# Patient Record
Sex: Male | Born: 1940
Health system: Southern US, Community
[De-identification: ages and names within clinical notes are randomized; demographics above are authoritative.]

## PROBLEM LIST (undated history)

## (undated) DIAGNOSIS — G629 Polyneuropathy, unspecified: Secondary | ICD-10-CM

## (undated) DIAGNOSIS — R011 Cardiac murmur, unspecified: Secondary | ICD-10-CM

## (undated) DIAGNOSIS — D638 Anemia in other chronic diseases classified elsewhere: Secondary | ICD-10-CM

## (undated) DIAGNOSIS — J329 Chronic sinusitis, unspecified: Secondary | ICD-10-CM

## (undated) DIAGNOSIS — E1165 Type 2 diabetes mellitus with hyperglycemia: Secondary | ICD-10-CM

## (undated) DIAGNOSIS — E669 Obesity, unspecified: Secondary | ICD-10-CM

## (undated) DIAGNOSIS — E213 Hyperparathyroidism, unspecified: Secondary | ICD-10-CM

## (undated) DIAGNOSIS — I5030 Unspecified diastolic (congestive) heart failure: Secondary | ICD-10-CM

## (undated) DIAGNOSIS — N189 Chronic kidney disease, unspecified: Secondary | ICD-10-CM

## (undated) DIAGNOSIS — IMO0002 Reserved for concepts with insufficient information to code with codable children: Secondary | ICD-10-CM

## (undated) DIAGNOSIS — I10 Essential (primary) hypertension: Secondary | ICD-10-CM

## (undated) HISTORY — DX: Chronic sinusitis, unspecified: J32.9

## (undated) HISTORY — DX: Polyneuropathy, unspecified: G62.9

## (undated) HISTORY — DX: Anemia in other chronic diseases classified elsewhere: D63.8

## (undated) HISTORY — DX: Obesity, unspecified: E66.9

## (undated) HISTORY — PX: ANKLE SURGERY: SHX546

## (undated) HISTORY — DX: Unspecified diastolic (congestive) heart failure: I50.30

## (undated) HISTORY — DX: Essential (primary) hypertension: I10

## (undated) HISTORY — DX: Hyperparathyroidism, unspecified: E21.3

## (undated) HISTORY — PX: NASAL SINUS SURGERY: SHX719

## (undated) HISTORY — DX: Type 2 diabetes mellitus with hyperglycemia: E11.65

## (undated) HISTORY — PX: VASECTOMY: SHX75

## (undated) HISTORY — DX: Chronic kidney disease, unspecified: N18.9

## (undated) HISTORY — DX: Reserved for concepts with insufficient information to code with codable children: IMO0002

---

## 2011-09-23 DIAGNOSIS — H903 Sensorineural hearing loss, bilateral: Secondary | ICD-10-CM | POA: Diagnosis not present

## 2011-09-23 DIAGNOSIS — H9209 Otalgia, unspecified ear: Secondary | ICD-10-CM | POA: Diagnosis not present

## 2011-09-26 ENCOUNTER — Other Ambulatory Visit: Payer: Self-pay | Admitting: Otolaryngology

## 2011-10-01 ENCOUNTER — Ambulatory Visit
Admission: RE | Admit: 2011-10-01 | Discharge: 2011-10-01 | Disposition: A | Discharge status: Home / Self Care | Payer: Medicare Other | Source: Ambulatory Visit | Attending: Otolaryngology | Admitting: Otolaryngology

## 2011-10-01 DIAGNOSIS — H9190 Unspecified hearing loss, unspecified ear: Secondary | ICD-10-CM | POA: Diagnosis not present

## 2011-10-01 DIAGNOSIS — H9209 Otalgia, unspecified ear: Secondary | ICD-10-CM | POA: Diagnosis not present

## 2011-10-01 MED ORDER — IOHEXOL 300 MG/ML  SOLN
50.0000 mL | Freq: Once | INTRAMUSCULAR | Status: AC | PRN
  Administered 2011-10-01: 50 mL via INTRAVENOUS

## 2011-10-14 DIAGNOSIS — J309 Allergic rhinitis, unspecified: Secondary | ICD-10-CM | POA: Diagnosis not present

## 2011-10-14 DIAGNOSIS — H9209 Otalgia, unspecified ear: Secondary | ICD-10-CM | POA: Diagnosis not present

## 2011-10-14 DIAGNOSIS — H905 Unspecified sensorineural hearing loss: Secondary | ICD-10-CM | POA: Diagnosis not present

## 2011-10-14 DIAGNOSIS — K111 Hypertrophy of salivary gland: Secondary | ICD-10-CM | POA: Diagnosis not present

## 2011-10-21 DIAGNOSIS — H903 Sensorineural hearing loss, bilateral: Secondary | ICD-10-CM | POA: Diagnosis not present

## 2011-10-21 DIAGNOSIS — H9209 Otalgia, unspecified ear: Secondary | ICD-10-CM | POA: Diagnosis not present

## 2011-10-21 DIAGNOSIS — H8109 Meniere's disease, unspecified ear: Secondary | ICD-10-CM | POA: Diagnosis not present

## 2011-10-21 DIAGNOSIS — K119 Disease of salivary gland, unspecified: Secondary | ICD-10-CM | POA: Diagnosis not present

## 2011-11-25 DIAGNOSIS — K119 Disease of salivary gland, unspecified: Secondary | ICD-10-CM | POA: Diagnosis not present

## 2011-11-25 DIAGNOSIS — H8109 Meniere's disease, unspecified ear: Secondary | ICD-10-CM | POA: Diagnosis not present

## 2011-11-25 DIAGNOSIS — H903 Sensorineural hearing loss, bilateral: Secondary | ICD-10-CM | POA: Diagnosis not present

## 2011-11-25 DIAGNOSIS — H9209 Otalgia, unspecified ear: Secondary | ICD-10-CM | POA: Diagnosis not present

## 2012-01-05 DIAGNOSIS — K111 Hypertrophy of salivary gland: Secondary | ICD-10-CM | POA: Diagnosis not present

## 2012-01-05 DIAGNOSIS — J309 Allergic rhinitis, unspecified: Secondary | ICD-10-CM | POA: Diagnosis not present

## 2012-01-19 DIAGNOSIS — S8990XA Unspecified injury of unspecified lower leg, initial encounter: Secondary | ICD-10-CM | POA: Diagnosis not present

## 2012-01-19 DIAGNOSIS — I5030 Unspecified diastolic (congestive) heart failure: Secondary | ICD-10-CM | POA: Diagnosis not present

## 2012-01-19 DIAGNOSIS — K560 Paralytic ileus: Secondary | ICD-10-CM | POA: Diagnosis not present

## 2012-01-19 DIAGNOSIS — N179 Acute kidney failure, unspecified: Secondary | ICD-10-CM | POA: Diagnosis not present

## 2012-01-19 DIAGNOSIS — W010XXA Fall on same level from slipping, tripping and stumbling without subsequent striking against object, initial encounter: Secondary | ICD-10-CM | POA: Diagnosis not present

## 2012-01-19 DIAGNOSIS — J189 Pneumonia, unspecified organism: Secondary | ICD-10-CM | POA: Diagnosis not present

## 2012-01-19 DIAGNOSIS — I509 Heart failure, unspecified: Secondary | ICD-10-CM | POA: Diagnosis not present

## 2012-01-19 DIAGNOSIS — S82853A Displaced trimalleolar fracture of unspecified lower leg, initial encounter for closed fracture: Secondary | ICD-10-CM | POA: Diagnosis not present

## 2012-01-19 DIAGNOSIS — J988 Other specified respiratory disorders: Secondary | ICD-10-CM | POA: Diagnosis not present

## 2012-01-19 DIAGNOSIS — I130 Hypertensive heart and chronic kidney disease with heart failure and stage 1 through stage 4 chronic kidney disease, or unspecified chronic kidney disease: Secondary | ICD-10-CM | POA: Diagnosis not present

## 2012-01-19 DIAGNOSIS — T148XXA Other injury of unspecified body region, initial encounter: Secondary | ICD-10-CM | POA: Diagnosis not present

## 2012-01-20 DIAGNOSIS — N179 Acute kidney failure, unspecified: Secondary | ICD-10-CM | POA: Diagnosis present

## 2012-01-20 DIAGNOSIS — I130 Hypertensive heart and chronic kidney disease with heart failure and stage 1 through stage 4 chronic kidney disease, or unspecified chronic kidney disease: Secondary | ICD-10-CM | POA: Diagnosis present

## 2012-01-20 DIAGNOSIS — R748 Abnormal levels of other serum enzymes: Secondary | ICD-10-CM | POA: Diagnosis present

## 2012-01-20 DIAGNOSIS — I359 Nonrheumatic aortic valve disorder, unspecified: Secondary | ICD-10-CM | POA: Diagnosis not present

## 2012-01-20 DIAGNOSIS — G500 Trigeminal neuralgia: Secondary | ICD-10-CM | POA: Diagnosis present

## 2012-01-20 DIAGNOSIS — E876 Hypokalemia: Secondary | ICD-10-CM | POA: Diagnosis not present

## 2012-01-20 DIAGNOSIS — A419 Sepsis, unspecified organism: Secondary | ICD-10-CM | POA: Diagnosis not present

## 2012-01-20 DIAGNOSIS — E785 Hyperlipidemia, unspecified: Secondary | ICD-10-CM | POA: Diagnosis not present

## 2012-01-20 DIAGNOSIS — R5383 Other fatigue: Secondary | ICD-10-CM | POA: Diagnosis not present

## 2012-01-20 DIAGNOSIS — R197 Diarrhea, unspecified: Secondary | ICD-10-CM | POA: Diagnosis not present

## 2012-01-20 DIAGNOSIS — IMO0001 Reserved for inherently not codable concepts without codable children: Secondary | ICD-10-CM | POA: Diagnosis not present

## 2012-01-20 DIAGNOSIS — I5030 Unspecified diastolic (congestive) heart failure: Secondary | ICD-10-CM | POA: Diagnosis present

## 2012-01-20 DIAGNOSIS — K929 Disease of digestive system, unspecified: Secondary | ICD-10-CM | POA: Diagnosis not present

## 2012-01-20 DIAGNOSIS — S9306XA Dislocation of unspecified ankle joint, initial encounter: Secondary | ICD-10-CM | POA: Diagnosis not present

## 2012-01-20 DIAGNOSIS — I5031 Acute diastolic (congestive) heart failure: Secondary | ICD-10-CM | POA: Diagnosis not present

## 2012-01-20 DIAGNOSIS — I509 Heart failure, unspecified: Secondary | ICD-10-CM | POA: Diagnosis not present

## 2012-01-20 DIAGNOSIS — W010XXA Fall on same level from slipping, tripping and stumbling without subsequent striking against object, initial encounter: Secondary | ICD-10-CM | POA: Diagnosis not present

## 2012-01-20 DIAGNOSIS — N189 Chronic kidney disease, unspecified: Secondary | ICD-10-CM | POA: Diagnosis not present

## 2012-01-20 DIAGNOSIS — Z0389 Encounter for observation for other suspected diseases and conditions ruled out: Secondary | ICD-10-CM | POA: Diagnosis not present

## 2012-01-20 DIAGNOSIS — I501 Left ventricular failure: Secondary | ICD-10-CM | POA: Diagnosis not present

## 2012-01-20 DIAGNOSIS — J189 Pneumonia, unspecified organism: Secondary | ICD-10-CM | POA: Diagnosis not present

## 2012-01-20 DIAGNOSIS — S82853A Displaced trimalleolar fracture of unspecified lower leg, initial encounter for closed fracture: Secondary | ICD-10-CM | POA: Diagnosis not present

## 2012-01-20 DIAGNOSIS — S82899A Other fracture of unspecified lower leg, initial encounter for closed fracture: Secondary | ICD-10-CM | POA: Diagnosis not present

## 2012-01-20 DIAGNOSIS — K5909 Other constipation: Secondary | ICD-10-CM | POA: Diagnosis present

## 2012-01-20 DIAGNOSIS — I119 Hypertensive heart disease without heart failure: Secondary | ICD-10-CM | POA: Diagnosis not present

## 2012-01-20 DIAGNOSIS — I10 Essential (primary) hypertension: Secondary | ICD-10-CM | POA: Diagnosis not present

## 2012-01-20 DIAGNOSIS — R509 Fever, unspecified: Secondary | ICD-10-CM | POA: Diagnosis not present

## 2012-01-20 DIAGNOSIS — R269 Unspecified abnormalities of gait and mobility: Secondary | ICD-10-CM | POA: Diagnosis not present

## 2012-01-20 DIAGNOSIS — J988 Other specified respiratory disorders: Secondary | ICD-10-CM | POA: Diagnosis not present

## 2012-01-20 DIAGNOSIS — M25579 Pain in unspecified ankle and joints of unspecified foot: Secondary | ICD-10-CM | POA: Diagnosis not present

## 2012-01-20 DIAGNOSIS — I369 Nonrheumatic tricuspid valve disorder, unspecified: Secondary | ICD-10-CM | POA: Diagnosis not present

## 2012-01-20 DIAGNOSIS — S1190XA Unspecified open wound of unspecified part of neck, initial encounter: Secondary | ICD-10-CM | POA: Diagnosis not present

## 2012-01-20 DIAGNOSIS — J158 Pneumonia due to other specified bacteria: Secondary | ICD-10-CM | POA: Diagnosis not present

## 2012-01-20 DIAGNOSIS — N183 Chronic kidney disease, stage 3 unspecified: Secondary | ICD-10-CM | POA: Diagnosis not present

## 2012-01-20 DIAGNOSIS — Z8249 Family history of ischemic heart disease and other diseases of the circulatory system: Secondary | ICD-10-CM | POA: Diagnosis not present

## 2012-01-20 DIAGNOSIS — K560 Paralytic ileus: Secondary | ICD-10-CM | POA: Diagnosis not present

## 2012-01-20 HISTORY — PX: ANKLE SURGERY: SHX546

## 2012-01-25 DIAGNOSIS — I10 Essential (primary) hypertension: Secondary | ICD-10-CM | POA: Diagnosis not present

## 2012-01-25 DIAGNOSIS — M25579 Pain in unspecified ankle and joints of unspecified foot: Secondary | ICD-10-CM | POA: Diagnosis not present

## 2012-01-25 DIAGNOSIS — J158 Pneumonia due to other specified bacteria: Secondary | ICD-10-CM | POA: Diagnosis not present

## 2012-01-25 DIAGNOSIS — I119 Hypertensive heart disease without heart failure: Secondary | ICD-10-CM | POA: Diagnosis not present

## 2012-01-25 DIAGNOSIS — E7889 Other lipoprotein metabolism disorders: Secondary | ICD-10-CM | POA: Diagnosis not present

## 2012-01-25 DIAGNOSIS — G500 Trigeminal neuralgia: Secondary | ICD-10-CM | POA: Diagnosis not present

## 2012-01-25 DIAGNOSIS — I5031 Acute diastolic (congestive) heart failure: Secondary | ICD-10-CM | POA: Diagnosis not present

## 2012-01-25 DIAGNOSIS — N19 Unspecified kidney failure: Secondary | ICD-10-CM | POA: Diagnosis not present

## 2012-01-25 DIAGNOSIS — E7800 Pure hypercholesterolemia, unspecified: Secondary | ICD-10-CM | POA: Diagnosis not present

## 2012-01-25 DIAGNOSIS — E785 Hyperlipidemia, unspecified: Secondary | ICD-10-CM | POA: Diagnosis not present

## 2012-01-25 DIAGNOSIS — S82853A Displaced trimalleolar fracture of unspecified lower leg, initial encounter for closed fracture: Secondary | ICD-10-CM | POA: Diagnosis not present

## 2012-01-25 DIAGNOSIS — I5030 Unspecified diastolic (congestive) heart failure: Secondary | ICD-10-CM | POA: Diagnosis not present

## 2012-01-25 DIAGNOSIS — J189 Pneumonia, unspecified organism: Secondary | ICD-10-CM | POA: Diagnosis not present

## 2012-01-25 DIAGNOSIS — E876 Hypokalemia: Secondary | ICD-10-CM | POA: Diagnosis not present

## 2012-01-25 DIAGNOSIS — R5383 Other fatigue: Secondary | ICD-10-CM | POA: Diagnosis not present

## 2012-01-25 DIAGNOSIS — IMO0001 Reserved for inherently not codable concepts without codable children: Secondary | ICD-10-CM | POA: Diagnosis not present

## 2012-01-25 DIAGNOSIS — R269 Unspecified abnormalities of gait and mobility: Secondary | ICD-10-CM | POA: Diagnosis not present

## 2012-01-26 DIAGNOSIS — I10 Essential (primary) hypertension: Secondary | ICD-10-CM | POA: Diagnosis not present

## 2012-01-26 DIAGNOSIS — J189 Pneumonia, unspecified organism: Secondary | ICD-10-CM | POA: Diagnosis not present

## 2012-01-26 DIAGNOSIS — E7800 Pure hypercholesterolemia, unspecified: Secondary | ICD-10-CM | POA: Diagnosis not present

## 2012-01-26 DIAGNOSIS — N19 Unspecified kidney failure: Secondary | ICD-10-CM | POA: Diagnosis not present

## 2012-02-11 DIAGNOSIS — S82853A Displaced trimalleolar fracture of unspecified lower leg, initial encounter for closed fracture: Secondary | ICD-10-CM | POA: Diagnosis not present

## 2012-02-26 DIAGNOSIS — IMO0001 Reserved for inherently not codable concepts without codable children: Secondary | ICD-10-CM | POA: Diagnosis not present

## 2012-02-26 DIAGNOSIS — I509 Heart failure, unspecified: Secondary | ICD-10-CM | POA: Diagnosis not present

## 2012-02-26 DIAGNOSIS — E785 Hyperlipidemia, unspecified: Secondary | ICD-10-CM | POA: Diagnosis not present

## 2012-02-26 DIAGNOSIS — I129 Hypertensive chronic kidney disease with stage 1 through stage 4 chronic kidney disease, or unspecified chronic kidney disease: Secondary | ICD-10-CM | POA: Diagnosis not present

## 2012-03-01 DIAGNOSIS — I129 Hypertensive chronic kidney disease with stage 1 through stage 4 chronic kidney disease, or unspecified chronic kidney disease: Secondary | ICD-10-CM | POA: Diagnosis not present

## 2012-03-01 DIAGNOSIS — E785 Hyperlipidemia, unspecified: Secondary | ICD-10-CM | POA: Diagnosis not present

## 2012-03-01 DIAGNOSIS — I509 Heart failure, unspecified: Secondary | ICD-10-CM | POA: Diagnosis not present

## 2012-03-01 DIAGNOSIS — IMO0001 Reserved for inherently not codable concepts without codable children: Secondary | ICD-10-CM | POA: Diagnosis not present

## 2012-03-03 DIAGNOSIS — I5030 Unspecified diastolic (congestive) heart failure: Secondary | ICD-10-CM | POA: Diagnosis not present

## 2012-03-03 DIAGNOSIS — N289 Disorder of kidney and ureter, unspecified: Secondary | ICD-10-CM | POA: Diagnosis not present

## 2012-03-03 DIAGNOSIS — E7800 Pure hypercholesterolemia, unspecified: Secondary | ICD-10-CM | POA: Diagnosis not present

## 2012-03-03 DIAGNOSIS — S82853A Displaced trimalleolar fracture of unspecified lower leg, initial encounter for closed fracture: Secondary | ICD-10-CM | POA: Diagnosis not present

## 2012-03-03 DIAGNOSIS — I10 Essential (primary) hypertension: Secondary | ICD-10-CM | POA: Diagnosis not present

## 2012-03-04 DIAGNOSIS — E785 Hyperlipidemia, unspecified: Secondary | ICD-10-CM | POA: Diagnosis not present

## 2012-03-04 DIAGNOSIS — IMO0001 Reserved for inherently not codable concepts without codable children: Secondary | ICD-10-CM | POA: Diagnosis not present

## 2012-03-04 DIAGNOSIS — I509 Heart failure, unspecified: Secondary | ICD-10-CM | POA: Diagnosis not present

## 2012-03-04 DIAGNOSIS — I129 Hypertensive chronic kidney disease with stage 1 through stage 4 chronic kidney disease, or unspecified chronic kidney disease: Secondary | ICD-10-CM | POA: Diagnosis not present

## 2012-03-08 DIAGNOSIS — I509 Heart failure, unspecified: Secondary | ICD-10-CM | POA: Diagnosis not present

## 2012-03-08 DIAGNOSIS — I129 Hypertensive chronic kidney disease with stage 1 through stage 4 chronic kidney disease, or unspecified chronic kidney disease: Secondary | ICD-10-CM | POA: Diagnosis not present

## 2012-03-09 DIAGNOSIS — M25579 Pain in unspecified ankle and joints of unspecified foot: Secondary | ICD-10-CM | POA: Diagnosis not present

## 2012-03-10 DIAGNOSIS — Z79899 Other long term (current) drug therapy: Secondary | ICD-10-CM | POA: Diagnosis not present

## 2012-03-12 DIAGNOSIS — M25579 Pain in unspecified ankle and joints of unspecified foot: Secondary | ICD-10-CM | POA: Diagnosis not present

## 2012-03-16 DIAGNOSIS — M25579 Pain in unspecified ankle and joints of unspecified foot: Secondary | ICD-10-CM | POA: Diagnosis not present

## 2012-03-19 DIAGNOSIS — M25579 Pain in unspecified ankle and joints of unspecified foot: Secondary | ICD-10-CM | POA: Diagnosis not present

## 2012-03-23 DIAGNOSIS — D649 Anemia, unspecified: Secondary | ICD-10-CM | POA: Diagnosis not present

## 2012-03-23 DIAGNOSIS — M25579 Pain in unspecified ankle and joints of unspecified foot: Secondary | ICD-10-CM | POA: Diagnosis not present

## 2012-03-23 DIAGNOSIS — N2581 Secondary hyperparathyroidism of renal origin: Secondary | ICD-10-CM | POA: Diagnosis not present

## 2012-03-26 DIAGNOSIS — M25579 Pain in unspecified ankle and joints of unspecified foot: Secondary | ICD-10-CM | POA: Diagnosis not present

## 2012-03-30 DIAGNOSIS — M25579 Pain in unspecified ankle and joints of unspecified foot: Secondary | ICD-10-CM | POA: Diagnosis not present

## 2012-03-31 DIAGNOSIS — S82853A Displaced trimalleolar fracture of unspecified lower leg, initial encounter for closed fracture: Secondary | ICD-10-CM | POA: Diagnosis not present

## 2012-04-01 DIAGNOSIS — M25579 Pain in unspecified ankle and joints of unspecified foot: Secondary | ICD-10-CM | POA: Diagnosis not present

## 2012-04-05 DIAGNOSIS — H903 Sensorineural hearing loss, bilateral: Secondary | ICD-10-CM | POA: Diagnosis not present

## 2012-04-05 DIAGNOSIS — H9209 Otalgia, unspecified ear: Secondary | ICD-10-CM | POA: Diagnosis not present

## 2012-04-05 DIAGNOSIS — H8109 Meniere's disease, unspecified ear: Secondary | ICD-10-CM | POA: Diagnosis not present

## 2012-04-06 DIAGNOSIS — M25579 Pain in unspecified ankle and joints of unspecified foot: Secondary | ICD-10-CM | POA: Diagnosis not present

## 2012-04-09 DIAGNOSIS — M25579 Pain in unspecified ankle and joints of unspecified foot: Secondary | ICD-10-CM | POA: Diagnosis not present

## 2012-04-13 DIAGNOSIS — M25579 Pain in unspecified ankle and joints of unspecified foot: Secondary | ICD-10-CM | POA: Diagnosis not present

## 2012-04-16 DIAGNOSIS — M25579 Pain in unspecified ankle and joints of unspecified foot: Secondary | ICD-10-CM | POA: Diagnosis not present

## 2012-04-26 DIAGNOSIS — M25579 Pain in unspecified ankle and joints of unspecified foot: Secondary | ICD-10-CM | POA: Diagnosis not present

## 2012-04-27 DIAGNOSIS — Z79899 Other long term (current) drug therapy: Secondary | ICD-10-CM | POA: Diagnosis not present

## 2012-04-29 DIAGNOSIS — M25579 Pain in unspecified ankle and joints of unspecified foot: Secondary | ICD-10-CM | POA: Diagnosis not present

## 2012-05-04 DIAGNOSIS — M25579 Pain in unspecified ankle and joints of unspecified foot: Secondary | ICD-10-CM | POA: Diagnosis not present

## 2012-05-07 DIAGNOSIS — M25579 Pain in unspecified ankle and joints of unspecified foot: Secondary | ICD-10-CM | POA: Diagnosis not present

## 2012-05-10 DIAGNOSIS — R609 Edema, unspecified: Secondary | ICD-10-CM | POA: Diagnosis not present

## 2012-05-10 DIAGNOSIS — E7800 Pure hypercholesterolemia, unspecified: Secondary | ICD-10-CM | POA: Diagnosis not present

## 2012-05-10 DIAGNOSIS — E559 Vitamin D deficiency, unspecified: Secondary | ICD-10-CM | POA: Diagnosis not present

## 2012-05-10 DIAGNOSIS — M949 Disorder of cartilage, unspecified: Secondary | ICD-10-CM | POA: Diagnosis not present

## 2012-05-10 DIAGNOSIS — I10 Essential (primary) hypertension: Secondary | ICD-10-CM | POA: Diagnosis not present

## 2012-05-10 DIAGNOSIS — S82899A Other fracture of unspecified lower leg, initial encounter for closed fracture: Secondary | ICD-10-CM | POA: Diagnosis not present

## 2012-05-10 DIAGNOSIS — Z780 Asymptomatic menopausal state: Secondary | ICD-10-CM | POA: Diagnosis not present

## 2012-05-13 DIAGNOSIS — M25579 Pain in unspecified ankle and joints of unspecified foot: Secondary | ICD-10-CM | POA: Diagnosis not present

## 2012-05-20 DIAGNOSIS — M25579 Pain in unspecified ankle and joints of unspecified foot: Secondary | ICD-10-CM | POA: Diagnosis not present

## 2012-05-24 DIAGNOSIS — R609 Edema, unspecified: Secondary | ICD-10-CM | POA: Diagnosis not present

## 2012-05-24 DIAGNOSIS — N189 Chronic kidney disease, unspecified: Secondary | ICD-10-CM | POA: Diagnosis not present

## 2012-05-24 DIAGNOSIS — I519 Heart disease, unspecified: Secondary | ICD-10-CM | POA: Diagnosis not present

## 2012-05-24 DIAGNOSIS — E785 Hyperlipidemia, unspecified: Secondary | ICD-10-CM | POA: Diagnosis not present

## 2012-05-27 DIAGNOSIS — M25579 Pain in unspecified ankle and joints of unspecified foot: Secondary | ICD-10-CM | POA: Diagnosis not present

## 2012-05-31 DIAGNOSIS — I129 Hypertensive chronic kidney disease with stage 1 through stage 4 chronic kidney disease, or unspecified chronic kidney disease: Secondary | ICD-10-CM | POA: Diagnosis not present

## 2012-05-31 DIAGNOSIS — D631 Anemia in chronic kidney disease: Secondary | ICD-10-CM | POA: Diagnosis not present

## 2012-06-03 DIAGNOSIS — M25579 Pain in unspecified ankle and joints of unspecified foot: Secondary | ICD-10-CM | POA: Diagnosis not present

## 2012-06-07 DIAGNOSIS — H903 Sensorineural hearing loss, bilateral: Secondary | ICD-10-CM | POA: Diagnosis not present

## 2012-06-07 DIAGNOSIS — H8109 Meniere's disease, unspecified ear: Secondary | ICD-10-CM | POA: Diagnosis not present

## 2012-06-07 DIAGNOSIS — H9209 Otalgia, unspecified ear: Secondary | ICD-10-CM | POA: Diagnosis not present

## 2012-06-07 DIAGNOSIS — Z23 Encounter for immunization: Secondary | ICD-10-CM | POA: Diagnosis not present

## 2012-06-08 DIAGNOSIS — M25579 Pain in unspecified ankle and joints of unspecified foot: Secondary | ICD-10-CM | POA: Diagnosis not present

## 2012-06-28 DIAGNOSIS — S82853A Displaced trimalleolar fracture of unspecified lower leg, initial encounter for closed fracture: Secondary | ICD-10-CM | POA: Diagnosis not present

## 2012-07-13 DIAGNOSIS — N189 Chronic kidney disease, unspecified: Secondary | ICD-10-CM | POA: Diagnosis not present

## 2012-07-13 DIAGNOSIS — I10 Essential (primary) hypertension: Secondary | ICD-10-CM | POA: Diagnosis not present

## 2012-07-15 DIAGNOSIS — N189 Chronic kidney disease, unspecified: Secondary | ICD-10-CM | POA: Diagnosis not present

## 2012-07-27 DIAGNOSIS — E785 Hyperlipidemia, unspecified: Secondary | ICD-10-CM | POA: Diagnosis not present

## 2012-07-27 DIAGNOSIS — I10 Essential (primary) hypertension: Secondary | ICD-10-CM | POA: Diagnosis not present

## 2012-08-02 DIAGNOSIS — M79609 Pain in unspecified limb: Secondary | ICD-10-CM | POA: Diagnosis not present

## 2012-08-02 DIAGNOSIS — H9209 Otalgia, unspecified ear: Secondary | ICD-10-CM | POA: Diagnosis not present

## 2012-09-06 DIAGNOSIS — H2510 Age-related nuclear cataract, unspecified eye: Secondary | ICD-10-CM | POA: Diagnosis not present

## 2012-09-13 DIAGNOSIS — I10 Essential (primary) hypertension: Secondary | ICD-10-CM | POA: Diagnosis not present

## 2012-09-20 DIAGNOSIS — I10 Essential (primary) hypertension: Secondary | ICD-10-CM | POA: Diagnosis not present

## 2012-11-08 DIAGNOSIS — R609 Edema, unspecified: Secondary | ICD-10-CM | POA: Diagnosis not present

## 2012-11-08 DIAGNOSIS — I10 Essential (primary) hypertension: Secondary | ICD-10-CM | POA: Diagnosis not present

## 2012-11-08 DIAGNOSIS — N189 Chronic kidney disease, unspecified: Secondary | ICD-10-CM | POA: Diagnosis not present

## 2012-11-08 DIAGNOSIS — E785 Hyperlipidemia, unspecified: Secondary | ICD-10-CM | POA: Diagnosis not present

## 2012-11-15 DIAGNOSIS — L570 Actinic keratosis: Secondary | ICD-10-CM | POA: Diagnosis not present

## 2012-11-15 DIAGNOSIS — L821 Other seborrheic keratosis: Secondary | ICD-10-CM | POA: Diagnosis not present

## 2012-12-03 DIAGNOSIS — J0100 Acute maxillary sinusitis, unspecified: Secondary | ICD-10-CM | POA: Diagnosis not present

## 2013-01-10 DIAGNOSIS — L570 Actinic keratosis: Secondary | ICD-10-CM | POA: Diagnosis not present

## 2013-01-10 DIAGNOSIS — L821 Other seborrheic keratosis: Secondary | ICD-10-CM | POA: Diagnosis not present

## 2013-01-10 DIAGNOSIS — L578 Other skin changes due to chronic exposure to nonionizing radiation: Secondary | ICD-10-CM | POA: Diagnosis not present

## 2013-01-31 DIAGNOSIS — M25579 Pain in unspecified ankle and joints of unspecified foot: Secondary | ICD-10-CM | POA: Diagnosis not present

## 2013-01-31 DIAGNOSIS — E7800 Pure hypercholesterolemia, unspecified: Secondary | ICD-10-CM | POA: Diagnosis not present

## 2013-01-31 DIAGNOSIS — I10 Essential (primary) hypertension: Secondary | ICD-10-CM | POA: Diagnosis not present

## 2013-01-31 DIAGNOSIS — Z0000 Encounter for general adult medical examination without abnormal findings: Secondary | ICD-10-CM | POA: Diagnosis not present

## 2013-01-31 DIAGNOSIS — D649 Anemia, unspecified: Secondary | ICD-10-CM | POA: Diagnosis not present

## 2013-04-25 DIAGNOSIS — T8489XA Other specified complication of internal orthopedic prosthetic devices, implants and grafts, initial encounter: Secondary | ICD-10-CM | POA: Diagnosis not present

## 2013-05-10 DIAGNOSIS — Z8349 Family history of other endocrine, nutritional and metabolic diseases: Secondary | ICD-10-CM | POA: Diagnosis not present

## 2013-05-10 DIAGNOSIS — Z130 Encounter for screening for diseases of the blood and blood-forming organs and certain disorders involving the immune mechanism: Secondary | ICD-10-CM | POA: Diagnosis not present

## 2013-05-10 DIAGNOSIS — I129 Hypertensive chronic kidney disease with stage 1 through stage 4 chronic kidney disease, or unspecified chronic kidney disease: Secondary | ICD-10-CM | POA: Diagnosis not present

## 2013-05-13 DIAGNOSIS — N289 Disorder of kidney and ureter, unspecified: Secondary | ICD-10-CM | POA: Diagnosis not present

## 2013-05-13 DIAGNOSIS — E7800 Pure hypercholesterolemia, unspecified: Secondary | ICD-10-CM | POA: Diagnosis not present

## 2013-05-13 DIAGNOSIS — I10 Essential (primary) hypertension: Secondary | ICD-10-CM | POA: Diagnosis not present

## 2013-05-13 DIAGNOSIS — E559 Vitamin D deficiency, unspecified: Secondary | ICD-10-CM | POA: Diagnosis not present

## 2013-05-24 DIAGNOSIS — E785 Hyperlipidemia, unspecified: Secondary | ICD-10-CM | POA: Diagnosis not present

## 2013-05-24 DIAGNOSIS — I129 Hypertensive chronic kidney disease with stage 1 through stage 4 chronic kidney disease, or unspecified chronic kidney disease: Secondary | ICD-10-CM | POA: Diagnosis not present

## 2013-05-24 DIAGNOSIS — I10 Essential (primary) hypertension: Secondary | ICD-10-CM | POA: Diagnosis not present

## 2013-05-24 DIAGNOSIS — R609 Edema, unspecified: Secondary | ICD-10-CM | POA: Diagnosis not present

## 2013-05-24 DIAGNOSIS — N189 Chronic kidney disease, unspecified: Secondary | ICD-10-CM | POA: Diagnosis not present

## 2013-05-27 DIAGNOSIS — N3289 Other specified disorders of bladder: Secondary | ICD-10-CM | POA: Diagnosis not present

## 2013-05-27 DIAGNOSIS — I700 Atherosclerosis of aorta: Secondary | ICD-10-CM | POA: Diagnosis not present

## 2013-05-27 DIAGNOSIS — R935 Abnormal findings on diagnostic imaging of other abdominal regions, including retroperitoneum: Secondary | ICD-10-CM | POA: Diagnosis not present

## 2013-06-07 DIAGNOSIS — T8489XA Other specified complication of internal orthopedic prosthetic devices, implants and grafts, initial encounter: Secondary | ICD-10-CM | POA: Diagnosis not present

## 2013-06-07 DIAGNOSIS — M19079 Primary osteoarthritis, unspecified ankle and foot: Secondary | ICD-10-CM | POA: Diagnosis not present

## 2013-06-07 DIAGNOSIS — T84498A Other mechanical complication of other internal orthopedic devices, implants and grafts, initial encounter: Secondary | ICD-10-CM | POA: Diagnosis not present

## 2013-06-07 DIAGNOSIS — G8918 Other acute postprocedural pain: Secondary | ICD-10-CM | POA: Diagnosis not present

## 2013-06-07 DIAGNOSIS — Y831 Surgical operation with implant of artificial internal device as the cause of abnormal reaction of the patient, or of later complication, without mention of misadventure at the time of the procedure: Secondary | ICD-10-CM | POA: Diagnosis not present

## 2013-06-13 DIAGNOSIS — Z23 Encounter for immunization: Secondary | ICD-10-CM | POA: Diagnosis not present

## 2013-07-11 DIAGNOSIS — L570 Actinic keratosis: Secondary | ICD-10-CM | POA: Diagnosis not present

## 2013-08-29 DIAGNOSIS — T84498A Other mechanical complication of other internal orthopedic devices, implants and grafts, initial encounter: Secondary | ICD-10-CM | POA: Diagnosis not present

## 2013-08-29 DIAGNOSIS — Z9889 Other specified postprocedural states: Secondary | ICD-10-CM | POA: Diagnosis not present

## 2013-09-12 DIAGNOSIS — IMO0001 Reserved for inherently not codable concepts without codable children: Secondary | ICD-10-CM | POA: Diagnosis not present

## 2013-09-12 DIAGNOSIS — M25579 Pain in unspecified ankle and joints of unspecified foot: Secondary | ICD-10-CM | POA: Diagnosis not present

## 2013-09-12 DIAGNOSIS — M6281 Muscle weakness (generalized): Secondary | ICD-10-CM | POA: Diagnosis not present

## 2013-09-12 DIAGNOSIS — M25673 Stiffness of unspecified ankle, not elsewhere classified: Secondary | ICD-10-CM | POA: Diagnosis not present

## 2013-09-12 DIAGNOSIS — R269 Unspecified abnormalities of gait and mobility: Secondary | ICD-10-CM | POA: Diagnosis not present

## 2013-09-14 DIAGNOSIS — M25579 Pain in unspecified ankle and joints of unspecified foot: Secondary | ICD-10-CM | POA: Diagnosis not present

## 2013-09-14 DIAGNOSIS — IMO0001 Reserved for inherently not codable concepts without codable children: Secondary | ICD-10-CM | POA: Diagnosis not present

## 2013-09-14 DIAGNOSIS — M6281 Muscle weakness (generalized): Secondary | ICD-10-CM | POA: Diagnosis not present

## 2013-09-14 DIAGNOSIS — R269 Unspecified abnormalities of gait and mobility: Secondary | ICD-10-CM | POA: Diagnosis not present

## 2013-09-14 DIAGNOSIS — M25673 Stiffness of unspecified ankle, not elsewhere classified: Secondary | ICD-10-CM | POA: Diagnosis not present

## 2013-09-19 DIAGNOSIS — M25579 Pain in unspecified ankle and joints of unspecified foot: Secondary | ICD-10-CM | POA: Diagnosis not present

## 2013-09-19 DIAGNOSIS — M25676 Stiffness of unspecified foot, not elsewhere classified: Secondary | ICD-10-CM | POA: Diagnosis not present

## 2013-09-19 DIAGNOSIS — M6281 Muscle weakness (generalized): Secondary | ICD-10-CM | POA: Diagnosis not present

## 2013-09-19 DIAGNOSIS — M25673 Stiffness of unspecified ankle, not elsewhere classified: Secondary | ICD-10-CM | POA: Diagnosis not present

## 2013-09-19 DIAGNOSIS — R269 Unspecified abnormalities of gait and mobility: Secondary | ICD-10-CM | POA: Diagnosis not present

## 2013-09-19 DIAGNOSIS — IMO0001 Reserved for inherently not codable concepts without codable children: Secondary | ICD-10-CM | POA: Diagnosis not present

## 2013-09-21 DIAGNOSIS — M25676 Stiffness of unspecified foot, not elsewhere classified: Secondary | ICD-10-CM | POA: Diagnosis not present

## 2013-09-21 DIAGNOSIS — R269 Unspecified abnormalities of gait and mobility: Secondary | ICD-10-CM | POA: Diagnosis not present

## 2013-09-21 DIAGNOSIS — M6281 Muscle weakness (generalized): Secondary | ICD-10-CM | POA: Diagnosis not present

## 2013-09-21 DIAGNOSIS — M25673 Stiffness of unspecified ankle, not elsewhere classified: Secondary | ICD-10-CM | POA: Diagnosis not present

## 2013-09-21 DIAGNOSIS — IMO0001 Reserved for inherently not codable concepts without codable children: Secondary | ICD-10-CM | POA: Diagnosis not present

## 2013-09-21 DIAGNOSIS — M25579 Pain in unspecified ankle and joints of unspecified foot: Secondary | ICD-10-CM | POA: Diagnosis not present

## 2013-09-26 DIAGNOSIS — M25673 Stiffness of unspecified ankle, not elsewhere classified: Secondary | ICD-10-CM | POA: Diagnosis not present

## 2013-09-26 DIAGNOSIS — M6281 Muscle weakness (generalized): Secondary | ICD-10-CM | POA: Diagnosis not present

## 2013-09-26 DIAGNOSIS — M25579 Pain in unspecified ankle and joints of unspecified foot: Secondary | ICD-10-CM | POA: Diagnosis not present

## 2013-09-26 DIAGNOSIS — M25676 Stiffness of unspecified foot, not elsewhere classified: Secondary | ICD-10-CM | POA: Diagnosis not present

## 2013-09-26 DIAGNOSIS — IMO0001 Reserved for inherently not codable concepts without codable children: Secondary | ICD-10-CM | POA: Diagnosis not present

## 2013-09-26 DIAGNOSIS — R269 Unspecified abnormalities of gait and mobility: Secondary | ICD-10-CM | POA: Diagnosis not present

## 2013-09-28 DIAGNOSIS — M25579 Pain in unspecified ankle and joints of unspecified foot: Secondary | ICD-10-CM | POA: Diagnosis not present

## 2013-09-28 DIAGNOSIS — IMO0001 Reserved for inherently not codable concepts without codable children: Secondary | ICD-10-CM | POA: Diagnosis not present

## 2013-09-28 DIAGNOSIS — M25673 Stiffness of unspecified ankle, not elsewhere classified: Secondary | ICD-10-CM | POA: Diagnosis not present

## 2013-09-28 DIAGNOSIS — M25676 Stiffness of unspecified foot, not elsewhere classified: Secondary | ICD-10-CM | POA: Diagnosis not present

## 2013-09-28 DIAGNOSIS — R269 Unspecified abnormalities of gait and mobility: Secondary | ICD-10-CM | POA: Diagnosis not present

## 2013-09-28 DIAGNOSIS — M6281 Muscle weakness (generalized): Secondary | ICD-10-CM | POA: Diagnosis not present

## 2013-10-03 DIAGNOSIS — M6281 Muscle weakness (generalized): Secondary | ICD-10-CM | POA: Diagnosis not present

## 2013-10-03 DIAGNOSIS — R269 Unspecified abnormalities of gait and mobility: Secondary | ICD-10-CM | POA: Diagnosis not present

## 2013-10-03 DIAGNOSIS — M25673 Stiffness of unspecified ankle, not elsewhere classified: Secondary | ICD-10-CM | POA: Diagnosis not present

## 2013-10-03 DIAGNOSIS — IMO0001 Reserved for inherently not codable concepts without codable children: Secondary | ICD-10-CM | POA: Diagnosis not present

## 2013-10-03 DIAGNOSIS — M25579 Pain in unspecified ankle and joints of unspecified foot: Secondary | ICD-10-CM | POA: Diagnosis not present

## 2013-10-10 DIAGNOSIS — R269 Unspecified abnormalities of gait and mobility: Secondary | ICD-10-CM | POA: Diagnosis not present

## 2013-10-10 DIAGNOSIS — M25673 Stiffness of unspecified ankle, not elsewhere classified: Secondary | ICD-10-CM | POA: Diagnosis not present

## 2013-10-10 DIAGNOSIS — M25579 Pain in unspecified ankle and joints of unspecified foot: Secondary | ICD-10-CM | POA: Diagnosis not present

## 2013-10-10 DIAGNOSIS — IMO0001 Reserved for inherently not codable concepts without codable children: Secondary | ICD-10-CM | POA: Diagnosis not present

## 2013-10-10 DIAGNOSIS — M6281 Muscle weakness (generalized): Secondary | ICD-10-CM | POA: Diagnosis not present

## 2013-11-03 DIAGNOSIS — IMO0001 Reserved for inherently not codable concepts without codable children: Secondary | ICD-10-CM | POA: Diagnosis not present

## 2013-11-03 DIAGNOSIS — M6281 Muscle weakness (generalized): Secondary | ICD-10-CM | POA: Diagnosis not present

## 2013-11-03 DIAGNOSIS — M25673 Stiffness of unspecified ankle, not elsewhere classified: Secondary | ICD-10-CM | POA: Diagnosis not present

## 2013-11-03 DIAGNOSIS — M25579 Pain in unspecified ankle and joints of unspecified foot: Secondary | ICD-10-CM | POA: Diagnosis not present

## 2013-11-03 DIAGNOSIS — R269 Unspecified abnormalities of gait and mobility: Secondary | ICD-10-CM | POA: Diagnosis not present

## 2013-11-07 DIAGNOSIS — T84498A Other mechanical complication of other internal orthopedic devices, implants and grafts, initial encounter: Secondary | ICD-10-CM | POA: Diagnosis not present

## 2014-05-09 DIAGNOSIS — L565 Disseminated superficial actinic porokeratosis (DSAP): Secondary | ICD-10-CM | POA: Diagnosis not present

## 2014-05-09 DIAGNOSIS — D485 Neoplasm of uncertain behavior of skin: Secondary | ICD-10-CM | POA: Diagnosis not present

## 2014-05-09 DIAGNOSIS — C44711 Basal cell carcinoma of skin of unspecified lower limb, including hip: Secondary | ICD-10-CM | POA: Diagnosis not present

## 2014-05-09 DIAGNOSIS — L570 Actinic keratosis: Secondary | ICD-10-CM | POA: Diagnosis not present

## 2014-05-09 DIAGNOSIS — L821 Other seborrheic keratosis: Secondary | ICD-10-CM | POA: Diagnosis not present

## 2014-05-19 DIAGNOSIS — N289 Disorder of kidney and ureter, unspecified: Secondary | ICD-10-CM | POA: Diagnosis not present

## 2014-05-19 DIAGNOSIS — Z23 Encounter for immunization: Secondary | ICD-10-CM | POA: Diagnosis not present

## 2014-05-19 DIAGNOSIS — I10 Essential (primary) hypertension: Secondary | ICD-10-CM | POA: Diagnosis not present

## 2014-05-19 DIAGNOSIS — E7800 Pure hypercholesterolemia, unspecified: Secondary | ICD-10-CM | POA: Diagnosis not present

## 2014-05-19 DIAGNOSIS — J329 Chronic sinusitis, unspecified: Secondary | ICD-10-CM | POA: Diagnosis not present

## 2014-05-19 DIAGNOSIS — D649 Anemia, unspecified: Secondary | ICD-10-CM | POA: Diagnosis not present

## 2014-05-19 DIAGNOSIS — D518 Other vitamin B12 deficiency anemias: Secondary | ICD-10-CM | POA: Diagnosis not present

## 2014-05-19 DIAGNOSIS — Z0000 Encounter for general adult medical examination without abnormal findings: Secondary | ICD-10-CM | POA: Diagnosis not present

## 2014-06-01 DIAGNOSIS — Z23 Encounter for immunization: Secondary | ICD-10-CM | POA: Diagnosis not present

## 2014-07-10 DIAGNOSIS — L821 Other seborrheic keratosis: Secondary | ICD-10-CM | POA: Diagnosis not present

## 2014-07-10 DIAGNOSIS — L570 Actinic keratosis: Secondary | ICD-10-CM | POA: Diagnosis not present

## 2014-07-10 DIAGNOSIS — Z85828 Personal history of other malignant neoplasm of skin: Secondary | ICD-10-CM | POA: Diagnosis not present

## 2014-09-18 DIAGNOSIS — M19071 Primary osteoarthritis, right ankle and foot: Secondary | ICD-10-CM | POA: Diagnosis not present

## 2014-11-08 DIAGNOSIS — N2581 Secondary hyperparathyroidism of renal origin: Secondary | ICD-10-CM | POA: Diagnosis not present

## 2014-11-08 DIAGNOSIS — N183 Chronic kidney disease, stage 3 (moderate): Secondary | ICD-10-CM | POA: Diagnosis not present

## 2015-05-23 DIAGNOSIS — D509 Iron deficiency anemia, unspecified: Secondary | ICD-10-CM | POA: Diagnosis not present

## 2015-05-23 DIAGNOSIS — I10 Essential (primary) hypertension: Secondary | ICD-10-CM | POA: Diagnosis not present

## 2015-05-23 DIAGNOSIS — Z23 Encounter for immunization: Secondary | ICD-10-CM | POA: Diagnosis not present

## 2015-05-23 DIAGNOSIS — J301 Allergic rhinitis due to pollen: Secondary | ICD-10-CM | POA: Diagnosis not present

## 2015-05-23 DIAGNOSIS — E780 Pure hypercholesterolemia: Secondary | ICD-10-CM | POA: Diagnosis not present

## 2015-05-23 DIAGNOSIS — Z1389 Encounter for screening for other disorder: Secondary | ICD-10-CM | POA: Diagnosis not present

## 2015-05-23 DIAGNOSIS — Z79899 Other long term (current) drug therapy: Secondary | ICD-10-CM | POA: Diagnosis not present

## 2015-05-23 DIAGNOSIS — E559 Vitamin D deficiency, unspecified: Secondary | ICD-10-CM | POA: Diagnosis not present

## 2015-06-11 DIAGNOSIS — L821 Other seborrheic keratosis: Secondary | ICD-10-CM | POA: Diagnosis not present

## 2015-06-11 DIAGNOSIS — Z85828 Personal history of other malignant neoplasm of skin: Secondary | ICD-10-CM | POA: Diagnosis not present

## 2015-06-11 DIAGNOSIS — L570 Actinic keratosis: Secondary | ICD-10-CM | POA: Diagnosis not present

## 2015-07-26 DIAGNOSIS — E162 Hypoglycemia, unspecified: Secondary | ICD-10-CM | POA: Diagnosis not present

## 2015-08-06 DIAGNOSIS — Z86010 Personal history of colonic polyps: Secondary | ICD-10-CM | POA: Diagnosis not present

## 2015-08-06 DIAGNOSIS — K5730 Diverticulosis of large intestine without perforation or abscess without bleeding: Secondary | ICD-10-CM | POA: Diagnosis not present

## 2015-10-01 DIAGNOSIS — M19071 Primary osteoarthritis, right ankle and foot: Secondary | ICD-10-CM | POA: Diagnosis not present

## 2015-12-20 DIAGNOSIS — Z85828 Personal history of other malignant neoplasm of skin: Secondary | ICD-10-CM | POA: Diagnosis not present

## 2015-12-20 DIAGNOSIS — L821 Other seborrheic keratosis: Secondary | ICD-10-CM | POA: Diagnosis not present

## 2015-12-20 DIAGNOSIS — L570 Actinic keratosis: Secondary | ICD-10-CM | POA: Diagnosis not present

## 2015-12-20 DIAGNOSIS — B353 Tinea pedis: Secondary | ICD-10-CM | POA: Diagnosis not present

## 2016-01-07 DIAGNOSIS — N183 Chronic kidney disease, stage 3 (moderate): Secondary | ICD-10-CM | POA: Diagnosis not present

## 2016-01-07 DIAGNOSIS — N2581 Secondary hyperparathyroidism of renal origin: Secondary | ICD-10-CM | POA: Diagnosis not present

## 2016-02-14 DIAGNOSIS — E785 Hyperlipidemia, unspecified: Secondary | ICD-10-CM | POA: Diagnosis not present

## 2016-02-14 DIAGNOSIS — Z0000 Encounter for general adult medical examination without abnormal findings: Secondary | ICD-10-CM | POA: Diagnosis not present

## 2016-02-14 DIAGNOSIS — I10 Essential (primary) hypertension: Secondary | ICD-10-CM | POA: Diagnosis not present

## 2016-03-24 DIAGNOSIS — M19171 Post-traumatic osteoarthritis, right ankle and foot: Secondary | ICD-10-CM | POA: Diagnosis not present

## 2016-05-26 DIAGNOSIS — Z79899 Other long term (current) drug therapy: Secondary | ICD-10-CM | POA: Diagnosis not present

## 2016-05-26 DIAGNOSIS — G629 Polyneuropathy, unspecified: Secondary | ICD-10-CM | POA: Diagnosis not present

## 2016-05-26 DIAGNOSIS — M545 Low back pain: Secondary | ICD-10-CM | POA: Diagnosis not present

## 2016-05-26 DIAGNOSIS — N189 Chronic kidney disease, unspecified: Secondary | ICD-10-CM | POA: Diagnosis not present

## 2016-05-26 DIAGNOSIS — Z9181 History of falling: Secondary | ICD-10-CM | POA: Diagnosis not present

## 2016-05-26 DIAGNOSIS — E785 Hyperlipidemia, unspecified: Secondary | ICD-10-CM | POA: Diagnosis not present

## 2016-05-26 DIAGNOSIS — D638 Anemia in other chronic diseases classified elsewhere: Secondary | ICD-10-CM | POA: Diagnosis not present

## 2016-05-26 DIAGNOSIS — Z0000 Encounter for general adult medical examination without abnormal findings: Secondary | ICD-10-CM | POA: Diagnosis not present

## 2016-06-02 DIAGNOSIS — D485 Neoplasm of uncertain behavior of skin: Secondary | ICD-10-CM | POA: Diagnosis not present

## 2016-06-02 DIAGNOSIS — B079 Viral wart, unspecified: Secondary | ICD-10-CM | POA: Diagnosis not present

## 2016-06-02 DIAGNOSIS — L812 Freckles: Secondary | ICD-10-CM | POA: Diagnosis not present

## 2016-06-02 DIAGNOSIS — L570 Actinic keratosis: Secondary | ICD-10-CM | POA: Diagnosis not present

## 2016-06-02 DIAGNOSIS — L820 Inflamed seborrheic keratosis: Secondary | ICD-10-CM | POA: Diagnosis not present

## 2016-06-02 DIAGNOSIS — Z85828 Personal history of other malignant neoplasm of skin: Secondary | ICD-10-CM | POA: Diagnosis not present

## 2016-07-21 DIAGNOSIS — H903 Sensorineural hearing loss, bilateral: Secondary | ICD-10-CM | POA: Diagnosis not present

## 2016-10-09 DIAGNOSIS — M19171 Post-traumatic osteoarthritis, right ankle and foot: Secondary | ICD-10-CM | POA: Diagnosis not present

## 2016-11-03 DIAGNOSIS — M79671 Pain in right foot: Secondary | ICD-10-CM | POA: Diagnosis not present

## 2016-12-01 DIAGNOSIS — D485 Neoplasm of uncertain behavior of skin: Secondary | ICD-10-CM | POA: Diagnosis not present

## 2016-12-01 DIAGNOSIS — L812 Freckles: Secondary | ICD-10-CM | POA: Diagnosis not present

## 2016-12-01 DIAGNOSIS — L918 Other hypertrophic disorders of the skin: Secondary | ICD-10-CM | POA: Diagnosis not present

## 2016-12-01 DIAGNOSIS — Z85828 Personal history of other malignant neoplasm of skin: Secondary | ICD-10-CM | POA: Diagnosis not present

## 2016-12-01 DIAGNOSIS — L821 Other seborrheic keratosis: Secondary | ICD-10-CM | POA: Diagnosis not present

## 2016-12-01 DIAGNOSIS — D0472 Carcinoma in situ of skin of left lower limb, including hip: Secondary | ICD-10-CM | POA: Diagnosis not present

## 2016-12-01 DIAGNOSIS — L570 Actinic keratosis: Secondary | ICD-10-CM | POA: Diagnosis not present

## 2016-12-01 DIAGNOSIS — L565 Disseminated superficial actinic porokeratosis (DSAP): Secondary | ICD-10-CM | POA: Diagnosis not present

## 2017-01-14 DIAGNOSIS — N183 Chronic kidney disease, stage 3 (moderate): Secondary | ICD-10-CM | POA: Diagnosis not present

## 2017-01-14 DIAGNOSIS — N2581 Secondary hyperparathyroidism of renal origin: Secondary | ICD-10-CM | POA: Diagnosis not present

## 2017-02-23 DIAGNOSIS — M19171 Post-traumatic osteoarthritis, right ankle and foot: Secondary | ICD-10-CM | POA: Diagnosis not present

## 2017-05-29 DIAGNOSIS — Z23 Encounter for immunization: Secondary | ICD-10-CM | POA: Diagnosis not present

## 2017-06-08 DIAGNOSIS — L821 Other seborrheic keratosis: Secondary | ICD-10-CM | POA: Diagnosis not present

## 2017-06-08 DIAGNOSIS — L570 Actinic keratosis: Secondary | ICD-10-CM | POA: Diagnosis not present

## 2017-06-08 DIAGNOSIS — L565 Disseminated superficial actinic porokeratosis (DSAP): Secondary | ICD-10-CM | POA: Diagnosis not present

## 2017-06-08 DIAGNOSIS — Z85828 Personal history of other malignant neoplasm of skin: Secondary | ICD-10-CM | POA: Diagnosis not present

## 2017-06-18 DIAGNOSIS — Z0000 Encounter for general adult medical examination without abnormal findings: Secondary | ICD-10-CM | POA: Diagnosis not present

## 2017-06-18 DIAGNOSIS — Z79899 Other long term (current) drug therapy: Secondary | ICD-10-CM | POA: Diagnosis not present

## 2017-06-18 DIAGNOSIS — J301 Allergic rhinitis due to pollen: Secondary | ICD-10-CM | POA: Diagnosis not present

## 2017-06-18 DIAGNOSIS — D519 Vitamin B12 deficiency anemia, unspecified: Secondary | ICD-10-CM | POA: Diagnosis not present

## 2017-06-18 DIAGNOSIS — E785 Hyperlipidemia, unspecified: Secondary | ICD-10-CM | POA: Diagnosis not present

## 2017-06-18 DIAGNOSIS — I10 Essential (primary) hypertension: Secondary | ICD-10-CM | POA: Diagnosis not present

## 2017-06-18 DIAGNOSIS — G629 Polyneuropathy, unspecified: Secondary | ICD-10-CM | POA: Diagnosis not present

## 2017-06-18 DIAGNOSIS — Z6829 Body mass index (BMI) 29.0-29.9, adult: Secondary | ICD-10-CM | POA: Diagnosis not present

## 2017-09-01 DIAGNOSIS — M19171 Post-traumatic osteoarthritis, right ankle and foot: Secondary | ICD-10-CM | POA: Diagnosis not present

## 2017-09-10 DIAGNOSIS — Z85828 Personal history of other malignant neoplasm of skin: Secondary | ICD-10-CM | POA: Diagnosis not present

## 2017-09-10 DIAGNOSIS — L821 Other seborrheic keratosis: Secondary | ICD-10-CM | POA: Diagnosis not present

## 2017-09-14 DIAGNOSIS — L820 Inflamed seborrheic keratosis: Secondary | ICD-10-CM | POA: Diagnosis not present

## 2017-09-14 DIAGNOSIS — Z85828 Personal history of other malignant neoplasm of skin: Secondary | ICD-10-CM | POA: Diagnosis not present

## 2017-09-14 DIAGNOSIS — D485 Neoplasm of uncertain behavior of skin: Secondary | ICD-10-CM | POA: Diagnosis not present

## 2017-12-14 DIAGNOSIS — L570 Actinic keratosis: Secondary | ICD-10-CM | POA: Diagnosis not present

## 2017-12-14 DIAGNOSIS — Z85828 Personal history of other malignant neoplasm of skin: Secondary | ICD-10-CM | POA: Diagnosis not present

## 2017-12-14 DIAGNOSIS — D1801 Hemangioma of skin and subcutaneous tissue: Secondary | ICD-10-CM | POA: Diagnosis not present

## 2017-12-14 DIAGNOSIS — L821 Other seborrheic keratosis: Secondary | ICD-10-CM | POA: Diagnosis not present

## 2017-12-21 DIAGNOSIS — I129 Hypertensive chronic kidney disease with stage 1 through stage 4 chronic kidney disease, or unspecified chronic kidney disease: Secondary | ICD-10-CM | POA: Diagnosis not present

## 2017-12-21 DIAGNOSIS — N2581 Secondary hyperparathyroidism of renal origin: Secondary | ICD-10-CM | POA: Diagnosis not present

## 2017-12-21 DIAGNOSIS — N183 Chronic kidney disease, stage 3 (moderate): Secondary | ICD-10-CM | POA: Diagnosis not present

## 2018-03-24 DIAGNOSIS — M19171 Post-traumatic osteoarthritis, right ankle and foot: Secondary | ICD-10-CM | POA: Diagnosis not present

## 2018-03-26 DIAGNOSIS — N410 Acute prostatitis: Secondary | ICD-10-CM | POA: Diagnosis not present

## 2018-03-26 DIAGNOSIS — Z79899 Other long term (current) drug therapy: Secondary | ICD-10-CM | POA: Diagnosis not present

## 2018-03-26 DIAGNOSIS — Z6827 Body mass index (BMI) 27.0-27.9, adult: Secondary | ICD-10-CM | POA: Diagnosis not present

## 2018-03-26 DIAGNOSIS — E1165 Type 2 diabetes mellitus with hyperglycemia: Secondary | ICD-10-CM | POA: Diagnosis not present

## 2018-03-26 DIAGNOSIS — E785 Hyperlipidemia, unspecified: Secondary | ICD-10-CM | POA: Diagnosis not present

## 2018-03-26 DIAGNOSIS — R358 Other polyuria: Secondary | ICD-10-CM | POA: Diagnosis not present

## 2018-03-29 DIAGNOSIS — N189 Chronic kidney disease, unspecified: Secondary | ICD-10-CM | POA: Diagnosis not present

## 2018-03-29 DIAGNOSIS — Z6827 Body mass index (BMI) 27.0-27.9, adult: Secondary | ICD-10-CM | POA: Diagnosis not present

## 2018-03-29 DIAGNOSIS — E1165 Type 2 diabetes mellitus with hyperglycemia: Secondary | ICD-10-CM | POA: Diagnosis not present

## 2018-03-29 DIAGNOSIS — R358 Other polyuria: Secondary | ICD-10-CM | POA: Diagnosis not present

## 2018-03-29 DIAGNOSIS — N289 Disorder of kidney and ureter, unspecified: Secondary | ICD-10-CM | POA: Diagnosis not present

## 2018-03-31 DIAGNOSIS — E860 Dehydration: Secondary | ICD-10-CM | POA: Diagnosis not present

## 2018-03-31 DIAGNOSIS — R112 Nausea with vomiting, unspecified: Secondary | ICD-10-CM | POA: Diagnosis not present

## 2018-03-31 DIAGNOSIS — R51 Headache: Secondary | ICD-10-CM | POA: Diagnosis not present

## 2018-03-31 DIAGNOSIS — E871 Hypo-osmolality and hyponatremia: Secondary | ICD-10-CM | POA: Diagnosis not present

## 2018-03-31 DIAGNOSIS — R1013 Epigastric pain: Secondary | ICD-10-CM | POA: Diagnosis not present

## 2018-03-31 DIAGNOSIS — R109 Unspecified abdominal pain: Secondary | ICD-10-CM | POA: Diagnosis not present

## 2018-03-31 DIAGNOSIS — R1011 Right upper quadrant pain: Secondary | ICD-10-CM | POA: Diagnosis not present

## 2018-04-01 DIAGNOSIS — N183 Chronic kidney disease, stage 3 (moderate): Secondary | ICD-10-CM | POA: Diagnosis not present

## 2018-04-01 DIAGNOSIS — E871 Hypo-osmolality and hyponatremia: Secondary | ICD-10-CM | POA: Diagnosis not present

## 2018-04-01 DIAGNOSIS — R51 Headache: Secondary | ICD-10-CM | POA: Diagnosis not present

## 2018-04-01 DIAGNOSIS — K5900 Constipation, unspecified: Secondary | ICD-10-CM | POA: Diagnosis present

## 2018-04-01 DIAGNOSIS — K30 Functional dyspepsia: Secondary | ICD-10-CM | POA: Diagnosis not present

## 2018-04-01 DIAGNOSIS — I10 Essential (primary) hypertension: Secondary | ICD-10-CM | POA: Diagnosis not present

## 2018-04-01 DIAGNOSIS — R109 Unspecified abdominal pain: Secondary | ICD-10-CM | POA: Diagnosis not present

## 2018-04-01 DIAGNOSIS — I129 Hypertensive chronic kidney disease with stage 1 through stage 4 chronic kidney disease, or unspecified chronic kidney disease: Secondary | ICD-10-CM | POA: Diagnosis present

## 2018-04-01 DIAGNOSIS — E119 Type 2 diabetes mellitus without complications: Secondary | ICD-10-CM | POA: Diagnosis not present

## 2018-04-01 DIAGNOSIS — E860 Dehydration: Secondary | ICD-10-CM | POA: Diagnosis not present

## 2018-04-01 DIAGNOSIS — R112 Nausea with vomiting, unspecified: Secondary | ICD-10-CM | POA: Diagnosis not present

## 2018-04-01 DIAGNOSIS — E1122 Type 2 diabetes mellitus with diabetic chronic kidney disease: Secondary | ICD-10-CM | POA: Diagnosis present

## 2018-04-01 DIAGNOSIS — E785 Hyperlipidemia, unspecified: Secondary | ICD-10-CM | POA: Diagnosis not present

## 2018-04-01 DIAGNOSIS — Z6827 Body mass index (BMI) 27.0-27.9, adult: Secondary | ICD-10-CM | POA: Diagnosis not present

## 2018-04-01 DIAGNOSIS — Z794 Long term (current) use of insulin: Secondary | ICD-10-CM | POA: Diagnosis not present

## 2018-04-01 DIAGNOSIS — Z888 Allergy status to other drugs, medicaments and biological substances status: Secondary | ICD-10-CM | POA: Diagnosis not present

## 2018-04-01 DIAGNOSIS — R634 Abnormal weight loss: Secondary | ICD-10-CM | POA: Diagnosis present

## 2018-04-01 DIAGNOSIS — K219 Gastro-esophageal reflux disease without esophagitis: Secondary | ICD-10-CM | POA: Diagnosis not present

## 2018-04-01 DIAGNOSIS — E1165 Type 2 diabetes mellitus with hyperglycemia: Secondary | ICD-10-CM | POA: Diagnosis present

## 2018-04-01 DIAGNOSIS — D631 Anemia in chronic kidney disease: Secondary | ICD-10-CM | POA: Diagnosis present

## 2018-04-01 DIAGNOSIS — R1013 Epigastric pain: Secondary | ICD-10-CM | POA: Diagnosis not present

## 2018-04-01 DIAGNOSIS — Z79899 Other long term (current) drug therapy: Secondary | ICD-10-CM | POA: Diagnosis not present

## 2018-04-01 DIAGNOSIS — E876 Hypokalemia: Secondary | ICD-10-CM | POA: Diagnosis not present

## 2018-04-01 DIAGNOSIS — D638 Anemia in other chronic diseases classified elsewhere: Secondary | ICD-10-CM | POA: Diagnosis not present

## 2018-04-01 DIAGNOSIS — Z7982 Long term (current) use of aspirin: Secondary | ICD-10-CM | POA: Diagnosis not present

## 2018-04-01 DIAGNOSIS — R531 Weakness: Secondary | ICD-10-CM | POA: Diagnosis not present

## 2018-04-01 DIAGNOSIS — R1011 Right upper quadrant pain: Secondary | ICD-10-CM | POA: Diagnosis not present

## 2018-04-05 DIAGNOSIS — E1165 Type 2 diabetes mellitus with hyperglycemia: Secondary | ICD-10-CM | POA: Diagnosis not present

## 2018-04-05 DIAGNOSIS — Z6827 Body mass index (BMI) 27.0-27.9, adult: Secondary | ICD-10-CM | POA: Diagnosis not present

## 2018-04-05 DIAGNOSIS — R1084 Generalized abdominal pain: Secondary | ICD-10-CM | POA: Diagnosis not present

## 2018-04-05 DIAGNOSIS — N189 Chronic kidney disease, unspecified: Secondary | ICD-10-CM | POA: Diagnosis not present

## 2018-04-05 DIAGNOSIS — R5381 Other malaise: Secondary | ICD-10-CM | POA: Diagnosis not present

## 2018-04-07 DIAGNOSIS — D638 Anemia in other chronic diseases classified elsewhere: Secondary | ICD-10-CM | POA: Diagnosis not present

## 2018-04-07 DIAGNOSIS — E1165 Type 2 diabetes mellitus with hyperglycemia: Secondary | ICD-10-CM | POA: Diagnosis not present

## 2018-04-09 DIAGNOSIS — Z1211 Encounter for screening for malignant neoplasm of colon: Secondary | ICD-10-CM | POA: Diagnosis not present

## 2018-04-14 ENCOUNTER — Other Ambulatory Visit: Payer: Self-pay

## 2018-04-16 ENCOUNTER — Other Ambulatory Visit: Payer: Self-pay

## 2018-04-26 DIAGNOSIS — Z1331 Encounter for screening for depression: Secondary | ICD-10-CM | POA: Diagnosis not present

## 2018-04-26 DIAGNOSIS — E1165 Type 2 diabetes mellitus with hyperglycemia: Secondary | ICD-10-CM | POA: Diagnosis not present

## 2018-04-26 DIAGNOSIS — Z1339 Encounter for screening examination for other mental health and behavioral disorders: Secondary | ICD-10-CM | POA: Diagnosis not present

## 2018-04-26 DIAGNOSIS — Z9181 History of falling: Secondary | ICD-10-CM | POA: Diagnosis not present

## 2018-04-27 ENCOUNTER — Encounter: Payer: Self-pay | Admitting: *Deleted

## 2018-05-05 NOTE — Progress Notes (Signed)
Cardiology Office Note:    Date:  05/06/2018   ID:  Jason Beltran, DOB [birth year], MRN [id number]  PCP:  Angelina Sheriff, MD  Cardiologist:  Shirlee More, MD    Referring MD: Angelina Sheriff, MD    ASSESSMENT:    1. Type 2 diabetes mellitus with complication, with long-term current use of insulin ([place])   2. Essential hypertension   3. Chronic kidney disease, unspecified CKD stage    PLAN:    In order of problems listed above:  1. Improved at risk for heart failure prior to intensifying Actos he will undergo echocardiogram to define systolic and diastolic function. 2. Stable blood pressure target continue current regimen beta-blocker and vasodilator 3. Stable managed by [facility] 4. He will continue his statin for hyperlipidemia   Next appointment: As needed   Medication Adjustments/Labs and Tests Ordered: Current medicines are reviewed at length with the patient today.  Concerns regarding medicines are outlined above.  Orders Placed This Encounter  Procedures  . ECHOCARDIOGRAM COMPLETE   No orders of the defined types were placed in this encounter.   Chief Complaint  Patient presents with  . New Patient (Initial Visit)    I am here for a cardiac echo  . Hypertension  . Chronic Kidney Disease  . Diabetes    History of Present Illness:    Jason Beltran is a 77 y.o. male with a hx of hypertension diabetes and chronic kidney disease last seen more than 3 years ago.  At that time he had edema related to calcium channel blocker uncontrolled hypertension concerns of heart failure but in the end did not carry the diagnosis.  He was recently admitted to [facility] 04/01/2018 discharge 04/02/2018 with diabetes out-of-control blood sugars outpatient greater than 600 hyponatremia volume contraction and abdominal pain and nausea.  He was hydrated during the hospitalization diabetes controlled and discharged.  During that hospitalization a troponin  was normal proBNP level was low level serum creatinine was 1.5 serum sodium at the time of discharge 135 hemoglobin 10.9.  An EKG at [facility] was normal he has no specific cardiovascular diagnoses or abnormalities noted.  The patient is somewhat frustrated and tells me he is in my office today to have  a cardiac echo performed.  He tells me the reason he is here today as he desperately wants to be off insulin and seeing me and having a cardiac echo was part of the process of understanding whether discontinuing insulin is an option.  He has no background history of heart failure he is having no edema orthopnea chest pain or palpitation he has intermittent swelling around the right ankle where he had a remote trimalleolar fracture.  He did not have specific cardiac testing done in the hospital and I am going to stop and give a call to his primary care physician and discussed the case with him before he leaves.  Compliance with diet, lifestyle and medications: Yes although earlier this week after conversation with his PCP office he discontinued insulin.  He has had no shortness of breath chest pain palpitation or syncope he does develop minor edema at the right ankle which he attributes to his previous trimalleolar fracture it comes and goes Past Medical History:  Diagnosis Date  . Anemia of chronic disease   . Chronic sinusitis   . CKD (chronic kidney disease)   . Diabetes mellitus type 2, uncontrolled ([place])   . Diastolic CHF ([place])   .  Hyperparathyroidism ([place])   . Hypertension   . Neuropathy, peripheral   . Obesity     Past Surgical History:  Procedure Laterality Date  . ANKLE SURGERY    . NASAL SINUS SURGERY    . VASECTOMY      Current Medications: Current Meds  Medication Sig  . aspirin 81 MG EC tablet Take 81 mg by mouth daily. Swallow whole.  Marland Kitchen BIOTIN 5000 PO Take 1 tablet by mouth daily.  . carvedilol (COREG) 6.25 MG tablet Take 6.25 mg by mouth 2 (two) times daily with a  meal.  . Coenzyme Q10 (COQ10) 100 MG CAPS Take 1 capsule by mouth daily.  Marland Kitchen gabapentin (NEURONTIN) 400 MG capsule Take 400 mg by mouth 2 (two) times daily.   . Glucosamine-Chondroit-Vit C-Mn (GLUCOSAMINE 1500 COMPLEX) CAPS Take 1 capsule by mouth daily.  . hydrALAZINE (APRESOLINE) 25 MG tablet Take 25 mg by mouth 2 (two) times daily.   . Insulin Glargine (LANTUS) 100 UNIT/ML Solostar Pen Inject into the skin daily.  Marland Kitchen linagliptin (TRADJENTA) 5 MG TABS tablet Take 5 mg by mouth daily.  . Multiple Vitamin (MULTIVITAMIN) tablet Take 1 tablet by mouth daily.  . Omega-3 Fatty Acids (FISH OIL) 1200 MG CAPS Take 1,200 mg by mouth 2 (two) times daily.  . pioglitazone (ACTOS) 15 MG tablet Take 15 mg by mouth daily.  . Saw Palmetto 450 MG CAPS Take 3 capsules by mouth daily.  . simvastatin (ZOCOR) 20 MG tablet Take 20 mg by mouth daily.  . vitamin C (ASCORBIC ACID) 500 MG tablet Take 500 mg by mouth daily.     Allergies:   Motrin [ibuprofen]   Social History   Socioeconomic History  . Marital status: Married    Spouse name: Not on file  . Number of children: Not on file  . Years of education: Not on file  . Highest education level: Not on file  Occupational History  . Not on file  Social Needs  . Financial resource strain: Not on file  . Food insecurity:    Worry: Not on file    Inability: Not on file  . Transportation needs:    Medical: Not on file    Non-medical: Not on file  Tobacco Use  . Smoking status: Never Smoker  . Smokeless tobacco: Never Used  Substance and Sexual Activity  . Alcohol use: Yes    Comment: occasional glass of wine  . Drug use: Never  . Sexual activity: Not on file  Lifestyle  . Physical activity:    Days per week: Not on file    Minutes per session: Not on file  . Stress: Not on file  Relationships  . Social connections:    Talks on phone: Not on file    Gets together: Not on file    Attends religious service: Not on file    Active member of club  or organization: Not on file    Attends meetings of clubs or organizations: Not on file    Relationship status: Not on file  Other Topics Concern  . Not on file  Social History Narrative  . Not on file     Family History: The patient's family history includes Heart failure in his father; Stroke in his paternal aunt and paternal grandmother. ROS:  Review of Systems  Constitution: Negative.  HENT: Negative.   Eyes: Negative.   Cardiovascular: Positive for leg swelling.  Respiratory: Negative.   Endocrine: Negative.   Hematologic/Lymphatic: Negative.  Skin: Negative.   Musculoskeletal: Negative.   Gastrointestinal: Negative.   Genitourinary: Negative.   Neurological: Negative.   Psychiatric/Behavioral: Negative.   Allergic/Immunologic: Negative.    Please see the history of present illness.    All other systems reviewed and are negative.  EKGs/Labs/Other Studies Reviewed:    The following studies were reviewed today:  EKG: The ekg ordered  demonstrates at [facility] sinus rhythm normal  Recent Labs: Creatinine 1.40 at hospital discharge No results found for requested labs within last 8760 hours.  Recent Lipid Panel No results found for: CHOL, TRIG, HDL, CHOLHDL, VLDL, LDLCALC, LDLDIRECT  Physical Exam:    VS:  BP 138/72 (BP Location: Left Arm, Patient Position: Sitting, Cuff Size: Normal)   Pulse (!) 59   Ht 5\' 8"  (1.727 m)   Wt 178 lb 6.4 oz (80.9 kg)   SpO2 96%   BMI 27.13 kg/m     Wt Readings from Last 3 Encounters:  05/06/18 178 lb 6.4 oz (80.9 kg)     GEN:  Well nourished, well developed in no acute distress HEENT: Normal NECK: No JVD; No carotid bruits LYMPHATICS: No lymphadenopathy CARDIAC: RRR, no murmurs, rubs, gallops RESPIRATORY:  Clear to auscultation without rales, wheezing or rhonchi  ABDOMEN: Soft, non-tender, non-distended MUSCULOSKELETAL:  No edema; No deformity  SKIN: Warm and dry NEUROLOGIC:  Alert and oriented x 3 PSYCHIATRIC:   Normal affect    Signed, Shirlee More, MD  05/06/2018 12:57 PM    [facility]

## 2018-05-06 ENCOUNTER — Ambulatory Visit (INDEPENDENT_AMBULATORY_CARE_PROVIDER_SITE_OTHER): Payer: Medicare Other | Admitting: Cardiology

## 2018-05-06 ENCOUNTER — Ambulatory Visit (INDEPENDENT_AMBULATORY_CARE_PROVIDER_SITE_OTHER): Payer: Medicare Other

## 2018-05-06 ENCOUNTER — Encounter: Payer: Self-pay | Admitting: Cardiology

## 2018-05-06 DIAGNOSIS — I10 Essential (primary) hypertension: Secondary | ICD-10-CM

## 2018-05-06 DIAGNOSIS — N189 Chronic kidney disease, unspecified: Secondary | ICD-10-CM

## 2018-05-06 DIAGNOSIS — E118 Type 2 diabetes mellitus with unspecified complications: Secondary | ICD-10-CM | POA: Diagnosis not present

## 2018-05-06 DIAGNOSIS — Z794 Long term (current) use of insulin: Secondary | ICD-10-CM

## 2018-05-06 DIAGNOSIS — E119 Type 2 diabetes mellitus without complications: Secondary | ICD-10-CM | POA: Insufficient documentation

## 2018-05-06 DIAGNOSIS — N185 Chronic kidney disease, stage 5: Secondary | ICD-10-CM | POA: Insufficient documentation

## 2018-05-06 HISTORY — DX: Type 2 diabetes mellitus without complications: E11.9

## 2018-05-06 HISTORY — DX: Essential (primary) hypertension: I10

## 2018-05-06 HISTORY — DX: Chronic kidney disease, stage 5: N18.5

## 2018-05-06 NOTE — Patient Instructions (Addendum)
Medication Instructions:  Your physician recommends that you continue on your current medications as directed. Please refer to the Current Medication list given to you today.   Labwork: NONE  Testing/Procedures: Your physician has requested that you have an echocardiogram. Echocardiography is a painless test that uses sound waves to create images of your heart. It provides your doctor with information about the size and shape of your heart and how well your heart's chambers and valves are working. This procedure takes approximately one hour. There are no restrictions for this procedure.    Follow-Up: Your physician recommends that you schedule a follow-up appointment as needed or if symptoms worsen or fail to improve.  Any Other Special Instructions Will Be Listed Below (If Applicable).     If you need a refill on your cardiac medications before your next appointment, please call your pharmacy.

## 2018-05-06 NOTE — Progress Notes (Signed)
Complete echocardiogram has been performed.  Jimmy Shaneka Efaw RDCS 

## 2018-05-08 DIAGNOSIS — I5189 Other ill-defined heart diseases: Secondary | ICD-10-CM | POA: Insufficient documentation

## 2018-05-08 DIAGNOSIS — I350 Nonrheumatic aortic (valve) stenosis: Secondary | ICD-10-CM | POA: Insufficient documentation

## 2018-05-08 DIAGNOSIS — I5030 Unspecified diastolic (congestive) heart failure: Secondary | ICD-10-CM | POA: Insufficient documentation

## 2018-05-08 HISTORY — DX: Nonrheumatic aortic (valve) stenosis: I35.0

## 2018-05-08 HISTORY — DX: Other ill-defined heart diseases: I51.89

## 2018-05-08 NOTE — Progress Notes (Signed)
Cardiology Office Note:    Date:  05/08/2018   ID:  Jason Beltran, DOB 03-Feb-1941, MRN [id number]  PCP:  Angelina Sheriff, MD  Cardiologist:  Shirlee More, MD    Referring MD: Angelina Sheriff, MD    ASSESSMENT:    1. Nonrheumatic aortic valve stenosis   2. Diastolic dysfunction   3. Type 2 diabetes mellitus with complication, without long-term current use of insulin (HCC)    PLAN:    In order of problems listed above:  1. Very mild is seen in the office 1 year recheck echocardiogram I do not think over the intermediate 3 to 5-year time that aortic stenosis will be a clinical problem 2. Stable he has no evidence of heart failure but a my opinion is abnormal diastolic filling puts him at risk and should avoid TZD, Actos I have discussed with his PCP 3. Improved his blood sugars are running less than 130 and a single agent.  He is no longer taking insulin is quite relieved   Next appointment: 1 year   Medication Adjustments/Labs and Tests Ordered: Current medicines are reviewed at length with the patient today.  Concerns regarding medicines are outlined above.  No orders of the defined types were placed in this encounter.  No orders of the defined types were placed in this encounter.   No chief complaint on file.   History of Present Illness:    Jason Beltran is a 77 y.o. male with a hx of Type 2 DM and hypertension last week  last seen last week. Compliance with diet, lifestyle and medications: Yes  He feels much better he feels in control of his life blood sugar in range following diet and has no weakness shortness of breath chest pain palpitation or syncope.  He was worried he had severe heart disease I reviewed his echocardiogram with trivial valvular regurgitation very mild aortic stenosis and normal left ventricular function with pseudo-normal diastolic function.  As discussed he will avoid TCD agents Past Medical History:  Diagnosis Date  . Anemia of chronic  disease   . Chronic sinusitis   . CKD (chronic kidney disease)   . Diabetes mellitus type 2, uncontrolled ([place])   . Diastolic CHF ([place])   . Hyperparathyroidism ([place])   . Hypertension   . Neuropathy, peripheral   . Obesity     Past Surgical History:  Procedure Laterality Date  . ANKLE SURGERY    . NASAL SINUS SURGERY    . VASECTOMY      Current Medications: No outpatient medications have been marked as taking for the 05/10/18 encounter (Appointment) with Richardo Priest, MD.     Allergies:   Motrin [ibuprofen]   Social History   Socioeconomic History  . Marital status: Married    Spouse name: Not on file  . Number of children: Not on file  . Years of education: Not on file  . Highest education level: Not on file  Occupational History  . Not on file  Social Needs  . Financial resource strain: Not on file  . Food insecurity:    Worry: Not on file    Inability: Not on file  . Transportation needs:    Medical: Not on file    Non-medical: Not on file  Tobacco Use  . Smoking status: Never Smoker  . Smokeless tobacco: Never Used  Substance and Sexual Activity  . Alcohol use: Yes    Comment: occasional glass of wine  . Drug  use: Never  . Sexual activity: Not on file  Lifestyle  . Physical activity:    Days per week: Not on file    Minutes per session: Not on file  . Stress: Not on file  Relationships  . Social connections:    Talks on phone: Not on file    Gets together: Not on file    Attends religious service: Not on file    Active member of club or organization: Not on file    Attends meetings of clubs or organizations: Not on file    Relationship status: Not on file  Other Topics Concern  . Not on file  Social History Narrative  . Not on file     Family History: The patient's family history includes Heart failure in his father; Stroke in his paternal aunt and paternal grandmother. ROS:   Please see the history of present illness.    All other systems  reviewed and are negative.  EKGs/Labs/Other Studies Reviewed:    The following studies were reviewed today:  Recent Labs: No results found for requested labs within last 8760 hours.  Recent Lipid Panel No results found for: CHOL, TRIG, HDL, CHOLHDL, VLDL, LDLCALC, LDLDIRECT  Physical Exam:    VS:  There were no vitals taken for this visit.    Wt Readings from Last 3 Encounters:  05/06/18 178 lb 6.4 oz (80.9 kg)     GEN:  Well nourished, well developed in no acute distress HEENT: Normal NECK: No JVD; No carotid bruits LYMPHATICS: No lymphadenopathy CARDIAC: RRR, no murmurs, rubs, gallops RESPIRATORY:  Clear to auscultation without rales, wheezing or rhonchi  ABDOMEN: Soft, non-tender, non-distended MUSCULOSKELETAL:  No edema; No deformity  SKIN: Warm and dry NEUROLOGIC:  Alert and oriented x 3 PSYCHIATRIC:  Normal affect    Signed, Shirlee More, MD  05/08/2018 2:10 PM    [facility]

## 2018-05-10 ENCOUNTER — Encounter: Payer: Self-pay | Admitting: Cardiology

## 2018-05-10 ENCOUNTER — Ambulatory Visit (INDEPENDENT_AMBULATORY_CARE_PROVIDER_SITE_OTHER): Payer: Medicare Other | Admitting: Cardiology

## 2018-05-10 VITALS — BP 134/58 | HR 58 | Ht 68.0 in | Wt 179.0 lb

## 2018-05-10 DIAGNOSIS — I350 Nonrheumatic aortic (valve) stenosis: Secondary | ICD-10-CM

## 2018-05-10 DIAGNOSIS — I5189 Other ill-defined heart diseases: Secondary | ICD-10-CM | POA: Diagnosis not present

## 2018-05-10 DIAGNOSIS — E118 Type 2 diabetes mellitus with unspecified complications: Secondary | ICD-10-CM | POA: Diagnosis not present

## 2018-05-10 LAB — ECHOCARDIOGRAM COMPLETE
HEIGHTINCHES: 68 in
Weight: 2854.4 oz

## 2018-05-10 NOTE — Patient Instructions (Addendum)
Medication Instructions:  Your physician recommends that you continue on your current medications as directed. Please refer to the Current Medication list given to you today.   Labwork: None.  Testing/Procedures: None.  Follow-Up: Your physician wants you to follow-up in: 1 year. You will receive a reminder letter in the mail two months in advance. If you don't receive a letter, please call our office to schedule the follow-up appointment.   Any Other Special Instructions Will Be Listed Below (If Applicable).     If you need a refill on your cardiac medications before your next appointment, please call your pharmacy.        Aortic Valve Stenosis Aortic valve stenosis is a narrowing of the aortic valve. The aortic valve opens and closes to regulate blood flow between the lower left chamber of the heart (left ventricle) and the blood vessel that leads away from the heart (aorta). When the aortic valve becomes narrow, it makes it difficult for the heart to pump blood into the aorta, which causes the heart to work harder. The extra work can weaken the heart over time. Aortic valve stenosis can range from mild to severe. If untreated, it can become more severe over time and can lead to heart failure. What are the causes? This condition may be caused by:  Buildup of calcium around and on the valve. This can occur with aging. This is the most common cause of aortic valve stenosis.  Birth defect.  Rheumatic fever.  Radiation to the chest.  What increases the risk? You may be more likely to develop this condition if:  You are over the age of 74.  You were born with an abnormal bicuspid valve.  What are the signs or symptoms? You may have no symptoms until your condition becomes severe. It may take 10-20 years for mild or moderate aortic valve stenosis to become severe. Symptoms may include:  Shortness of breath. This may get worse during physical activity.  Feeling  unusually weak and tired (fatigue).  Extreme discomfort in the chest, neck, or arm (angina).  A heartbeat that is irregular or faster than normal (palpitations).  Dizziness or fainting. This may happen when you get physically tired or after you take certain heart medicines, such as nitroglycerin.  How is this diagnosed? This condition may be diagnosed with:  A physical exam.  Echocardiogram. This is a type of imaging test that uses sound waves (ultrasound) to make an image of your heart. There are two types that may be used: ? Transthoracic echocardiogram (TTE). This type of echocardiogram is noninvasive, and it is usually done first. ? Transesophageal echocardiogram (TEE). This type of echocardiogram is done by passing a flexible tube down your esophagus. The heart and the esophagus are close to each other, so your health care provider can take very clear, detailed pictures of the heart using this type of test.  Cardiac catheterization. In this procedure, a thin, flexible tube (catheter) is passed through a large vein in your neck, groin, or arm. This procedure provides information about arteries, structures, blood pressure, and oxygen levels in your heart.  Electrocardiogram (ECG). This records the electrical impulses of your heart and assesses heart function.  Stress tests. These are tests that evaluate the blood supply to your heart and your heart's response to exercise.  Blood tests.  You may work with a health care provider who specializes in the heart (cardiologist). How is this treated? Treatment depends on how severe your condition is and  what your symptoms are. You will need to have your heart checked regularly to make sure that your condition is not getting worse or causing serious problems. If your condition is mild, no treatment may be needed. Treatment may include:  Medicines that help keep your heart rate regular.  Medicines that thin your blood (anticoagulants) to  prevent the formation of blood clots.  Antibiotic medicines to help prevent infection.  Surgery to replace your aortic valve. This is the most common treatment for aortic valve stenosis. Several types of surgeries are available. The surgery may be done through a large incision over your heart (open heart surgery), or it may be done using a minimally invasive technique (transcatheter aortic valve replacement, or TAVR).  Follow these instructions at home: Lifestyle   Limit alcohol intake to no more than 1 drink per day for nonpregnant women and 2 drinks per day for men. One drink equals 12 oz of beer, 5 oz of wine, or 1 oz of hard liquor.  Do not use any tobacco products, such as cigarettes, chewing tobacco, or e-cigarettes. If you need help quitting, ask your health care provider.  Work with your health care provider to manage your blood pressure and cholesterol.  Maintain a healthy weight. Eating and drinking  Follow instructions from your health care provider about eating or drinking restrictions. ? Limit how much caffeine you drink. Caffeine can affect your heart's rate and rhythm.  Drink enough fluid to keep your urine clear or pale yellow.  Eat a heart-healthy diet. This should include plenty of fresh fruits and vegetables. If you eat meat, it should be lean cuts. Avoid foods that are: ? High in salt, saturated fat, or sugar. ? Canned or highly processed. ? Fried. Activity  Return to your normal activities as told by your health care provider. Ask your health care provider what activities are safe for you.  Exercise regularly, as told by your health care provider. Ask your health care provider what types of exercise are safe for you.  If your aortic valve stenosis is mild, you may need to avoid only very intense physical activity. The more severe your aortic valve stenosis is, the more activities you may need to avoid. General instructions  Take over-the-counter and  prescription medicines only as told by your health care provider.  If you are a woman and you plan to become pregnant, talk with your health care provider before you become pregnant.  Tell all health care providers who care for you that you have aortic valve stenosis.  Keep all follow-up visits as told by your health care provider. This is important. Contact a health care provider if:  You have a fever. Get help right away if:  You develop chest pain or tightness.  You develop shortness of breath or difficulty breathing.  You feel light-headed.  You feel like you might faint.  Your heartbeat is irregular or faster than normal. These symptoms may represent a serious problem that is an emergency. Do not wait to see if the symptoms will go away. Get medical help right away. Call your local emergency services (911 in the [place] Do not drive yourself to the hospital. This information is not intended to replace advice given to you by your health care provider. Make sure you discuss any questions you have with your health care provider. Document Released: 05/31/2003 Document Revised: 02/07/2016 Document Reviewed: 08/05/2015 Elsevier Interactive Patient Education  2017 [employer].

## 2018-05-20 DIAGNOSIS — E1165 Type 2 diabetes mellitus with hyperglycemia: Secondary | ICD-10-CM | POA: Diagnosis not present

## 2018-05-24 DIAGNOSIS — E1165 Type 2 diabetes mellitus with hyperglycemia: Secondary | ICD-10-CM | POA: Diagnosis not present

## 2018-05-24 DIAGNOSIS — I10 Essential (primary) hypertension: Secondary | ICD-10-CM | POA: Diagnosis not present

## 2018-05-24 DIAGNOSIS — E7800 Pure hypercholesterolemia, unspecified: Secondary | ICD-10-CM | POA: Diagnosis not present

## 2018-05-24 DIAGNOSIS — Z6827 Body mass index (BMI) 27.0-27.9, adult: Secondary | ICD-10-CM | POA: Diagnosis not present

## 2018-05-24 DIAGNOSIS — Z23 Encounter for immunization: Secondary | ICD-10-CM | POA: Diagnosis not present

## 2018-06-10 DIAGNOSIS — L821 Other seborrheic keratosis: Secondary | ICD-10-CM | POA: Diagnosis not present

## 2018-06-10 DIAGNOSIS — L570 Actinic keratosis: Secondary | ICD-10-CM | POA: Diagnosis not present

## 2018-06-10 DIAGNOSIS — Z85828 Personal history of other malignant neoplasm of skin: Secondary | ICD-10-CM | POA: Diagnosis not present

## 2018-06-10 DIAGNOSIS — L820 Inflamed seborrheic keratosis: Secondary | ICD-10-CM | POA: Diagnosis not present

## 2018-06-10 DIAGNOSIS — L812 Freckles: Secondary | ICD-10-CM | POA: Diagnosis not present

## 2018-06-10 DIAGNOSIS — B078 Other viral warts: Secondary | ICD-10-CM | POA: Diagnosis not present

## 2018-06-10 DIAGNOSIS — L565 Disseminated superficial actinic porokeratosis (DSAP): Secondary | ICD-10-CM | POA: Diagnosis not present

## 2018-06-10 DIAGNOSIS — D485 Neoplasm of uncertain behavior of skin: Secondary | ICD-10-CM | POA: Diagnosis not present

## 2018-07-26 DIAGNOSIS — M9904 Segmental and somatic dysfunction of sacral region: Secondary | ICD-10-CM | POA: Diagnosis not present

## 2018-07-26 DIAGNOSIS — S336XXA Sprain of sacroiliac joint, initial encounter: Secondary | ICD-10-CM | POA: Diagnosis not present

## 2018-07-26 DIAGNOSIS — S39012A Strain of muscle, fascia and tendon of lower back, initial encounter: Secondary | ICD-10-CM | POA: Diagnosis not present

## 2018-07-26 DIAGNOSIS — S29019A Strain of muscle and tendon of unspecified wall of thorax, initial encounter: Secondary | ICD-10-CM | POA: Diagnosis not present

## 2018-07-26 DIAGNOSIS — M9902 Segmental and somatic dysfunction of thoracic region: Secondary | ICD-10-CM | POA: Diagnosis not present

## 2018-07-26 DIAGNOSIS — M545 Low back pain: Secondary | ICD-10-CM | POA: Diagnosis not present

## 2018-07-26 DIAGNOSIS — M9903 Segmental and somatic dysfunction of lumbar region: Secondary | ICD-10-CM | POA: Diagnosis not present

## 2018-07-27 DIAGNOSIS — M545 Low back pain: Secondary | ICD-10-CM | POA: Diagnosis not present

## 2018-07-27 DIAGNOSIS — S29019A Strain of muscle and tendon of unspecified wall of thorax, initial encounter: Secondary | ICD-10-CM | POA: Diagnosis not present

## 2018-07-27 DIAGNOSIS — S39012A Strain of muscle, fascia and tendon of lower back, initial encounter: Secondary | ICD-10-CM | POA: Diagnosis not present

## 2018-07-27 DIAGNOSIS — M9902 Segmental and somatic dysfunction of thoracic region: Secondary | ICD-10-CM | POA: Diagnosis not present

## 2018-07-27 DIAGNOSIS — S336XXA Sprain of sacroiliac joint, initial encounter: Secondary | ICD-10-CM | POA: Diagnosis not present

## 2018-07-27 DIAGNOSIS — M9904 Segmental and somatic dysfunction of sacral region: Secondary | ICD-10-CM | POA: Diagnosis not present

## 2018-07-27 DIAGNOSIS — M9903 Segmental and somatic dysfunction of lumbar region: Secondary | ICD-10-CM | POA: Diagnosis not present

## 2018-07-28 DIAGNOSIS — S29019A Strain of muscle and tendon of unspecified wall of thorax, initial encounter: Secondary | ICD-10-CM | POA: Diagnosis not present

## 2018-07-28 DIAGNOSIS — M9902 Segmental and somatic dysfunction of thoracic region: Secondary | ICD-10-CM | POA: Diagnosis not present

## 2018-07-28 DIAGNOSIS — M9903 Segmental and somatic dysfunction of lumbar region: Secondary | ICD-10-CM | POA: Diagnosis not present

## 2018-07-28 DIAGNOSIS — S39012A Strain of muscle, fascia and tendon of lower back, initial encounter: Secondary | ICD-10-CM | POA: Diagnosis not present

## 2018-07-28 DIAGNOSIS — M545 Low back pain: Secondary | ICD-10-CM | POA: Diagnosis not present

## 2018-07-28 DIAGNOSIS — S336XXA Sprain of sacroiliac joint, initial encounter: Secondary | ICD-10-CM | POA: Diagnosis not present

## 2018-07-28 DIAGNOSIS — M9904 Segmental and somatic dysfunction of sacral region: Secondary | ICD-10-CM | POA: Diagnosis not present

## 2018-07-30 ENCOUNTER — Other Ambulatory Visit: Payer: Self-pay

## 2018-08-02 DIAGNOSIS — M545 Low back pain: Secondary | ICD-10-CM | POA: Diagnosis not present

## 2018-08-02 DIAGNOSIS — N183 Chronic kidney disease, stage 3 (moderate): Secondary | ICD-10-CM | POA: Diagnosis not present

## 2018-08-02 DIAGNOSIS — M9903 Segmental and somatic dysfunction of lumbar region: Secondary | ICD-10-CM | POA: Diagnosis not present

## 2018-08-02 DIAGNOSIS — I5189 Other ill-defined heart diseases: Secondary | ICD-10-CM | POA: Diagnosis not present

## 2018-08-02 DIAGNOSIS — E1122 Type 2 diabetes mellitus with diabetic chronic kidney disease: Secondary | ICD-10-CM | POA: Diagnosis not present

## 2018-08-02 DIAGNOSIS — M9902 Segmental and somatic dysfunction of thoracic region: Secondary | ICD-10-CM | POA: Diagnosis not present

## 2018-08-02 DIAGNOSIS — I129 Hypertensive chronic kidney disease with stage 1 through stage 4 chronic kidney disease, or unspecified chronic kidney disease: Secondary | ICD-10-CM | POA: Diagnosis not present

## 2018-08-02 DIAGNOSIS — S29019A Strain of muscle and tendon of unspecified wall of thorax, initial encounter: Secondary | ICD-10-CM | POA: Diagnosis not present

## 2018-08-02 DIAGNOSIS — N2581 Secondary hyperparathyroidism of renal origin: Secondary | ICD-10-CM | POA: Diagnosis not present

## 2018-08-02 DIAGNOSIS — S336XXA Sprain of sacroiliac joint, initial encounter: Secondary | ICD-10-CM | POA: Diagnosis not present

## 2018-08-02 DIAGNOSIS — S39012A Strain of muscle, fascia and tendon of lower back, initial encounter: Secondary | ICD-10-CM | POA: Diagnosis not present

## 2018-08-02 DIAGNOSIS — M9904 Segmental and somatic dysfunction of sacral region: Secondary | ICD-10-CM | POA: Diagnosis not present

## 2018-08-04 DIAGNOSIS — S39012A Strain of muscle, fascia and tendon of lower back, initial encounter: Secondary | ICD-10-CM | POA: Diagnosis not present

## 2018-08-04 DIAGNOSIS — M9904 Segmental and somatic dysfunction of sacral region: Secondary | ICD-10-CM | POA: Diagnosis not present

## 2018-08-04 DIAGNOSIS — M545 Low back pain: Secondary | ICD-10-CM | POA: Diagnosis not present

## 2018-08-04 DIAGNOSIS — M9902 Segmental and somatic dysfunction of thoracic region: Secondary | ICD-10-CM | POA: Diagnosis not present

## 2018-08-04 DIAGNOSIS — S336XXA Sprain of sacroiliac joint, initial encounter: Secondary | ICD-10-CM | POA: Diagnosis not present

## 2018-08-04 DIAGNOSIS — M9903 Segmental and somatic dysfunction of lumbar region: Secondary | ICD-10-CM | POA: Diagnosis not present

## 2018-08-04 DIAGNOSIS — S29019A Strain of muscle and tendon of unspecified wall of thorax, initial encounter: Secondary | ICD-10-CM | POA: Diagnosis not present

## 2018-08-06 DIAGNOSIS — M9902 Segmental and somatic dysfunction of thoracic region: Secondary | ICD-10-CM | POA: Diagnosis not present

## 2018-08-06 DIAGNOSIS — M545 Low back pain: Secondary | ICD-10-CM | POA: Diagnosis not present

## 2018-08-06 DIAGNOSIS — S336XXA Sprain of sacroiliac joint, initial encounter: Secondary | ICD-10-CM | POA: Diagnosis not present

## 2018-08-06 DIAGNOSIS — S39012A Strain of muscle, fascia and tendon of lower back, initial encounter: Secondary | ICD-10-CM | POA: Diagnosis not present

## 2018-08-06 DIAGNOSIS — M9903 Segmental and somatic dysfunction of lumbar region: Secondary | ICD-10-CM | POA: Diagnosis not present

## 2018-08-06 DIAGNOSIS — S29019A Strain of muscle and tendon of unspecified wall of thorax, initial encounter: Secondary | ICD-10-CM | POA: Diagnosis not present

## 2018-08-06 DIAGNOSIS — M9904 Segmental and somatic dysfunction of sacral region: Secondary | ICD-10-CM | POA: Diagnosis not present

## 2018-08-11 DIAGNOSIS — S29019A Strain of muscle and tendon of unspecified wall of thorax, initial encounter: Secondary | ICD-10-CM | POA: Diagnosis not present

## 2018-08-11 DIAGNOSIS — M9903 Segmental and somatic dysfunction of lumbar region: Secondary | ICD-10-CM | POA: Diagnosis not present

## 2018-08-11 DIAGNOSIS — S336XXA Sprain of sacroiliac joint, initial encounter: Secondary | ICD-10-CM | POA: Diagnosis not present

## 2018-08-11 DIAGNOSIS — M9902 Segmental and somatic dysfunction of thoracic region: Secondary | ICD-10-CM | POA: Diagnosis not present

## 2018-08-11 DIAGNOSIS — S39012A Strain of muscle, fascia and tendon of lower back, initial encounter: Secondary | ICD-10-CM | POA: Diagnosis not present

## 2018-08-11 DIAGNOSIS — M9904 Segmental and somatic dysfunction of sacral region: Secondary | ICD-10-CM | POA: Diagnosis not present

## 2018-08-11 DIAGNOSIS — M545 Low back pain: Secondary | ICD-10-CM | POA: Diagnosis not present

## 2018-08-16 DIAGNOSIS — M545 Low back pain: Secondary | ICD-10-CM | POA: Diagnosis not present

## 2018-08-16 DIAGNOSIS — S336XXA Sprain of sacroiliac joint, initial encounter: Secondary | ICD-10-CM | POA: Diagnosis not present

## 2018-08-16 DIAGNOSIS — S29019A Strain of muscle and tendon of unspecified wall of thorax, initial encounter: Secondary | ICD-10-CM | POA: Diagnosis not present

## 2018-08-16 DIAGNOSIS — M9902 Segmental and somatic dysfunction of thoracic region: Secondary | ICD-10-CM | POA: Diagnosis not present

## 2018-08-16 DIAGNOSIS — S39012A Strain of muscle, fascia and tendon of lower back, initial encounter: Secondary | ICD-10-CM | POA: Diagnosis not present

## 2018-08-16 DIAGNOSIS — M9903 Segmental and somatic dysfunction of lumbar region: Secondary | ICD-10-CM | POA: Diagnosis not present

## 2018-08-16 DIAGNOSIS — M9904 Segmental and somatic dysfunction of sacral region: Secondary | ICD-10-CM | POA: Diagnosis not present

## 2018-08-30 DIAGNOSIS — Z1331 Encounter for screening for depression: Secondary | ICD-10-CM | POA: Diagnosis not present

## 2018-08-30 DIAGNOSIS — I10 Essential (primary) hypertension: Secondary | ICD-10-CM | POA: Diagnosis not present

## 2018-08-30 DIAGNOSIS — E1165 Type 2 diabetes mellitus with hyperglycemia: Secondary | ICD-10-CM | POA: Diagnosis not present

## 2018-08-30 DIAGNOSIS — S39012A Strain of muscle, fascia and tendon of lower back, initial encounter: Secondary | ICD-10-CM | POA: Diagnosis not present

## 2018-08-30 DIAGNOSIS — Z0000 Encounter for general adult medical examination without abnormal findings: Secondary | ICD-10-CM | POA: Diagnosis not present

## 2018-08-30 DIAGNOSIS — M545 Low back pain: Secondary | ICD-10-CM | POA: Diagnosis not present

## 2018-08-30 DIAGNOSIS — R5383 Other fatigue: Secondary | ICD-10-CM | POA: Diagnosis not present

## 2018-08-30 DIAGNOSIS — E785 Hyperlipidemia, unspecified: Secondary | ICD-10-CM | POA: Diagnosis not present

## 2018-08-30 DIAGNOSIS — M9902 Segmental and somatic dysfunction of thoracic region: Secondary | ICD-10-CM | POA: Diagnosis not present

## 2018-08-30 DIAGNOSIS — M9904 Segmental and somatic dysfunction of sacral region: Secondary | ICD-10-CM | POA: Diagnosis not present

## 2018-08-30 DIAGNOSIS — S29019A Strain of muscle and tendon of unspecified wall of thorax, initial encounter: Secondary | ICD-10-CM | POA: Diagnosis not present

## 2018-08-30 DIAGNOSIS — Z79899 Other long term (current) drug therapy: Secondary | ICD-10-CM | POA: Diagnosis not present

## 2018-08-30 DIAGNOSIS — S336XXA Sprain of sacroiliac joint, initial encounter: Secondary | ICD-10-CM | POA: Diagnosis not present

## 2018-08-30 DIAGNOSIS — Z6826 Body mass index (BMI) 26.0-26.9, adult: Secondary | ICD-10-CM | POA: Diagnosis not present

## 2018-08-30 DIAGNOSIS — M9903 Segmental and somatic dysfunction of lumbar region: Secondary | ICD-10-CM | POA: Diagnosis not present

## 2018-08-30 DIAGNOSIS — E7439 Other disorders of intestinal carbohydrate absorption: Secondary | ICD-10-CM | POA: Diagnosis not present

## 2019-02-04 DIAGNOSIS — E7439 Other disorders of intestinal carbohydrate absorption: Secondary | ICD-10-CM | POA: Diagnosis not present

## 2019-02-04 DIAGNOSIS — E1165 Type 2 diabetes mellitus with hyperglycemia: Secondary | ICD-10-CM | POA: Diagnosis not present

## 2019-02-04 DIAGNOSIS — D638 Anemia in other chronic diseases classified elsewhere: Secondary | ICD-10-CM | POA: Diagnosis not present

## 2019-02-04 DIAGNOSIS — Z6827 Body mass index (BMI) 27.0-27.9, adult: Secondary | ICD-10-CM | POA: Diagnosis not present

## 2019-02-04 DIAGNOSIS — I10 Essential (primary) hypertension: Secondary | ICD-10-CM | POA: Diagnosis not present

## 2019-02-14 DIAGNOSIS — E113293 Type 2 diabetes mellitus with mild nonproliferative diabetic retinopathy without macular edema, bilateral: Secondary | ICD-10-CM | POA: Diagnosis not present

## 2019-02-16 DIAGNOSIS — L570 Actinic keratosis: Secondary | ICD-10-CM | POA: Diagnosis not present

## 2019-02-16 DIAGNOSIS — L820 Inflamed seborrheic keratosis: Secondary | ICD-10-CM | POA: Diagnosis not present

## 2019-02-16 DIAGNOSIS — L821 Other seborrheic keratosis: Secondary | ICD-10-CM | POA: Diagnosis not present

## 2019-02-16 DIAGNOSIS — Z85828 Personal history of other malignant neoplasm of skin: Secondary | ICD-10-CM | POA: Diagnosis not present

## 2019-06-07 DIAGNOSIS — N183 Chronic kidney disease, stage 3 (moderate): Secondary | ICD-10-CM | POA: Diagnosis not present

## 2019-06-07 DIAGNOSIS — N2581 Secondary hyperparathyroidism of renal origin: Secondary | ICD-10-CM | POA: Diagnosis not present

## 2019-06-07 DIAGNOSIS — E1122 Type 2 diabetes mellitus with diabetic chronic kidney disease: Secondary | ICD-10-CM | POA: Diagnosis not present

## 2019-06-07 DIAGNOSIS — I129 Hypertensive chronic kidney disease with stage 1 through stage 4 chronic kidney disease, or unspecified chronic kidney disease: Secondary | ICD-10-CM | POA: Diagnosis not present

## 2019-06-07 DIAGNOSIS — I5189 Other ill-defined heart diseases: Secondary | ICD-10-CM | POA: Diagnosis not present

## 2019-06-07 DIAGNOSIS — Z23 Encounter for immunization: Secondary | ICD-10-CM | POA: Diagnosis not present

## 2019-06-13 DIAGNOSIS — Z85828 Personal history of other malignant neoplasm of skin: Secondary | ICD-10-CM | POA: Diagnosis not present

## 2019-06-13 DIAGNOSIS — L570 Actinic keratosis: Secondary | ICD-10-CM | POA: Diagnosis not present

## 2019-06-15 DIAGNOSIS — E1165 Type 2 diabetes mellitus with hyperglycemia: Secondary | ICD-10-CM | POA: Diagnosis not present

## 2019-06-15 DIAGNOSIS — E785 Hyperlipidemia, unspecified: Secondary | ICD-10-CM | POA: Diagnosis not present

## 2019-06-15 DIAGNOSIS — I10 Essential (primary) hypertension: Secondary | ICD-10-CM | POA: Diagnosis not present

## 2019-06-24 DIAGNOSIS — E1165 Type 2 diabetes mellitus with hyperglycemia: Secondary | ICD-10-CM | POA: Diagnosis not present

## 2019-07-15 DIAGNOSIS — I10 Essential (primary) hypertension: Secondary | ICD-10-CM | POA: Diagnosis not present

## 2019-07-15 DIAGNOSIS — D638 Anemia in other chronic diseases classified elsewhere: Secondary | ICD-10-CM | POA: Diagnosis not present

## 2019-07-15 DIAGNOSIS — N189 Chronic kidney disease, unspecified: Secondary | ICD-10-CM | POA: Diagnosis not present

## 2019-09-27 DIAGNOSIS — E7800 Pure hypercholesterolemia, unspecified: Secondary | ICD-10-CM | POA: Diagnosis not present

## 2019-09-27 DIAGNOSIS — D638 Anemia in other chronic diseases classified elsewhere: Secondary | ICD-10-CM | POA: Diagnosis not present

## 2019-09-27 DIAGNOSIS — M810 Age-related osteoporosis without current pathological fracture: Secondary | ICD-10-CM | POA: Diagnosis not present

## 2019-09-27 DIAGNOSIS — E1165 Type 2 diabetes mellitus with hyperglycemia: Secondary | ICD-10-CM | POA: Diagnosis not present

## 2019-09-27 DIAGNOSIS — Z79899 Other long term (current) drug therapy: Secondary | ICD-10-CM | POA: Diagnosis not present

## 2019-10-11 DIAGNOSIS — I499 Cardiac arrhythmia, unspecified: Secondary | ICD-10-CM | POA: Diagnosis not present

## 2019-10-11 DIAGNOSIS — Z1331 Encounter for screening for depression: Secondary | ICD-10-CM | POA: Diagnosis not present

## 2019-10-11 DIAGNOSIS — Z6828 Body mass index (BMI) 28.0-28.9, adult: Secondary | ICD-10-CM | POA: Diagnosis not present

## 2019-10-11 DIAGNOSIS — Z9181 History of falling: Secondary | ICD-10-CM | POA: Diagnosis not present

## 2019-10-11 DIAGNOSIS — N189 Chronic kidney disease, unspecified: Secondary | ICD-10-CM | POA: Diagnosis not present

## 2019-10-11 DIAGNOSIS — Z0000 Encounter for general adult medical examination without abnormal findings: Secondary | ICD-10-CM | POA: Diagnosis not present

## 2019-10-15 DIAGNOSIS — N189 Chronic kidney disease, unspecified: Secondary | ICD-10-CM | POA: Diagnosis not present

## 2019-10-15 DIAGNOSIS — E7800 Pure hypercholesterolemia, unspecified: Secondary | ICD-10-CM | POA: Diagnosis not present

## 2019-10-15 DIAGNOSIS — I10 Essential (primary) hypertension: Secondary | ICD-10-CM | POA: Diagnosis not present

## 2019-10-17 DIAGNOSIS — E119 Type 2 diabetes mellitus without complications: Secondary | ICD-10-CM | POA: Diagnosis not present

## 2019-11-02 ENCOUNTER — Telehealth: Payer: Self-pay | Admitting: Cardiology

## 2019-11-02 MED ORDER — HYDRALAZINE HCL 50 MG PO TABS: 50.0000 mg | ORAL_TABLET | Freq: Two times a day (BID) | ORAL | 0 refills | Status: DC

## 2019-11-02 NOTE — Telephone Encounter (Signed)
New message   Pt c/o BP issue: STAT if pt c/o blurred vision, one-sided weakness or slurred speech  1. What are your last 5 BP readings? 180/77  172/80   160/79   163/82  174/82  2. Are you having any other symptoms (ex. Dizziness, headache, blurred vision, passed out)?no   3. What is your BP issue? Patient states that his b/p is elevated.

## 2019-11-02 NOTE — Telephone Encounter (Signed)
Returned call to patient of Dr. Bettina Gavia, last seen 04/2018 He reports BP has been elevated for 3+ weeks - 180/77, 172/80, 160/79, 163/82, 174/82 He reports his PCP has been working with him on his BP but unable to get it down - increased hydralazine to 50mg  BID - still on coreg 6.25mg  BID  Scheduled him to see MD on 2/23

## 2019-11-08 ENCOUNTER — Ambulatory Visit: Payer: Medicare Other | Admitting: Cardiology

## 2019-11-13 DIAGNOSIS — E1165 Type 2 diabetes mellitus with hyperglycemia: Secondary | ICD-10-CM | POA: Diagnosis not present

## 2019-11-13 DIAGNOSIS — E7800 Pure hypercholesterolemia, unspecified: Secondary | ICD-10-CM | POA: Diagnosis not present

## 2019-11-30 ENCOUNTER — Ambulatory Visit: Payer: Medicare Other | Admitting: Cardiology

## 2019-12-07 DIAGNOSIS — L570 Actinic keratosis: Secondary | ICD-10-CM | POA: Diagnosis not present

## 2019-12-07 DIAGNOSIS — L821 Other seborrheic keratosis: Secondary | ICD-10-CM | POA: Diagnosis not present

## 2019-12-07 DIAGNOSIS — Z85828 Personal history of other malignant neoplasm of skin: Secondary | ICD-10-CM | POA: Diagnosis not present

## 2019-12-07 DIAGNOSIS — L565 Disseminated superficial actinic porokeratosis (DSAP): Secondary | ICD-10-CM | POA: Diagnosis not present

## 2019-12-07 NOTE — Progress Notes (Signed)
Cardiology Office Note:    Date:  12/08/2019   ID:  Jason Beltran, DOB Jun 14, 1941, MRN [id number]  PCP:  Jason Sheriff, MD  Cardiologist:  Jason More, MD    Referring MD: Jason Sheriff, MD    ASSESSMENT:    1. Hypertension, renal disease, stage 1-4 or unspecified chronic kidney disease   2. Stage 4 chronic kidney disease ([place])   3. Nonrheumatic aortic valve stenosis   4. Acute gout of right foot, unspecified cause    PLAN:    In order of problems listed above:  1. Hypertension, I am unsure how well he is controlled I asked him to follow routine checking blood pressure twice a day after and before medications sitting and resting 5 minutes once a day sitting and standing to establish a baseline and for now continue his current treatment.  I think some of the erratic readings were due to poor technique. 2. Stable on recent labs 3. Recheck echocardiogram clinically I do not think he has had significant progression and he needs reassurance 4. Ongoing gout I think he can take an infrequent tablet of colchicine after doing a quick literature search not Beltran than every 2 weeks   Next appointment: 3 to 4 weeks   Medication Adjustments/Labs and Tests Ordered: Current medicines are reviewed at length with the patient today.  Concerns regarding medicines are outlined above.  Orders Placed This Encounter  Procedures  . EKG 12-Lead  . ECHOCARDIOGRAM COMPLETE   Meds ordered this encounter  Medications  . colchicine 0.6 MG tablet    Sig: Take one tablet by mouth today 3/25. Do not take tomorrow 3/26. Then do not take Beltran than once a week    Dispense:  10 tablet    Refill:  0    Chief Complaint  Patient presents with  . Hypertension    History of Present Illness:    Jason Beltran is a 79 y.o. male with a hx of mild aortic stenosis,Type 2 DM and hypertension  last seen 05/11/2019.  Last GFR was 29 cc August 2019.  When last seen he had fluid overload associated with  hypertension and Actos therapy for diabetes. Compliance with diet, lifestyle and medications: Yes  Echocardiogram performed 05/06/2018 showed a trileaflet aortic valve there is no regurgitation peak gradient 21 mmHg mean gradient of 9 valve area 2.3 cm very mild aortic stenosis.  Jason Beltran has several concerns.  His first is gout in the right foot I did a quick search and he can take a minimum dose of colchicine and I gave him a prescription not to take Beltran than a tablet every 2 weeks.  His second concern is aortic valve I told him he had very mild stenosis typically does not progress quickly his physical exam is reassuring and we will recheck an echocardiogram.  His third concern is his blood pressure he checks his blood pressure standing and checks it primarily taking his doses of hydralazine he is getting variable numbers at times as high as 170.  We discussed with him tips to accurately check blood pressure and have asked him to take it late morning and early afternoon to avoid peaks and valleys to rest 5 minutes and check them once a day sitting and standing and when he comes back to the office bring a list of medications for now he will continue his diuretic low-dose beta-blocker and hydralazine.  He is on lipid-lowering therapy with simvastatin tolerates it without muscle pain  or weakness.  His most recent lipid profile 09/27/2019 is a cholesterol 131 HDL 39 LDL 71 creatinine 1.9 he should track to a GFR approximately 30 cc. Past Medical History:  Diagnosis Date  . Anemia of chronic disease   . Chronic sinusitis   . CKD (chronic kidney disease)   . Diabetes mellitus type 2, uncontrolled ([place])   . Diastolic CHF ([place])   . Hyperparathyroidism ([place])   . Hypertension   . Neuropathy, peripheral   . Obesity     Past Surgical History:  Procedure Laterality Date  . ANKLE SURGERY    . NASAL SINUS SURGERY    . VASECTOMY      Current Medications: Current Meds  Medication Sig  . aspirin 81 MG EC  tablet Take 81 mg by mouth daily. Swallow whole.  Marland Kitchen BIOTIN 5000 PO Take 1 tablet by mouth daily.  . carvedilol (COREG) 6.25 MG tablet Take 6.25 mg by mouth 2 (two) times daily with a meal.  . chlorthalidone (HYGROTON) 25 MG tablet Take 25 mg by mouth daily.  . Coenzyme Q10 (COQ10) 100 MG CAPS Take 1 capsule by mouth daily.  Marland Kitchen gabapentin (NEURONTIN) 400 MG capsule Take 400 mg by mouth 2 (two) times daily.   . Glucosamine-Chondroit-Vit C-Mn (GLUCOSAMINE 1500 COMPLEX) CAPS Take 1 capsule by mouth daily.  . hydrALAZINE (APRESOLINE) 50 MG tablet Take 1 tablet (50 mg total) by mouth 2 (two) times daily.  . Multiple Vitamin (MULTIVITAMIN) tablet Take 1 tablet by mouth daily.  . Omega-3 Fatty Acids (FISH OIL) 1200 MG CAPS Take 1,200 mg by mouth 2 (two) times daily.  . Saw Palmetto 450 MG CAPS Take 3 capsules by mouth daily.  . simvastatin (ZOCOR) 20 MG tablet Take 20 mg by mouth daily.  . [DISCONTINUED] Insulin Glargine (LANTUS) 100 UNIT/ML Solostar Pen Inject into the skin daily.  . [DISCONTINUED] linagliptin (TRADJENTA) 5 MG TABS tablet Take 5 mg by mouth daily.  . [DISCONTINUED] vitamin C (ASCORBIC ACID) 500 MG tablet Take 500 mg by mouth daily.     Allergies:   Motrin [ibuprofen]   Social History   Socioeconomic History  . Marital status: Married    Spouse name: Not on file  . Number of children: Not on file  . Years of education: Not on file  . Highest education level: Not on file  Occupational History  . Not on file  Tobacco Use  . Smoking status: Never Smoker  . Smokeless tobacco: Never Used  Substance and Sexual Activity  . Alcohol use: Yes    Comment: occasional glass of wine  . Drug use: Never  . Sexual activity: Not on file  Other Topics Concern  . Not on file  Social History Narrative  . Not on file   Social Determinants of Health   Financial Resource Strain:   . Difficulty of Paying Living Expenses:   Food Insecurity:   . Worried About Charity fundraiser in the  Last Year:   . Arboriculturist in the Last Year:   Transportation Needs:   . Film/video editor (Medical):   Marland Kitchen Lack of Transportation (Non-Medical):   Physical Activity:   . Days of Exercise per Week:   . Minutes of Exercise per Session:   Stress:   . Feeling of Stress :   Social Connections:   . Frequency of Communication with Friends and Family:   . Frequency of Social Gatherings with Friends and Family:   . Attends  Religious Services:   . Active Member of Clubs or Organizations:   . Attends Archivist Meetings:   Marland Kitchen Marital Status:      Family History: The patient's family history includes Heart failure in his father; Stroke in his paternal aunt and paternal grandmother. ROS:   Please see the history of present illness.    All other systems reviewed and are negative.  EKGs/Labs/Other Studies Reviewed:    The following studies were reviewed today:  EKG:  EKG ordered today and personally reviewed.  The ekg ordered today demonstrates sinus rhythm 1 APC otherwise normal    Physical Exam:    VS:  BP 128/70   Pulse (!) 55   Ht 5\' 8"  (1.727 m)   Wt 190 lb (86.2 kg)   SpO2 98%   BMI 28.89 kg/m     Wt Readings from Last 3 Encounters:  12/08/19 190 lb (86.2 kg)  05/10/18 179 lb (81.2 kg)  05/06/18 178 lb 6.4 oz (80.9 kg)     GEN: He is aged well nourished, well developed in no acute distress HEENT: Normal NECK: No JVD; No carotid bruits LYMPHATICS: No lymphadenopathy CARDIAC: Grade 1/6 to 2/6 midsystolic ejection murmur aortic area only does not encompass S2 no aortic regurgitation.  RRR, no rubs, gallops RESPIRATORY:  Clear to auscultation without rales, wheezing or rhonchi  ABDOMEN: Soft, non-tender, non-distended MUSCULOSKELETAL:  No edema; No deformity  SKIN: Warm and dry NEUROLOGIC:  Alert and oriented x 3 PSYCHIATRIC:  Normal affect    Signed, Jason More, MD  12/08/2019 11:08 AM    [place]

## 2019-12-08 ENCOUNTER — Other Ambulatory Visit: Payer: Self-pay

## 2019-12-08 ENCOUNTER — Ambulatory Visit (INDEPENDENT_AMBULATORY_CARE_PROVIDER_SITE_OTHER): Payer: Medicare Other | Admitting: Cardiology

## 2019-12-08 ENCOUNTER — Encounter: Payer: Self-pay | Admitting: Cardiology

## 2019-12-08 ENCOUNTER — Ambulatory Visit: Payer: Medicare Other | Admitting: Cardiology

## 2019-12-08 VITALS — BP 128/70 | HR 55 | Ht 68.0 in | Wt 190.0 lb

## 2019-12-08 DIAGNOSIS — I129 Hypertensive chronic kidney disease with stage 1 through stage 4 chronic kidney disease, or unspecified chronic kidney disease: Secondary | ICD-10-CM | POA: Diagnosis not present

## 2019-12-08 DIAGNOSIS — N184 Chronic kidney disease, stage 4 (severe): Secondary | ICD-10-CM | POA: Diagnosis not present

## 2019-12-08 DIAGNOSIS — I350 Nonrheumatic aortic (valve) stenosis: Secondary | ICD-10-CM

## 2019-12-08 DIAGNOSIS — M109 Gout, unspecified: Secondary | ICD-10-CM

## 2019-12-08 MED ORDER — COLCHICINE 0.6 MG PO TABS: ORAL_TABLET | ORAL | 0 refills | Status: DC

## 2019-12-08 NOTE — Patient Instructions (Addendum)
Medication Instructions:  Your physician has recommended you make the following change in your medication:  START Colchicine 0.6 mg take one tablet by mouth today 3/25. Do not take tomorrow 3/26. Then do not take more than once a week as needed. *If you need a refill on your cardiac medications before your next appointment, please call your pharmacy*   Lab Work: None If you have labs (blood work) drawn today and your tests are completely normal, you will receive your results only by: Marland Kitchen MyChart Message (if you have MyChart) OR . A paper copy in the mail If you have any lab test that is abnormal or we need to change your treatment, we will call you to review the results.   Testing/Procedures: Your physician has requested that you have an echocardiogram. Echocardiography is a painless test that uses sound waves to create images of your heart. It provides your doctor with information about the size and shape of your heart and how well your heart's chambers and valves are working. This procedure takes approximately one hour. There are no restrictions for this procedure.     Follow-Up: At [facility], you and your health needs are our priority.  As part of our continuing mission to provide you with exceptional heart care, we have created designated Provider Care Teams.  These Care Teams include your primary Cardiologist (physician) and Advanced Practice Providers (APPs -  Physician Assistants and Nurse Practitioners) who all work together to provide you with the care you need, when you need it.  We recommend signing up for the patient portal called "MyChart".  Sign up information is provided on this After Visit Summary.  MyChart is used to connect with patients for Virtual Visits (Telemedicine).  Patients are able to view lab/test results, encounter notes, upcoming appointments, etc.  Non-urgent messages can be sent to your provider as well.   To learn more about what you can do with MyChart, go  to [email or web address].    Your next appointment:   1 month  The format for your next appointment:   In Person  Provider:   Shirlee More, MD   Other Instructions [facility]  Tips to measure your blood pressure correctly  To determine whether you have hypertension, a medical professional will take a blood pressure reading. How you prepare for the test, the position of your arm, and other factors can change a blood pressure reading by 10% or more. That could be enough to hide high blood pressure, start you on a drug you don't really need, or lead your doctor to incorrectly adjust your medications. National and international guidelines offer specific instructions for measuring blood pressure. If a doctor, nurse, or medical assistant isn't doing it right, don't hesitate to ask him or her to get with the guidelines. Here's what you can do to ensure a correct reading: . Don't drink a caffeinated beverage or smoke during the 30 minutes before the test. . Sit quietly for five minutes before the test begins. . During the measurement, sit in a chair with your feet on the floor and your arm supported so your elbow is at about heart level. . The inflatable part of the cuff should completely cover at least 80% of your upper arm, and the cuff should be placed on bare skin, not over a shirt. . Don't talk during the measurement. . Have your blood pressure measured twice, with a brief break in between. If the readings are different by 5 points or more, have  it done a third time. There are times to break these rules. If you sometimes feel lightheaded when getting out of bed in the morning or when you stand after sitting, you should have your blood pressure checked while seated and then while standing to see if it falls from one position to the next. Because blood pressure varies throughout the day, your doctor will rarely diagnose hypertension on the basis of a single reading. Instead, he or she will  want to confirm the measurements on at least two occasions, usually within a few weeks of one another. The exception to this rule is if you have a blood pressure reading of 180/110 mm Hg or higher. A result this high usually calls for prompt treatment. It's also a good idea to have your blood pressure measured in both arms at least once, since the reading in one arm (usually the right) may be higher than that in the left. A 2014 study in The American Journal of Medicine of nearly 3,400 people found average arm- to-arm differences in systolic blood pressure of about 5 points. The higher number should be used to make treatment decisions. In 2017, new guidelines from the [place], the [employer] of Cardiology, and nine other health organizations lowered the diagnosis of high blood pressure to 130/80 mm Hg or higher for all adults. The guidelines also redefined the various blood pressure categories to now include normal, elevated, Stage 1 hypertension, Stage 2 hypertension, and hypertensive crisis (see "Blood pressure categories"). Blood pressure categories  Blood pressure category SYSTOLIC (upper number)  DIASTOLIC (lower number)  Normal Less than 120 mm Hg and Less than 80 mm Hg  Elevated 120-129 mm Hg and Less than 80 mm Hg  High blood pressure: Stage 1 hypertension 130-139 mm Hg or 80-89 mm Hg  High blood pressure: Stage 2 hypertension 140 mm Hg or higher or 90 mm Hg or higher  Hypertensive crisis (consult your doctor immediately) Higher than 180 mm Hg and/or Higher than 120 mm Hg  Source: American Heart Association and American Stroke Association. For more on getting your blood pressure under control, buy Controlling Your Blood Pressure, a Special Health Report from [facility].

## 2019-12-14 DIAGNOSIS — I10 Essential (primary) hypertension: Secondary | ICD-10-CM | POA: Diagnosis not present

## 2019-12-14 DIAGNOSIS — E7800 Pure hypercholesterolemia, unspecified: Secondary | ICD-10-CM | POA: Diagnosis not present

## 2020-01-02 ENCOUNTER — Ambulatory Visit: Payer: Medicare Other | Admitting: Cardiology

## 2020-01-09 DIAGNOSIS — Z79899 Other long term (current) drug therapy: Secondary | ICD-10-CM | POA: Diagnosis not present

## 2020-01-09 DIAGNOSIS — E1165 Type 2 diabetes mellitus with hyperglycemia: Secondary | ICD-10-CM | POA: Diagnosis not present

## 2020-01-13 DIAGNOSIS — I10 Essential (primary) hypertension: Secondary | ICD-10-CM | POA: Diagnosis not present

## 2020-01-13 DIAGNOSIS — E1165 Type 2 diabetes mellitus with hyperglycemia: Secondary | ICD-10-CM | POA: Diagnosis not present

## 2020-01-16 ENCOUNTER — Other Ambulatory Visit: Payer: Self-pay

## 2020-01-16 ENCOUNTER — Ambulatory Visit (INDEPENDENT_AMBULATORY_CARE_PROVIDER_SITE_OTHER): Payer: Medicare Other

## 2020-01-16 ENCOUNTER — Ambulatory Visit (INDEPENDENT_AMBULATORY_CARE_PROVIDER_SITE_OTHER): Payer: Medicare Other | Admitting: Cardiology

## 2020-01-16 ENCOUNTER — Encounter: Payer: Self-pay | Admitting: Cardiology

## 2020-01-16 VITALS — BP 132/70 | HR 56 | Temp 97.0°F | Ht 68.0 in | Wt 190.0 lb

## 2020-01-16 DIAGNOSIS — N184 Chronic kidney disease, stage 4 (severe): Secondary | ICD-10-CM

## 2020-01-16 DIAGNOSIS — I350 Nonrheumatic aortic (valve) stenosis: Secondary | ICD-10-CM

## 2020-01-16 DIAGNOSIS — I129 Hypertensive chronic kidney disease with stage 1 through stage 4 chronic kidney disease, or unspecified chronic kidney disease: Secondary | ICD-10-CM

## 2020-01-16 DIAGNOSIS — E782 Mixed hyperlipidemia: Secondary | ICD-10-CM | POA: Diagnosis not present

## 2020-01-16 NOTE — Progress Notes (Signed)
Cardiology Office Note:    Date:  01/16/2020   ID:  Jason Beltran, DOB 1940-12-15, MRN [id number]  PCP:  Angelina Sheriff, MD  Cardiologist:  Shirlee More, MD    Referring MD: Angelina Sheriff, MD    ASSESSMENT:    1. Hypertension, renal disease, stage 1-4 or unspecified chronic kidney disease   2. Stage 4 chronic kidney disease (Jason Beltran)   3. Nonrheumatic aortic valve stenosis   4. Mixed hyperlipidemia    PLAN:    In order of problems listed above:  1. Blood pressure at target we decided to recheck his blood pressure Miller day after medication once daily I continue his current medical regimen. 2. Followed by nephrology primary care physician 3. Stable no progression on echocardiogram 4. Continue current treatment statin fish oil lipids at target   Next appointment: 6 months   Medication Adjustments/Labs and Tests Ordered: Current medicines are reviewed at length with the patient today.  Concerns regarding medicines are outlined above.  No orders of the defined types were placed in this encounter.  No orders of the defined types were placed in this encounter.   Chief Complaint  Patient presents with  . Follow-up  . Hypertension    History of Present Illness:    Jason Beltran is a 79 y.o. male with a hx of  mild aortic stenosis,Type 2 DM and hypertension and edema with Actos.  Echocardiogram performed 05/06/2018 showed a trileaflet aortic valve there is no regurgitation peak gradient 21 mmHg mean gradient of 9 valve area 2.3 cm very mild aortic stenosis.    He was last seen 12/08/2019.  Compliance with diet, lifestyle and medications: Yes  He is checking his blood pressure at different times however in the middle of the day after medication runs 130 140/70-80.  I again discussed with him the importance of good technique.  Chest pain shortness of breath palpitation or syncope.  I took a quick look at his echocardiogram and aortic stenosis is not progressed and he  has no LV dysfunction.  He tells me his renal functions worsened creatinine of 2.0.  No edema chest pain orthopnea. Past Medical History:  Diagnosis Date  . Anemia of chronic disease   . Chronic sinusitis   . CKD (chronic kidney disease)   . Diabetes mellitus type 2, uncontrolled ([place])   . Diastolic CHF ([place])   . Hyperparathyroidism ([place])   . Hypertension   . Neuropathy, peripheral   . Obesity     Past Surgical History:  Procedure Laterality Date  . ANKLE SURGERY    . NASAL SINUS SURGERY    . VASECTOMY      Current Medications: Current Meds  Medication Sig  . aspirin 81 MG EC tablet Take 81 mg by mouth daily. Swallow whole.  Marland Kitchen BIOTIN 5000 PO Take 1 tablet by mouth daily.  . carvedilol (COREG) 6.25 MG tablet Take 6.25 mg by mouth 2 (two) times daily with a meal.  . Coenzyme Q10 (COQ10) 100 MG CAPS Take 1 capsule by mouth daily.  Marland Kitchen gabapentin (NEURONTIN) 400 MG capsule Take 400 mg by mouth 2 (two) times daily.   . Glucosamine-Chondroit-Vit C-Mn (GLUCOSAMINE 1500 COMPLEX) CAPS Take 1 capsule by mouth daily.  . hydrALAZINE (APRESOLINE) 25 MG tablet Take 25 mg by mouth daily.  . Multiple Vitamin (MULTIVITAMIN) tablet Take 1 tablet by mouth daily.  . Omega-3 Fatty Acids (FISH OIL) 1200 MG CAPS Take 1,200 mg by mouth 2 (two) times daily.  Marland Kitchen  Saw Palmetto 450 MG CAPS Take 3 capsules by mouth daily.  . simvastatin (ZOCOR) 20 MG tablet Take 20 mg by mouth daily.     Allergies:   Motrin [ibuprofen]   Social History   Socioeconomic History  . Marital status: Married    Spouse name: Not on file  . Number of children: Not on file  . Years of education: Not on file  . Highest education level: Not on file  Occupational History  . Not on file  Tobacco Use  . Smoking status: Never Smoker  . Smokeless tobacco: Never Used  Substance and Sexual Activity  . Alcohol use: Yes    Comment: occasional glass of wine  . Drug use: Never  . Sexual activity: Not on file  Other Topics Concern    . Not on file  Social History Narrative  . Not on file   Social Determinants of Health   Financial Resource Strain:   . Difficulty of Paying Living Expenses:   Food Insecurity:   . Worried About Charity fundraiser in the Last Year:   . Arboriculturist in the Last Year:   Transportation Needs:   . Film/video editor (Medical):   Marland Kitchen Lack of Transportation (Non-Medical):   Physical Activity:   . Days of Exercise per Week:   . Minutes of Exercise per Session:   Stress:   . Feeling of Stress :   Social Connections:   . Frequency of Communication with Friends and Family:   . Frequency of Social Gatherings with Friends and Family:   . Attends Religious Services:   . Active Member of Clubs or Organizations:   . Attends Archivist Meetings:   Marland Kitchen Marital Status:      Family History: The patient's family history includes Heart failure in his father; Stroke in his paternal aunt and paternal grandmother. ROS:   Please see the history of present illness.    All other systems reviewed and are negative.  EKGs/Labs/Other Studies Reviewed:    The following studies were reviewed today:    Recent Labs: 09/27/2019: Cholesterol 131 HDL 39 LDL 71 A1c normal 5.4% Creatinine 01/09/2020 2.0  Physical Exam:    VS:  BP 132/70   Pulse (!) 56   Temp (!) 97 F (36.1 C)   Ht 5\' 8"  (1.727 m)   Wt 190 lb (86.2 kg)   SpO2 98%   BMI 28.89 kg/m     Wt Readings from Last 3 Encounters:  01/16/20 190 lb (86.2 kg)  12/08/19 190 lb (86.2 kg)  05/10/18 179 lb (81.2 kg)     GEN:  Well nourished, well developed in no acute distress HEENT: Normal NECK: No JVD; No carotid bruits LYMPHATICS: No lymphadenopathy CARDIAC: RRR, no murmurs, rubs, gallops RESPIRATORY:  Clear to auscultation without rales, wheezing or rhonchi  ABDOMEN: Soft, non-tender, non-distended MUSCULOSKELETAL:  No edema; No deformity  SKIN: Warm and dry NEUROLOGIC:  Alert and oriented x 3 PSYCHIATRIC:  Normal  affect    Signed, Shirlee More, MD  01/16/2020 1:38 PM    [facility]

## 2020-01-16 NOTE — Patient Instructions (Addendum)
Medication Instructions:  Your physician recommends that you continue on your current medications as directed. Please refer to the Current Medication list given to you today.  *If you need a refill on your cardiac medications before your next appointment, please call your pharmacy*   Lab Work: None If you have labs (blood work) drawn today and your tests are completely normal, you will receive your results only by: Marland Kitchen MyChart Message (if you have MyChart) OR . A paper copy in the mail If you have any lab test that is abnormal or we need to change your treatment, we will call you to review the results.   Testing/Procedures: None   Follow-Up: At [facility], you and your health needs are our priority.  As part of our continuing mission to provide you with exceptional heart care, we have created designated Provider Care Teams.  These Care Teams include your primary Cardiologist (physician) and Advanced Practice Providers (APPs -  Physician Assistants and Nurse Practitioners) who all work together to provide you with the care you need, when you need it.  We recommend signing up for the patient portal called "MyChart".  Sign up information is provided on this After Visit Summary.  MyChart is used to connect with patients for Virtual Visits (Telemedicine).  Patients are able to view lab/test results, encounter notes, upcoming appointments, etc.  Non-urgent messages can be sent to your provider as well.   To learn more about what you can do with MyChart, go to [email or web address].    Your next appointment:   6 month(s)  The format for your next appointment:   In Person  Provider:   Shirlee More, MD   Other Instructions Please take your BP once a day in the mid-afternoon.

## 2020-01-16 NOTE — Progress Notes (Unsigned)
Complete echocardiogram has been performed.  Jimmy Bronsyn Shappell RDCS, RVT 

## 2020-01-30 DIAGNOSIS — N189 Chronic kidney disease, unspecified: Secondary | ICD-10-CM | POA: Diagnosis not present

## 2020-01-30 DIAGNOSIS — Z6828 Body mass index (BMI) 28.0-28.9, adult: Secondary | ICD-10-CM | POA: Diagnosis not present

## 2020-01-30 DIAGNOSIS — M7022 Olecranon bursitis, left elbow: Secondary | ICD-10-CM | POA: Diagnosis not present

## 2020-02-16 DIAGNOSIS — M9903 Segmental and somatic dysfunction of lumbar region: Secondary | ICD-10-CM | POA: Diagnosis not present

## 2020-02-16 DIAGNOSIS — M9904 Segmental and somatic dysfunction of sacral region: Secondary | ICD-10-CM | POA: Diagnosis not present

## 2020-02-16 DIAGNOSIS — M9905 Segmental and somatic dysfunction of pelvic region: Secondary | ICD-10-CM | POA: Diagnosis not present

## 2020-02-16 DIAGNOSIS — M461 Sacroiliitis, not elsewhere classified: Secondary | ICD-10-CM | POA: Diagnosis not present

## 2020-02-16 DIAGNOSIS — M545 Low back pain: Secondary | ICD-10-CM | POA: Diagnosis not present

## 2020-02-16 DIAGNOSIS — M9902 Segmental and somatic dysfunction of thoracic region: Secondary | ICD-10-CM | POA: Diagnosis not present

## 2020-02-20 DIAGNOSIS — M9902 Segmental and somatic dysfunction of thoracic region: Secondary | ICD-10-CM | POA: Diagnosis not present

## 2020-02-20 DIAGNOSIS — M9905 Segmental and somatic dysfunction of pelvic region: Secondary | ICD-10-CM | POA: Diagnosis not present

## 2020-02-20 DIAGNOSIS — M545 Low back pain: Secondary | ICD-10-CM | POA: Diagnosis not present

## 2020-02-20 DIAGNOSIS — M461 Sacroiliitis, not elsewhere classified: Secondary | ICD-10-CM | POA: Diagnosis not present

## 2020-02-20 DIAGNOSIS — M9903 Segmental and somatic dysfunction of lumbar region: Secondary | ICD-10-CM | POA: Diagnosis not present

## 2020-02-20 DIAGNOSIS — M9904 Segmental and somatic dysfunction of sacral region: Secondary | ICD-10-CM | POA: Diagnosis not present

## 2020-02-22 DIAGNOSIS — M545 Low back pain: Secondary | ICD-10-CM | POA: Diagnosis not present

## 2020-02-22 DIAGNOSIS — M461 Sacroiliitis, not elsewhere classified: Secondary | ICD-10-CM | POA: Diagnosis not present

## 2020-02-22 DIAGNOSIS — M9904 Segmental and somatic dysfunction of sacral region: Secondary | ICD-10-CM | POA: Diagnosis not present

## 2020-02-22 DIAGNOSIS — M9903 Segmental and somatic dysfunction of lumbar region: Secondary | ICD-10-CM | POA: Diagnosis not present

## 2020-02-22 DIAGNOSIS — M9902 Segmental and somatic dysfunction of thoracic region: Secondary | ICD-10-CM | POA: Diagnosis not present

## 2020-02-22 DIAGNOSIS — M9905 Segmental and somatic dysfunction of pelvic region: Secondary | ICD-10-CM | POA: Diagnosis not present

## 2020-02-27 DIAGNOSIS — M9904 Segmental and somatic dysfunction of sacral region: Secondary | ICD-10-CM | POA: Diagnosis not present

## 2020-02-27 DIAGNOSIS — M9902 Segmental and somatic dysfunction of thoracic region: Secondary | ICD-10-CM | POA: Diagnosis not present

## 2020-02-27 DIAGNOSIS — M9903 Segmental and somatic dysfunction of lumbar region: Secondary | ICD-10-CM | POA: Diagnosis not present

## 2020-02-27 DIAGNOSIS — M461 Sacroiliitis, not elsewhere classified: Secondary | ICD-10-CM | POA: Diagnosis not present

## 2020-02-27 DIAGNOSIS — M545 Low back pain: Secondary | ICD-10-CM | POA: Diagnosis not present

## 2020-02-27 DIAGNOSIS — M7022 Olecranon bursitis, left elbow: Secondary | ICD-10-CM | POA: Diagnosis not present

## 2020-02-27 DIAGNOSIS — M9905 Segmental and somatic dysfunction of pelvic region: Secondary | ICD-10-CM | POA: Diagnosis not present

## 2020-02-29 DIAGNOSIS — M9904 Segmental and somatic dysfunction of sacral region: Secondary | ICD-10-CM | POA: Diagnosis not present

## 2020-02-29 DIAGNOSIS — M9903 Segmental and somatic dysfunction of lumbar region: Secondary | ICD-10-CM | POA: Diagnosis not present

## 2020-02-29 DIAGNOSIS — M9905 Segmental and somatic dysfunction of pelvic region: Secondary | ICD-10-CM | POA: Diagnosis not present

## 2020-02-29 DIAGNOSIS — M9902 Segmental and somatic dysfunction of thoracic region: Secondary | ICD-10-CM | POA: Diagnosis not present

## 2020-02-29 DIAGNOSIS — M545 Low back pain: Secondary | ICD-10-CM | POA: Diagnosis not present

## 2020-02-29 DIAGNOSIS — M461 Sacroiliitis, not elsewhere classified: Secondary | ICD-10-CM | POA: Diagnosis not present

## 2020-04-06 DIAGNOSIS — M9905 Segmental and somatic dysfunction of pelvic region: Secondary | ICD-10-CM | POA: Diagnosis not present

## 2020-04-06 DIAGNOSIS — M9902 Segmental and somatic dysfunction of thoracic region: Secondary | ICD-10-CM | POA: Diagnosis not present

## 2020-04-06 DIAGNOSIS — M461 Sacroiliitis, not elsewhere classified: Secondary | ICD-10-CM | POA: Diagnosis not present

## 2020-04-06 DIAGNOSIS — M9904 Segmental and somatic dysfunction of sacral region: Secondary | ICD-10-CM | POA: Diagnosis not present

## 2020-04-06 DIAGNOSIS — M9903 Segmental and somatic dysfunction of lumbar region: Secondary | ICD-10-CM | POA: Diagnosis not present

## 2020-04-06 DIAGNOSIS — M545 Low back pain: Secondary | ICD-10-CM | POA: Diagnosis not present

## 2020-04-11 DIAGNOSIS — M9902 Segmental and somatic dysfunction of thoracic region: Secondary | ICD-10-CM | POA: Diagnosis not present

## 2020-04-11 DIAGNOSIS — M9903 Segmental and somatic dysfunction of lumbar region: Secondary | ICD-10-CM | POA: Diagnosis not present

## 2020-04-11 DIAGNOSIS — M9904 Segmental and somatic dysfunction of sacral region: Secondary | ICD-10-CM | POA: Diagnosis not present

## 2020-04-11 DIAGNOSIS — M545 Low back pain: Secondary | ICD-10-CM | POA: Diagnosis not present

## 2020-04-11 DIAGNOSIS — M9905 Segmental and somatic dysfunction of pelvic region: Secondary | ICD-10-CM | POA: Diagnosis not present

## 2020-04-11 DIAGNOSIS — M461 Sacroiliitis, not elsewhere classified: Secondary | ICD-10-CM | POA: Diagnosis not present

## 2020-04-14 DIAGNOSIS — E1122 Type 2 diabetes mellitus with diabetic chronic kidney disease: Secondary | ICD-10-CM | POA: Diagnosis not present

## 2020-04-14 DIAGNOSIS — I129 Hypertensive chronic kidney disease with stage 1 through stage 4 chronic kidney disease, or unspecified chronic kidney disease: Secondary | ICD-10-CM | POA: Diagnosis not present

## 2020-04-14 DIAGNOSIS — E7800 Pure hypercholesterolemia, unspecified: Secondary | ICD-10-CM | POA: Diagnosis not present

## 2020-04-14 DIAGNOSIS — N189 Chronic kidney disease, unspecified: Secondary | ICD-10-CM | POA: Diagnosis not present

## 2020-04-18 DIAGNOSIS — M545 Low back pain: Secondary | ICD-10-CM | POA: Diagnosis not present

## 2020-04-18 DIAGNOSIS — M9903 Segmental and somatic dysfunction of lumbar region: Secondary | ICD-10-CM | POA: Diagnosis not present

## 2020-04-18 DIAGNOSIS — M9905 Segmental and somatic dysfunction of pelvic region: Secondary | ICD-10-CM | POA: Diagnosis not present

## 2020-04-18 DIAGNOSIS — M461 Sacroiliitis, not elsewhere classified: Secondary | ICD-10-CM | POA: Diagnosis not present

## 2020-04-18 DIAGNOSIS — M9904 Segmental and somatic dysfunction of sacral region: Secondary | ICD-10-CM | POA: Diagnosis not present

## 2020-04-18 DIAGNOSIS — M9902 Segmental and somatic dysfunction of thoracic region: Secondary | ICD-10-CM | POA: Diagnosis not present

## 2020-06-01 DIAGNOSIS — Z23 Encounter for immunization: Secondary | ICD-10-CM | POA: Diagnosis not present

## 2020-06-04 DIAGNOSIS — K5730 Diverticulosis of large intestine without perforation or abscess without bleeding: Secondary | ICD-10-CM | POA: Diagnosis not present

## 2020-06-04 DIAGNOSIS — Z6829 Body mass index (BMI) 29.0-29.9, adult: Secondary | ICD-10-CM | POA: Diagnosis not present

## 2020-06-04 DIAGNOSIS — E7439 Other disorders of intestinal carbohydrate absorption: Secondary | ICD-10-CM | POA: Diagnosis not present

## 2020-06-04 DIAGNOSIS — N189 Chronic kidney disease, unspecified: Secondary | ICD-10-CM | POA: Diagnosis not present

## 2020-06-04 DIAGNOSIS — R102 Pelvic and perineal pain: Secondary | ICD-10-CM | POA: Diagnosis not present

## 2020-06-04 DIAGNOSIS — M109 Gout, unspecified: Secondary | ICD-10-CM | POA: Diagnosis not present

## 2020-06-04 DIAGNOSIS — R109 Unspecified abdominal pain: Secondary | ICD-10-CM | POA: Diagnosis not present

## 2020-06-04 DIAGNOSIS — I700 Atherosclerosis of aorta: Secondary | ICD-10-CM | POA: Diagnosis not present

## 2020-06-04 DIAGNOSIS — E1165 Type 2 diabetes mellitus with hyperglycemia: Secondary | ICD-10-CM | POA: Diagnosis not present

## 2020-06-11 DIAGNOSIS — Z85828 Personal history of other malignant neoplasm of skin: Secondary | ICD-10-CM | POA: Diagnosis not present

## 2020-06-11 DIAGNOSIS — L821 Other seborrheic keratosis: Secondary | ICD-10-CM | POA: Diagnosis not present

## 2020-06-11 DIAGNOSIS — L570 Actinic keratosis: Secondary | ICD-10-CM | POA: Diagnosis not present

## 2020-06-14 DIAGNOSIS — N189 Chronic kidney disease, unspecified: Secondary | ICD-10-CM | POA: Diagnosis not present

## 2020-06-14 DIAGNOSIS — E1122 Type 2 diabetes mellitus with diabetic chronic kidney disease: Secondary | ICD-10-CM | POA: Diagnosis not present

## 2020-06-14 DIAGNOSIS — I129 Hypertensive chronic kidney disease with stage 1 through stage 4 chronic kidney disease, or unspecified chronic kidney disease: Secondary | ICD-10-CM | POA: Diagnosis not present

## 2020-06-14 DIAGNOSIS — E7800 Pure hypercholesterolemia, unspecified: Secondary | ICD-10-CM | POA: Diagnosis not present

## 2020-06-21 DIAGNOSIS — M109 Gout, unspecified: Secondary | ICD-10-CM | POA: Diagnosis not present

## 2020-06-21 DIAGNOSIS — D638 Anemia in other chronic diseases classified elsewhere: Secondary | ICD-10-CM | POA: Diagnosis not present

## 2020-07-14 DIAGNOSIS — I129 Hypertensive chronic kidney disease with stage 1 through stage 4 chronic kidney disease, or unspecified chronic kidney disease: Secondary | ICD-10-CM | POA: Diagnosis not present

## 2020-07-14 DIAGNOSIS — N189 Chronic kidney disease, unspecified: Secondary | ICD-10-CM | POA: Diagnosis not present

## 2020-07-14 DIAGNOSIS — E7800 Pure hypercholesterolemia, unspecified: Secondary | ICD-10-CM | POA: Diagnosis not present

## 2020-07-14 DIAGNOSIS — E1122 Type 2 diabetes mellitus with diabetic chronic kidney disease: Secondary | ICD-10-CM | POA: Diagnosis not present

## 2020-07-16 DIAGNOSIS — Z23 Encounter for immunization: Secondary | ICD-10-CM | POA: Diagnosis not present

## 2020-07-16 DIAGNOSIS — I129 Hypertensive chronic kidney disease with stage 1 through stage 4 chronic kidney disease, or unspecified chronic kidney disease: Secondary | ICD-10-CM | POA: Diagnosis not present

## 2020-07-16 DIAGNOSIS — I5189 Other ill-defined heart diseases: Secondary | ICD-10-CM | POA: Diagnosis not present

## 2020-07-16 DIAGNOSIS — E1122 Type 2 diabetes mellitus with diabetic chronic kidney disease: Secondary | ICD-10-CM | POA: Diagnosis not present

## 2020-07-16 DIAGNOSIS — N2581 Secondary hyperparathyroidism of renal origin: Secondary | ICD-10-CM | POA: Diagnosis not present

## 2020-07-16 DIAGNOSIS — N183 Chronic kidney disease, stage 3 unspecified: Secondary | ICD-10-CM | POA: Diagnosis not present

## 2020-07-19 DIAGNOSIS — I5030 Unspecified diastolic (congestive) heart failure: Secondary | ICD-10-CM | POA: Insufficient documentation

## 2020-07-19 DIAGNOSIS — D638 Anemia in other chronic diseases classified elsewhere: Secondary | ICD-10-CM | POA: Insufficient documentation

## 2020-07-19 DIAGNOSIS — E1165 Type 2 diabetes mellitus with hyperglycemia: Secondary | ICD-10-CM | POA: Insufficient documentation

## 2020-07-19 DIAGNOSIS — J329 Chronic sinusitis, unspecified: Secondary | ICD-10-CM | POA: Insufficient documentation

## 2020-07-19 DIAGNOSIS — E669 Obesity, unspecified: Secondary | ICD-10-CM | POA: Insufficient documentation

## 2020-07-19 DIAGNOSIS — IMO0002 Reserved for concepts with insufficient information to code with codable children: Secondary | ICD-10-CM | POA: Insufficient documentation

## 2020-07-19 DIAGNOSIS — E213 Hyperparathyroidism, unspecified: Secondary | ICD-10-CM | POA: Insufficient documentation

## 2020-07-19 DIAGNOSIS — I10 Essential (primary) hypertension: Secondary | ICD-10-CM | POA: Insufficient documentation

## 2020-07-19 DIAGNOSIS — G629 Polyneuropathy, unspecified: Secondary | ICD-10-CM | POA: Insufficient documentation

## 2020-07-22 NOTE — Progress Notes (Signed)
Cardiology Office Note:    Date:  07/23/2020   ID:  Jason Beltran, DOB Jan 09, 1941, MRN [id number]  PCP:  Jason Sheriff, MD  Cardiologist:  Jason More, MD    Referring MD: Jason Sheriff, MD    ASSESSMENT:    1. Hypertension, renal disease, stage 1-4 or unspecified chronic kidney disease   2. Nonrheumatic aortic valve stenosis   3. Mixed hyperlipidemia    PLAN:    In order of problems listed above:  1. At target continue current treatment including low-dose beta-blocker hydralazine 2. Stable aortic stenosis 3. Continue statin lipids at target 4. Check ultrasound the kidneys for renal size with worsened CKD he sees nephrology who is managing his worsening renal function   Next appointment: 6 months   Medication Adjustments/Labs and Tests Ordered: Current medicines are reviewed at length with the patient today.  Concerns regarding medicines are outlined above.  Orders Placed This Encounter  Procedures  . US Renal   No orders of the defined types were placed in this encounter.   Chief Complaint  Patient presents with  . Follow-up  . Hypertension    History of Present Illness:    Jason Beltran is a 79 y.o. male with a hx of mild aortic stenosis type 2 diabetes mellitus hypertension with CKD and edema in the past when he took Actos.  His echocardiogram in August 2019 showed a peak and mean gradient of the aortic valve of 21 and 9 mmHg.  His most recent echocardiogram 01/16/2020 shows mild concentric LVH elevated left ventricular filling pressure normal systolic function EF 60 to 65% and continued mild aortic stenosis with peak and mean gradients of 27 and 12 mmHg.  He was 01/16/2020 last seen. Compliance with diet, lifestyle and medications: Yes  He tells me that he has been seen by nephrology CKD in [facility] for worsening renal function.  He discusses a creatinine of 2.09.  His hydralazine was increased to 50 mg 3 times a day home blood pressure early in the  morning is 150 or greater and at target later in the day no edema shortness of breath chest pain palpitation or syncope Past Medical History:  Diagnosis Date  . Anemia of chronic disease   . Aortic stenosis 05/08/2018   Very mild mean 9 mm Hg  . Chronic sinusitis   . CKD (chronic kidney disease)   . Diabetes mellitus type 2, uncontrolled ([place])   . Diastolic CHF ([place])   . Diastolic dysfunction 3/55/7322  . Essential hypertension 05/06/2018  . Hyperparathyroidism ([place])   . Hypertension   . Neuropathy, peripheral   . Obesity   . Type 2 diabetes mellitus ([place]) 05/06/2018    Past Surgical History:  Procedure Laterality Date  . ANKLE SURGERY    . NASAL SINUS SURGERY    . VASECTOMY      Current Medications: Current Meds  Medication Sig  . aspirin 81 MG EC tablet Take 81 mg by mouth daily. Swallow whole.  Marland Kitchen BIOTIN 5000 PO Take 1 tablet by mouth daily.  . carvedilol (COREG) 6.25 MG tablet Take 6.25 mg by mouth 2 (two) times daily with a meal.  . Coenzyme Q10 (COQ10) 100 MG CAPS Take 1 capsule by mouth daily.  . colchicine 0.6 MG tablet Take 0.6 mg by mouth daily. 0.5 tablet once a day  . gabapentin (NEURONTIN) 400 MG capsule Take 400 mg by mouth 2 (two) times daily.   . Glucosamine-Chondroit-Vit C-Mn (GLUCOSAMINE 1500 COMPLEX)  CAPS Take 1 capsule by mouth daily.  . hydrALAZINE (APRESOLINE) 50 MG tablet Take 50 mg by mouth 3 (three) times daily.   . Multiple Vitamin (MULTIVITAMIN) tablet Take 1 tablet by mouth daily.  . Omega-3 Fatty Acids (FISH OIL) 1200 MG CAPS Take 1,200 mg by mouth 2 (two) times daily.  . Saw Palmetto 450 MG CAPS Take 3 capsules by mouth daily.  . simvastatin (ZOCOR) 20 MG tablet Take 20 mg by mouth daily.  . Turmeric (QC TUMERIC COMPLEX PO) Take 1 tablet by mouth in the morning and at bedtime.     Allergies:   Motrin [ibuprofen]   Social History   Socioeconomic History  . Marital status: Married    Spouse name: Not on file  . Number of children: Not on file   . Years of education: Not on file  . Highest education level: Not on file  Occupational History  . Not on file  Tobacco Use  . Smoking status: Never Smoker  . Smokeless tobacco: Never Used  Vaping Use  . Vaping Use: Never used  Substance and Sexual Activity  . Alcohol use: Yes    Comment: occasional glass of wine  . Drug use: Never  . Sexual activity: Not on file  Other Topics Concern  . Not on file  Social History Narrative  . Not on file   Social Determinants of Health   Financial Resource Strain:   . Difficulty of Paying Living Expenses: Not on file  Food Insecurity:   . Worried About Charity fundraiser in the Last Year: Not on file  . Ran Out of Food in the Last Year: Not on file  Transportation Needs:   . Lack of Transportation (Medical): Not on file  . Lack of Transportation (Non-Medical): Not on file  Physical Activity:   . Days of Exercise per Week: Not on file  . Minutes of Exercise per Session: Not on file  Stress:   . Feeling of Stress : Not on file  Social Connections:   . Frequency of Communication with Friends and Family: Not on file  . Frequency of Social Gatherings with Friends and Family: Not on file  . Attends Religious Services: Not on file  . Active Member of Clubs or Organizations: Not on file  . Attends Archivist Meetings: Not on file  . Marital Status: Not on file     Family History: The patient's family history includes Heart failure in his father; Stroke in his paternal aunt and paternal grandmother. ROS:   Please see the history of present illness.    All other systems reviewed and are negative.  EKGs/Labs/Other Studies Reviewed:    The following studies were reviewed today:   Chart review shows he had a CT of the abdomen performed at [facility] 06/04/2020 showing aortic atherosclerosis and left colon diverticulosis with inflammatory stranding. Recent Labs: 09/27/2019 cholesterol 131 LDL 71 triglycerides 103 HDL 39  creatinine 06/04/2020 2.20  Physical Exam:    VS:  BP 124/70   Pulse 62   Ht 5\' 8"  (1.727 m)   Wt 196 lb 3.2 oz (89 kg)   SpO2 96%   BMI 29.83 kg/m     Wt Readings from Last 3 Encounters:  07/23/20 196 lb 3.2 oz (89 kg)  01/16/20 190 lb (86.2 kg)  12/08/19 190 lb (86.2 kg)     GEN: Elderly appearing well nourished, well developed in no acute distress HEENT: Normal NECK: No JVD; No  carotid bruits LYMPHATICS: No lymphadenopathy CARDIAC: 1/6 midsystolic aortic stenosis murmur does not encompass S2 or radiate to the carotids RRR, no murmurs, rubs, gallops RESPIRATORY:  Clear to auscultation without rales, wheezing or rhonchi  ABDOMEN: Soft, non-tender, non-distended MUSCULOSKELETAL:  No edema; No deformity  SKIN: Warm and dry NEUROLOGIC:  Alert and oriented x 3 PSYCHIATRIC:  Normal affect    Signed, Jason More, MD  07/23/2020 11:46 AM    [place]

## 2020-07-23 ENCOUNTER — Ambulatory Visit (INDEPENDENT_AMBULATORY_CARE_PROVIDER_SITE_OTHER): Payer: Medicare Other | Admitting: Cardiology

## 2020-07-23 ENCOUNTER — Encounter: Payer: Self-pay | Admitting: Cardiology

## 2020-07-23 ENCOUNTER — Other Ambulatory Visit: Payer: Self-pay

## 2020-07-23 VITALS — BP 124/70 | HR 62 | Ht 68.0 in | Wt 196.2 lb

## 2020-07-23 DIAGNOSIS — I129 Hypertensive chronic kidney disease with stage 1 through stage 4 chronic kidney disease, or unspecified chronic kidney disease: Secondary | ICD-10-CM | POA: Diagnosis not present

## 2020-07-23 DIAGNOSIS — I350 Nonrheumatic aortic (valve) stenosis: Secondary | ICD-10-CM

## 2020-07-23 DIAGNOSIS — E782 Mixed hyperlipidemia: Secondary | ICD-10-CM

## 2020-07-23 NOTE — Patient Instructions (Signed)
Medication Instructions:  Your physician recommends that you continue on your current medications as directed. Please refer to the Current Medication list given to you today.  *If you need a refill on your cardiac medications before your next appointment, please call your pharmacy*   Lab Work: None If you have labs (blood work) drawn today and your tests are completely normal, you will receive your results only by:  [place] (if you have MyChart) OR  A paper copy in the mail If you have any lab test that is abnormal or we need to change your treatment, we will call you to review the results.   Testing/Procedures: We have put in the order for you to have an ultrasound done of your Kidney's. We will call you to schedule this appointment once your insurance has authorized it.    Follow-Up: At [facility], you and your health needs are our priority.  As part of our continuing mission to provide you with exceptional heart care, we have created designated Provider Care Teams.  These Care Teams include your primary Cardiologist (physician) and Advanced Practice Providers (APPs -  Physician Assistants and Nurse Practitioners) who all work together to provide you with the care you need, when you need it.  We recommend signing up for the patient portal called "MyChart".  Sign up information is provided on this After Visit Summary.  MyChart is used to connect with patients for Virtual Visits (Telemedicine).  Patients are able to view lab/test results, encounter notes, upcoming appointments, etc.  Non-urgent messages can be sent to your provider as well.   To learn more about what you can do with MyChart, go to [email or web address].    Your next appointment:   6 month(s)  The format for your next appointment:   In Person  Provider:   Shirlee More, MD   Other Instructions

## 2020-07-25 DIAGNOSIS — R0902 Hypoxemia: Secondary | ICD-10-CM | POA: Diagnosis not present

## 2020-07-25 DIAGNOSIS — S43014A Anterior dislocation of right humerus, initial encounter: Secondary | ICD-10-CM | POA: Diagnosis not present

## 2020-07-25 DIAGNOSIS — M47816 Spondylosis without myelopathy or radiculopathy, lumbar region: Secondary | ICD-10-CM | POA: Diagnosis not present

## 2020-07-25 DIAGNOSIS — I700 Atherosclerosis of aorta: Secondary | ICD-10-CM | POA: Diagnosis not present

## 2020-07-25 DIAGNOSIS — R21 Rash and other nonspecific skin eruption: Secondary | ICD-10-CM | POA: Diagnosis not present

## 2020-07-25 DIAGNOSIS — M25511 Pain in right shoulder: Secondary | ICD-10-CM | POA: Diagnosis not present

## 2020-07-25 DIAGNOSIS — J3489 Other specified disorders of nose and nasal sinuses: Secondary | ICD-10-CM | POA: Diagnosis not present

## 2020-07-25 DIAGNOSIS — M5126 Other intervertebral disc displacement, lumbar region: Secondary | ICD-10-CM | POA: Diagnosis not present

## 2020-07-25 DIAGNOSIS — M549 Dorsalgia, unspecified: Secondary | ICD-10-CM | POA: Diagnosis not present

## 2020-07-25 DIAGNOSIS — M5020 Other cervical disc displacement, unspecified cervical region: Secondary | ICD-10-CM | POA: Diagnosis not present

## 2020-07-25 DIAGNOSIS — W19XXXA Unspecified fall, initial encounter: Secondary | ICD-10-CM | POA: Diagnosis not present

## 2020-07-25 DIAGNOSIS — M47812 Spondylosis without myelopathy or radiculopathy, cervical region: Secondary | ICD-10-CM | POA: Diagnosis not present

## 2020-07-25 DIAGNOSIS — M19011 Primary osteoarthritis, right shoulder: Secondary | ICD-10-CM | POA: Diagnosis not present

## 2020-07-25 DIAGNOSIS — I517 Cardiomegaly: Secondary | ICD-10-CM | POA: Diagnosis not present

## 2020-07-25 DIAGNOSIS — J9811 Atelectasis: Secondary | ICD-10-CM | POA: Diagnosis not present

## 2020-07-25 DIAGNOSIS — R52 Pain, unspecified: Secondary | ICD-10-CM | POA: Diagnosis not present

## 2020-07-25 DIAGNOSIS — M545 Low back pain, unspecified: Secondary | ICD-10-CM | POA: Diagnosis not present

## 2020-07-25 DIAGNOSIS — M5136 Other intervertebral disc degeneration, lumbar region: Secondary | ICD-10-CM | POA: Diagnosis not present

## 2020-07-25 DIAGNOSIS — M546 Pain in thoracic spine: Secondary | ICD-10-CM | POA: Diagnosis not present

## 2020-07-25 DIAGNOSIS — M48061 Spinal stenosis, lumbar region without neurogenic claudication: Secondary | ICD-10-CM | POA: Diagnosis not present

## 2020-07-25 DIAGNOSIS — M4807 Spinal stenosis, lumbosacral region: Secondary | ICD-10-CM | POA: Diagnosis not present

## 2020-07-25 DIAGNOSIS — S199XXA Unspecified injury of neck, initial encounter: Secondary | ICD-10-CM | POA: Diagnosis not present

## 2020-07-25 DIAGNOSIS — G319 Degenerative disease of nervous system, unspecified: Secondary | ICD-10-CM | POA: Diagnosis not present

## 2020-07-25 DIAGNOSIS — S069X9A Unspecified intracranial injury with loss of consciousness of unspecified duration, initial encounter: Secondary | ICD-10-CM | POA: Diagnosis not present

## 2020-08-14 DIAGNOSIS — E7800 Pure hypercholesterolemia, unspecified: Secondary | ICD-10-CM | POA: Diagnosis not present

## 2020-08-14 DIAGNOSIS — I129 Hypertensive chronic kidney disease with stage 1 through stage 4 chronic kidney disease, or unspecified chronic kidney disease: Secondary | ICD-10-CM | POA: Diagnosis not present

## 2020-08-14 DIAGNOSIS — E1122 Type 2 diabetes mellitus with diabetic chronic kidney disease: Secondary | ICD-10-CM | POA: Diagnosis not present

## 2020-08-14 DIAGNOSIS — N189 Chronic kidney disease, unspecified: Secondary | ICD-10-CM | POA: Diagnosis not present

## 2020-08-20 DIAGNOSIS — N183 Chronic kidney disease, stage 3 unspecified: Secondary | ICD-10-CM | POA: Diagnosis not present

## 2020-08-30 DIAGNOSIS — I10 Essential (primary) hypertension: Secondary | ICD-10-CM | POA: Diagnosis not present

## 2020-08-30 DIAGNOSIS — N189 Chronic kidney disease, unspecified: Secondary | ICD-10-CM | POA: Diagnosis not present

## 2020-08-30 DIAGNOSIS — D638 Anemia in other chronic diseases classified elsewhere: Secondary | ICD-10-CM | POA: Diagnosis not present

## 2020-08-30 DIAGNOSIS — Z6829 Body mass index (BMI) 29.0-29.9, adult: Secondary | ICD-10-CM | POA: Diagnosis not present

## 2020-08-31 ENCOUNTER — Ambulatory Visit
Admission: RE | Admit: 2020-08-31 | Discharge: 2020-08-31 | Disposition: A | Discharge status: Home / Self Care | Payer: Medicare Other | Source: Ambulatory Visit | Attending: Cardiology | Admitting: Cardiology

## 2020-08-31 DIAGNOSIS — N189 Chronic kidney disease, unspecified: Secondary | ICD-10-CM | POA: Diagnosis not present

## 2020-08-31 DIAGNOSIS — N281 Cyst of kidney, acquired: Secondary | ICD-10-CM | POA: Diagnosis not present

## 2020-09-10 ENCOUNTER — Telehealth: Payer: Self-pay

## 2020-09-10 NOTE — Telephone Encounter (Signed)
Spoke with patient regarding results and recommendation.  Patient verbalizes understanding and is agreeable to plan of care. Advised patient to call back with any issues or concerns.  

## 2020-09-10 NOTE — Telephone Encounter (Signed)
Left message on patients voicemail to please return our call.   

## 2020-09-13 DIAGNOSIS — N189 Chronic kidney disease, unspecified: Secondary | ICD-10-CM | POA: Diagnosis not present

## 2020-09-13 DIAGNOSIS — E1122 Type 2 diabetes mellitus with diabetic chronic kidney disease: Secondary | ICD-10-CM | POA: Diagnosis not present

## 2020-09-13 DIAGNOSIS — E7800 Pure hypercholesterolemia, unspecified: Secondary | ICD-10-CM | POA: Diagnosis not present

## 2020-09-13 DIAGNOSIS — I129 Hypertensive chronic kidney disease with stage 1 through stage 4 chronic kidney disease, or unspecified chronic kidney disease: Secondary | ICD-10-CM | POA: Diagnosis not present

## 2020-09-26 DIAGNOSIS — R509 Fever, unspecified: Secondary | ICD-10-CM | POA: Diagnosis not present

## 2020-09-26 DIAGNOSIS — R9431 Abnormal electrocardiogram [ECG] [EKG]: Secondary | ICD-10-CM | POA: Diagnosis not present

## 2020-09-26 DIAGNOSIS — N179 Acute kidney failure, unspecified: Secondary | ICD-10-CM | POA: Diagnosis not present

## 2020-09-26 DIAGNOSIS — E871 Hypo-osmolality and hyponatremia: Secondary | ICD-10-CM | POA: Diagnosis present

## 2020-09-26 DIAGNOSIS — E860 Dehydration: Secondary | ICD-10-CM | POA: Diagnosis present

## 2020-09-26 DIAGNOSIS — U071 COVID-19: Secondary | ICD-10-CM | POA: Diagnosis not present

## 2020-09-26 DIAGNOSIS — E785 Hyperlipidemia, unspecified: Secondary | ICD-10-CM | POA: Diagnosis not present

## 2020-09-26 DIAGNOSIS — I129 Hypertensive chronic kidney disease with stage 1 through stage 4 chronic kidney disease, or unspecified chronic kidney disease: Secondary | ICD-10-CM | POA: Diagnosis not present

## 2020-09-26 DIAGNOSIS — Z79899 Other long term (current) drug therapy: Secondary | ICD-10-CM | POA: Diagnosis not present

## 2020-09-26 DIAGNOSIS — E1122 Type 2 diabetes mellitus with diabetic chronic kidney disease: Secondary | ICD-10-CM | POA: Diagnosis present

## 2020-09-26 DIAGNOSIS — N183 Chronic kidney disease, stage 3 unspecified: Secondary | ICD-10-CM | POA: Diagnosis present

## 2020-09-26 DIAGNOSIS — K219 Gastro-esophageal reflux disease without esophagitis: Secondary | ICD-10-CM | POA: Diagnosis present

## 2020-09-26 DIAGNOSIS — Z7982 Long term (current) use of aspirin: Secondary | ICD-10-CM | POA: Diagnosis not present

## 2020-10-04 DIAGNOSIS — J189 Pneumonia, unspecified organism: Secondary | ICD-10-CM | POA: Diagnosis not present

## 2020-10-04 DIAGNOSIS — Z8616 Personal history of COVID-19: Secondary | ICD-10-CM | POA: Diagnosis not present

## 2020-10-04 DIAGNOSIS — N189 Chronic kidney disease, unspecified: Secondary | ICD-10-CM | POA: Diagnosis not present

## 2020-10-14 DIAGNOSIS — E7800 Pure hypercholesterolemia, unspecified: Secondary | ICD-10-CM | POA: Diagnosis not present

## 2020-10-14 DIAGNOSIS — E1122 Type 2 diabetes mellitus with diabetic chronic kidney disease: Secondary | ICD-10-CM | POA: Diagnosis not present

## 2020-10-14 DIAGNOSIS — N189 Chronic kidney disease, unspecified: Secondary | ICD-10-CM | POA: Diagnosis not present

## 2020-10-14 DIAGNOSIS — I129 Hypertensive chronic kidney disease with stage 1 through stage 4 chronic kidney disease, or unspecified chronic kidney disease: Secondary | ICD-10-CM | POA: Diagnosis not present

## 2020-11-09 DIAGNOSIS — N183 Chronic kidney disease, stage 3 unspecified: Secondary | ICD-10-CM | POA: Diagnosis not present

## 2020-11-12 DIAGNOSIS — G629 Polyneuropathy, unspecified: Secondary | ICD-10-CM | POA: Diagnosis not present

## 2020-11-12 DIAGNOSIS — N183 Chronic kidney disease, stage 3 unspecified: Secondary | ICD-10-CM | POA: Diagnosis not present

## 2020-11-12 DIAGNOSIS — J301 Allergic rhinitis due to pollen: Secondary | ICD-10-CM | POA: Diagnosis not present

## 2020-11-12 DIAGNOSIS — E7800 Pure hypercholesterolemia, unspecified: Secondary | ICD-10-CM | POA: Diagnosis not present

## 2020-11-12 DIAGNOSIS — Z0000 Encounter for general adult medical examination without abnormal findings: Secondary | ICD-10-CM | POA: Diagnosis not present

## 2020-11-12 DIAGNOSIS — Z6828 Body mass index (BMI) 28.0-28.9, adult: Secondary | ICD-10-CM | POA: Diagnosis not present

## 2020-11-12 DIAGNOSIS — N189 Chronic kidney disease, unspecified: Secondary | ICD-10-CM | POA: Diagnosis not present

## 2020-11-12 DIAGNOSIS — Z79899 Other long term (current) drug therapy: Secondary | ICD-10-CM | POA: Diagnosis not present

## 2020-11-12 DIAGNOSIS — I10 Essential (primary) hypertension: Secondary | ICD-10-CM | POA: Diagnosis not present

## 2020-11-19 DIAGNOSIS — E1122 Type 2 diabetes mellitus with diabetic chronic kidney disease: Secondary | ICD-10-CM | POA: Diagnosis not present

## 2020-11-19 DIAGNOSIS — I129 Hypertensive chronic kidney disease with stage 1 through stage 4 chronic kidney disease, or unspecified chronic kidney disease: Secondary | ICD-10-CM | POA: Diagnosis not present

## 2020-11-19 DIAGNOSIS — N183 Chronic kidney disease, stage 3 unspecified: Secondary | ICD-10-CM | POA: Diagnosis not present

## 2020-11-19 DIAGNOSIS — N2581 Secondary hyperparathyroidism of renal origin: Secondary | ICD-10-CM | POA: Diagnosis not present

## 2020-11-19 DIAGNOSIS — I5189 Other ill-defined heart diseases: Secondary | ICD-10-CM | POA: Diagnosis not present

## 2020-12-04 DIAGNOSIS — M9904 Segmental and somatic dysfunction of sacral region: Secondary | ICD-10-CM | POA: Diagnosis not present

## 2020-12-04 DIAGNOSIS — M9903 Segmental and somatic dysfunction of lumbar region: Secondary | ICD-10-CM | POA: Diagnosis not present

## 2020-12-04 DIAGNOSIS — M461 Sacroiliitis, not elsewhere classified: Secondary | ICD-10-CM | POA: Diagnosis not present

## 2020-12-04 DIAGNOSIS — M9905 Segmental and somatic dysfunction of pelvic region: Secondary | ICD-10-CM | POA: Diagnosis not present

## 2020-12-04 DIAGNOSIS — M5451 Vertebrogenic low back pain: Secondary | ICD-10-CM | POA: Diagnosis not present

## 2020-12-04 DIAGNOSIS — M9902 Segmental and somatic dysfunction of thoracic region: Secondary | ICD-10-CM | POA: Diagnosis not present

## 2020-12-06 NOTE — Progress Notes (Unsigned)
Cardiology Office Note:    Date:  12/07/2020   ID:  Jason Beltran, DOB 09-07-1941, MRN [id number]  PCP:  Angelina Sheriff, MD  Cardiologist:  Shirlee More, MD    Referring MD: Angelina Sheriff, MD    ASSESSMENT:    1. Nonrheumatic aortic valve stenosis   2. Hypertension, renal disease, stage 1-4 or unspecified chronic kidney disease   3. Stage 4 chronic kidney disease ([place])   4. COVID-19 virus infection   5. Mixed hyperlipidemia    PLAN:    In order of problems listed above:  1. Stable mild aortic stenosis we will recheck echocardiogram around the time of his next visit generally progression is quite slow. 2. Stable at target managed by nephrology and it sounds like his kidney disease has worsened both his creatinine as well as the new finding of proteinuria.  Current medical regimen consist of his beta-blocker and hydralazine he is not on a diuretic.  Previously with a calcium channel blocker he had severe edema 3. Fortunately was fully vaccinated when he had COVID-19 had only a brief hospitalization does not appear to have organ damage but does have some element of functional change as a consequence of that in his fall 4. Stable continue his intermediate intensity statin lipids are at target   Next appointment: 6 months   Medication Adjustments/Labs and Tests Ordered: Current medicines are reviewed at length with the patient today.  Concerns regarding medicines are outlined above.  No orders of the defined types were placed in this encounter.  No orders of the defined types were placed in this encounter.   Chief Complaint  Patient presents with  . Follow-up  . Hypertension  . Aortic Stenosis    History of Present Illness:    Jason Beltran is a 80 y.o. male with a hx of  mild aortic stenosis type 2 diabetes mellitus hypertension with CKD and edema in the past when he took Actos.  His echocardiogram in August 2019 showed a peak and mean gradient of the aortic valve  of 21 and 9 mmHg.  His most recent echocardiogram 01/16/2020 shows mild concentric LVH elevated left ventricular filling pressure normal systolic function EF 60 to 65% and continued mild aortic stenosis with peak and mean gradients of 27 and 12 mmHg. He was last seen 07/23/2020.  Compliance with diet, lifestyle and medications: Yes  Seen in the ED at [facility] through 09/26/2020 with Covid symptoms after testing positive for Covid with a fever of 103.  In the emergency room temperature was 99.6 blood pressure 92/59 heart rate 62 bpm.  CBC showed a hemoglobin 10.0 platelets mildly decreased 97,000 white count 7300 creatinine 2.30 GFR 23 cc EKG showed sinus bradycardia independently reviewed by me no ischemic changes chest x-ray was normal he was not hypoxic and he was admitted and because of hypotension received IV fluids renal function improved creatinine decreasing from 2.7-2.1 and his usual antihypertensive agents were reintroduced.  His inflammatory marker severely elevated C-reactive protein 33.2 D-dimer elevated 687 proBNP level elevated at 1700.  Jason Beltran stopped me in town a week ago to tell me that he wanted to come in and see me in the office. He is not doing as well as a consequence of a fall and concussion in November evaluated at [facility] ED including CT of the head with persistent headache and some dizziness and jaw pain.  Subsequently had COVID-19 and a brief hospitalization.  He relates he is  seeing nephrology now has proteinuria. From a cardiac perspective no palpitations syncope chest pain edema or shortness of breath. His last echocardiogram within a year showed mild aortic stenosis. Past Medical History:  Diagnosis Date  . Anemia of chronic disease   . Aortic stenosis 05/08/2018   Very mild mean 9 mm Hg  . Chronic sinusitis   . CKD (chronic kidney disease)   . Diabetes mellitus type 2, uncontrolled ([place])   . Diastolic CHF ([place])   . Diastolic dysfunction 02/19/3015   . Essential hypertension 05/06/2018  . Hyperparathyroidism ([place])   . Hypertension   . Neuropathy, peripheral   . Obesity   . Type 2 diabetes mellitus ([place]) 05/06/2018    Past Surgical History:  Procedure Laterality Date  . ANKLE SURGERY    . NASAL SINUS SURGERY    . VASECTOMY      Current Medications: Current Meds  Medication Sig  . aspirin 81 MG EC tablet Take 81 mg by mouth daily. Swallow whole.  Marland Kitchen BIOTIN 5000 PO Take 1 tablet by mouth daily.  . carvedilol (COREG) 6.25 MG tablet Take 6.25 mg by mouth 2 (two) times daily with a meal.  . Coenzyme Q10 (COQ10) 100 MG CAPS Take 1 capsule by mouth daily.  . colchicine 0.6 MG tablet Take 0.6 mg by mouth daily. 0.5 tablet once a day  . febuxostat (ULORIC) 40 MG tablet Take 1 tablet by mouth daily.  Marland Kitchen gabapentin (NEURONTIN) 400 MG capsule Take 400 mg by mouth 2 (two) times daily.   . Glucosamine-Chondroit-Vit C-Mn (GLUCOSAMINE 1500 COMPLEX) CAPS Take 1 capsule by mouth daily.  . hydrALAZINE (APRESOLINE) 50 MG tablet Take 25 mg by mouth 3 (three) times daily.  . Multiple Vitamin (MULTIVITAMIN) tablet Take 1 tablet by mouth daily.  . Omega-3 Fatty Acids (FISH OIL) 1200 MG CAPS Take 1,200 mg by mouth 2 (two) times daily.  . Saw Palmetto 450 MG CAPS Take 3 capsules by mouth daily.  . simvastatin (ZOCOR) 20 MG tablet Take 20 mg by mouth daily.  . Turmeric (QC TUMERIC COMPLEX PO) Take 1 tablet by mouth in the morning and at bedtime.     Allergies:   Motrin [ibuprofen]   Social History   Socioeconomic History  . Marital status: Married    Spouse name: Not on file  . Number of children: Not on file  . Years of education: Not on file  . Highest education level: Not on file  Occupational History  . Not on file  Tobacco Use  . Smoking status: Never Smoker  . Smokeless tobacco: Never Used  Vaping Use  . Vaping Use: Never used  Substance and Sexual Activity  . Alcohol use: Yes    Comment: occasional glass of wine  . Drug use: Never   . Sexual activity: Not on file  Other Topics Concern  . Not on file  Social History Narrative  . Not on file   Social Determinants of Health   Financial Resource Strain: Not on file  Food Insecurity: Not on file  Transportation Needs: Not on file  Physical Activity: Not on file  Stress: Not on file  Social Connections: Not on file     Family History: The patient's family history includes Heart failure in his father; Stroke in his paternal aunt and paternal grandmother. ROS:   Please see the history of present illness.    All other systems reviewed and are negative.  EKGs/Labs/Other Studies Reviewed:    The following studies  were reviewed today:  EKG:  EKG ordered today and personally reviewed.  The ekg ordered today demonstrates sinus rhythm with frequent atrial premature contractions  Recent Labs: 11/12/2020 cholesterol 121 triglycerides 147 HDL 36 A1c 5.5% creatinine 2.20 potassium 3.9   Physical Exam:    VS:  BP 118/60 (BP Location: Right Arm, Patient Position: Sitting, Cuff Size: Normal)   Pulse 63   Ht 5\' 8"  (1.727 m)   Wt 194 lb 3.2 oz (88.1 kg)   SpO2 97%   BMI 29.53 kg/m     Wt Readings from Last 3 Encounters:  12/07/20 194 lb 3.2 oz (88.1 kg)  07/23/20 196 lb 3.2 oz (89 kg)  01/16/20 190 lb (86.2 kg)     GEN: He appears his age he is slower in his movement and ambulation well nourished, well developed in no acute distress HEENT: Normal NECK: No JVD; No carotid bruits LYMPHATICS: No lymphadenopathy CARDIAC: 1-2 of 6 localized murmur AAS midsystolic aortic area does not radiate to the carotids does not encompass S2 no aortic regurgitation.  RRR,rubs, gallops RESPIRATORY:  Clear to auscultation without rales, wheezing or rhonchi  ABDOMEN: Soft, non-tender, non-distended MUSCULOSKELETAL:  No edema; No deformity  SKIN: Warm and dry NEUROLOGIC:  Alert and oriented x 3 PSYCHIATRIC:  Normal affect    Signed, Shirlee More, MD  12/07/2020 12:00 PM     [place]

## 2020-12-07 ENCOUNTER — Encounter: Payer: Self-pay | Admitting: Cardiology

## 2020-12-07 ENCOUNTER — Ambulatory Visit (INDEPENDENT_AMBULATORY_CARE_PROVIDER_SITE_OTHER): Payer: Medicare Other | Admitting: Cardiology

## 2020-12-07 ENCOUNTER — Other Ambulatory Visit: Payer: Self-pay

## 2020-12-07 VITALS — BP 118/60 | HR 63 | Ht 68.0 in | Wt 194.2 lb

## 2020-12-07 DIAGNOSIS — I129 Hypertensive chronic kidney disease with stage 1 through stage 4 chronic kidney disease, or unspecified chronic kidney disease: Secondary | ICD-10-CM

## 2020-12-07 DIAGNOSIS — M9902 Segmental and somatic dysfunction of thoracic region: Secondary | ICD-10-CM | POA: Diagnosis not present

## 2020-12-07 DIAGNOSIS — M9905 Segmental and somatic dysfunction of pelvic region: Secondary | ICD-10-CM | POA: Diagnosis not present

## 2020-12-07 DIAGNOSIS — U071 COVID-19: Secondary | ICD-10-CM | POA: Diagnosis not present

## 2020-12-07 DIAGNOSIS — E782 Mixed hyperlipidemia: Secondary | ICD-10-CM

## 2020-12-07 DIAGNOSIS — N184 Chronic kidney disease, stage 4 (severe): Secondary | ICD-10-CM

## 2020-12-07 DIAGNOSIS — M9903 Segmental and somatic dysfunction of lumbar region: Secondary | ICD-10-CM | POA: Diagnosis not present

## 2020-12-07 DIAGNOSIS — M9904 Segmental and somatic dysfunction of sacral region: Secondary | ICD-10-CM | POA: Diagnosis not present

## 2020-12-07 DIAGNOSIS — I350 Nonrheumatic aortic (valve) stenosis: Secondary | ICD-10-CM | POA: Diagnosis not present

## 2020-12-07 DIAGNOSIS — M5451 Vertebrogenic low back pain: Secondary | ICD-10-CM | POA: Diagnosis not present

## 2020-12-07 DIAGNOSIS — M461 Sacroiliitis, not elsewhere classified: Secondary | ICD-10-CM | POA: Diagnosis not present

## 2020-12-07 HISTORY — DX: COVID-19: U07.1

## 2020-12-07 NOTE — Patient Instructions (Signed)

## 2020-12-10 DIAGNOSIS — L565 Disseminated superficial actinic porokeratosis (DSAP): Secondary | ICD-10-CM | POA: Diagnosis not present

## 2020-12-10 DIAGNOSIS — L570 Actinic keratosis: Secondary | ICD-10-CM | POA: Diagnosis not present

## 2020-12-10 DIAGNOSIS — D225 Melanocytic nevi of trunk: Secondary | ICD-10-CM | POA: Diagnosis not present

## 2020-12-10 DIAGNOSIS — Z85828 Personal history of other malignant neoplasm of skin: Secondary | ICD-10-CM | POA: Diagnosis not present

## 2020-12-10 DIAGNOSIS — L821 Other seborrheic keratosis: Secondary | ICD-10-CM | POA: Diagnosis not present

## 2020-12-10 NOTE — Addendum Note (Signed)
Addended by: Jerl Santos R on: 12/10/2020 02:15 PM   Modules accepted: Orders

## 2020-12-13 DIAGNOSIS — M461 Sacroiliitis, not elsewhere classified: Secondary | ICD-10-CM | POA: Diagnosis not present

## 2020-12-13 DIAGNOSIS — M9903 Segmental and somatic dysfunction of lumbar region: Secondary | ICD-10-CM | POA: Diagnosis not present

## 2020-12-13 DIAGNOSIS — M9904 Segmental and somatic dysfunction of sacral region: Secondary | ICD-10-CM | POA: Diagnosis not present

## 2020-12-13 DIAGNOSIS — M9902 Segmental and somatic dysfunction of thoracic region: Secondary | ICD-10-CM | POA: Diagnosis not present

## 2020-12-13 DIAGNOSIS — M5451 Vertebrogenic low back pain: Secondary | ICD-10-CM | POA: Diagnosis not present

## 2020-12-13 DIAGNOSIS — M9905 Segmental and somatic dysfunction of pelvic region: Secondary | ICD-10-CM | POA: Diagnosis not present

## 2020-12-19 DIAGNOSIS — M5451 Vertebrogenic low back pain: Secondary | ICD-10-CM | POA: Diagnosis not present

## 2020-12-19 DIAGNOSIS — M9904 Segmental and somatic dysfunction of sacral region: Secondary | ICD-10-CM | POA: Diagnosis not present

## 2020-12-19 DIAGNOSIS — M9902 Segmental and somatic dysfunction of thoracic region: Secondary | ICD-10-CM | POA: Diagnosis not present

## 2020-12-19 DIAGNOSIS — M9905 Segmental and somatic dysfunction of pelvic region: Secondary | ICD-10-CM | POA: Diagnosis not present

## 2020-12-19 DIAGNOSIS — M9903 Segmental and somatic dysfunction of lumbar region: Secondary | ICD-10-CM | POA: Diagnosis not present

## 2020-12-19 DIAGNOSIS — M461 Sacroiliitis, not elsewhere classified: Secondary | ICD-10-CM | POA: Diagnosis not present

## 2020-12-25 DIAGNOSIS — M9903 Segmental and somatic dysfunction of lumbar region: Secondary | ICD-10-CM | POA: Diagnosis not present

## 2020-12-25 DIAGNOSIS — M9904 Segmental and somatic dysfunction of sacral region: Secondary | ICD-10-CM | POA: Diagnosis not present

## 2020-12-25 DIAGNOSIS — M9905 Segmental and somatic dysfunction of pelvic region: Secondary | ICD-10-CM | POA: Diagnosis not present

## 2020-12-25 DIAGNOSIS — M5451 Vertebrogenic low back pain: Secondary | ICD-10-CM | POA: Diagnosis not present

## 2020-12-25 DIAGNOSIS — M9902 Segmental and somatic dysfunction of thoracic region: Secondary | ICD-10-CM | POA: Diagnosis not present

## 2020-12-25 DIAGNOSIS — M461 Sacroiliitis, not elsewhere classified: Secondary | ICD-10-CM | POA: Diagnosis not present

## 2021-01-01 DIAGNOSIS — M9903 Segmental and somatic dysfunction of lumbar region: Secondary | ICD-10-CM | POA: Diagnosis not present

## 2021-01-01 DIAGNOSIS — M9904 Segmental and somatic dysfunction of sacral region: Secondary | ICD-10-CM | POA: Diagnosis not present

## 2021-01-01 DIAGNOSIS — M5451 Vertebrogenic low back pain: Secondary | ICD-10-CM | POA: Diagnosis not present

## 2021-01-01 DIAGNOSIS — M9902 Segmental and somatic dysfunction of thoracic region: Secondary | ICD-10-CM | POA: Diagnosis not present

## 2021-01-01 DIAGNOSIS — M461 Sacroiliitis, not elsewhere classified: Secondary | ICD-10-CM | POA: Diagnosis not present

## 2021-01-01 DIAGNOSIS — M9905 Segmental and somatic dysfunction of pelvic region: Secondary | ICD-10-CM | POA: Diagnosis not present

## 2021-01-08 DIAGNOSIS — M9902 Segmental and somatic dysfunction of thoracic region: Secondary | ICD-10-CM | POA: Diagnosis not present

## 2021-01-08 DIAGNOSIS — M9905 Segmental and somatic dysfunction of pelvic region: Secondary | ICD-10-CM | POA: Diagnosis not present

## 2021-01-08 DIAGNOSIS — M9903 Segmental and somatic dysfunction of lumbar region: Secondary | ICD-10-CM | POA: Diagnosis not present

## 2021-01-08 DIAGNOSIS — M9904 Segmental and somatic dysfunction of sacral region: Secondary | ICD-10-CM | POA: Diagnosis not present

## 2021-01-08 DIAGNOSIS — M461 Sacroiliitis, not elsewhere classified: Secondary | ICD-10-CM | POA: Diagnosis not present

## 2021-01-08 DIAGNOSIS — M5451 Vertebrogenic low back pain: Secondary | ICD-10-CM | POA: Diagnosis not present

## 2021-01-12 DIAGNOSIS — E1122 Type 2 diabetes mellitus with diabetic chronic kidney disease: Secondary | ICD-10-CM | POA: Diagnosis not present

## 2021-01-12 DIAGNOSIS — I129 Hypertensive chronic kidney disease with stage 1 through stage 4 chronic kidney disease, or unspecified chronic kidney disease: Secondary | ICD-10-CM | POA: Diagnosis not present

## 2021-01-12 DIAGNOSIS — N189 Chronic kidney disease, unspecified: Secondary | ICD-10-CM | POA: Diagnosis not present

## 2021-01-12 DIAGNOSIS — E7800 Pure hypercholesterolemia, unspecified: Secondary | ICD-10-CM | POA: Diagnosis not present

## 2021-01-15 DIAGNOSIS — M461 Sacroiliitis, not elsewhere classified: Secondary | ICD-10-CM | POA: Diagnosis not present

## 2021-01-15 DIAGNOSIS — M5451 Vertebrogenic low back pain: Secondary | ICD-10-CM | POA: Diagnosis not present

## 2021-01-15 DIAGNOSIS — M9903 Segmental and somatic dysfunction of lumbar region: Secondary | ICD-10-CM | POA: Diagnosis not present

## 2021-01-15 DIAGNOSIS — M9904 Segmental and somatic dysfunction of sacral region: Secondary | ICD-10-CM | POA: Diagnosis not present

## 2021-01-15 DIAGNOSIS — M9902 Segmental and somatic dysfunction of thoracic region: Secondary | ICD-10-CM | POA: Diagnosis not present

## 2021-01-15 DIAGNOSIS — M9905 Segmental and somatic dysfunction of pelvic region: Secondary | ICD-10-CM | POA: Diagnosis not present

## 2021-01-29 DIAGNOSIS — M5451 Vertebrogenic low back pain: Secondary | ICD-10-CM | POA: Diagnosis not present

## 2021-01-29 DIAGNOSIS — M9902 Segmental and somatic dysfunction of thoracic region: Secondary | ICD-10-CM | POA: Diagnosis not present

## 2021-01-29 DIAGNOSIS — M9903 Segmental and somatic dysfunction of lumbar region: Secondary | ICD-10-CM | POA: Diagnosis not present

## 2021-01-29 DIAGNOSIS — M9905 Segmental and somatic dysfunction of pelvic region: Secondary | ICD-10-CM | POA: Diagnosis not present

## 2021-01-29 DIAGNOSIS — M9904 Segmental and somatic dysfunction of sacral region: Secondary | ICD-10-CM | POA: Diagnosis not present

## 2021-01-29 DIAGNOSIS — M461 Sacroiliitis, not elsewhere classified: Secondary | ICD-10-CM | POA: Diagnosis not present

## 2021-02-12 DIAGNOSIS — M9903 Segmental and somatic dysfunction of lumbar region: Secondary | ICD-10-CM | POA: Diagnosis not present

## 2021-02-12 DIAGNOSIS — M461 Sacroiliitis, not elsewhere classified: Secondary | ICD-10-CM | POA: Diagnosis not present

## 2021-02-12 DIAGNOSIS — M9905 Segmental and somatic dysfunction of pelvic region: Secondary | ICD-10-CM | POA: Diagnosis not present

## 2021-02-12 DIAGNOSIS — M9904 Segmental and somatic dysfunction of sacral region: Secondary | ICD-10-CM | POA: Diagnosis not present

## 2021-02-12 DIAGNOSIS — M5451 Vertebrogenic low back pain: Secondary | ICD-10-CM | POA: Diagnosis not present

## 2021-02-12 DIAGNOSIS — N183 Chronic kidney disease, stage 3 unspecified: Secondary | ICD-10-CM | POA: Diagnosis not present

## 2021-02-12 DIAGNOSIS — M9902 Segmental and somatic dysfunction of thoracic region: Secondary | ICD-10-CM | POA: Diagnosis not present

## 2021-02-13 DIAGNOSIS — I5189 Other ill-defined heart diseases: Secondary | ICD-10-CM | POA: Diagnosis not present

## 2021-02-13 DIAGNOSIS — N183 Chronic kidney disease, stage 3 unspecified: Secondary | ICD-10-CM | POA: Diagnosis not present

## 2021-02-13 DIAGNOSIS — I129 Hypertensive chronic kidney disease with stage 1 through stage 4 chronic kidney disease, or unspecified chronic kidney disease: Secondary | ICD-10-CM | POA: Diagnosis not present

## 2021-02-13 DIAGNOSIS — E1122 Type 2 diabetes mellitus with diabetic chronic kidney disease: Secondary | ICD-10-CM | POA: Diagnosis not present

## 2021-02-13 DIAGNOSIS — N2581 Secondary hyperparathyroidism of renal origin: Secondary | ICD-10-CM | POA: Diagnosis not present

## 2021-03-14 DIAGNOSIS — N189 Chronic kidney disease, unspecified: Secondary | ICD-10-CM | POA: Diagnosis not present

## 2021-03-14 DIAGNOSIS — I129 Hypertensive chronic kidney disease with stage 1 through stage 4 chronic kidney disease, or unspecified chronic kidney disease: Secondary | ICD-10-CM | POA: Diagnosis not present

## 2021-03-14 DIAGNOSIS — E7800 Pure hypercholesterolemia, unspecified: Secondary | ICD-10-CM | POA: Diagnosis not present

## 2021-03-14 DIAGNOSIS — E1122 Type 2 diabetes mellitus with diabetic chronic kidney disease: Secondary | ICD-10-CM | POA: Diagnosis not present

## 2021-04-01 DIAGNOSIS — N183 Chronic kidney disease, stage 3 unspecified: Secondary | ICD-10-CM | POA: Diagnosis not present

## 2021-04-03 DIAGNOSIS — I129 Hypertensive chronic kidney disease with stage 1 through stage 4 chronic kidney disease, or unspecified chronic kidney disease: Secondary | ICD-10-CM | POA: Diagnosis not present

## 2021-04-03 DIAGNOSIS — N2581 Secondary hyperparathyroidism of renal origin: Secondary | ICD-10-CM | POA: Diagnosis not present

## 2021-04-03 DIAGNOSIS — N183 Chronic kidney disease, stage 3 unspecified: Secondary | ICD-10-CM | POA: Diagnosis not present

## 2021-04-03 DIAGNOSIS — E1122 Type 2 diabetes mellitus with diabetic chronic kidney disease: Secondary | ICD-10-CM | POA: Diagnosis not present

## 2021-04-03 DIAGNOSIS — I5189 Other ill-defined heart diseases: Secondary | ICD-10-CM | POA: Diagnosis not present

## 2021-04-14 DIAGNOSIS — I129 Hypertensive chronic kidney disease with stage 1 through stage 4 chronic kidney disease, or unspecified chronic kidney disease: Secondary | ICD-10-CM | POA: Diagnosis not present

## 2021-04-14 DIAGNOSIS — E7800 Pure hypercholesterolemia, unspecified: Secondary | ICD-10-CM | POA: Diagnosis not present

## 2021-04-14 DIAGNOSIS — N189 Chronic kidney disease, unspecified: Secondary | ICD-10-CM | POA: Diagnosis not present

## 2021-04-14 DIAGNOSIS — E1122 Type 2 diabetes mellitus with diabetic chronic kidney disease: Secondary | ICD-10-CM | POA: Diagnosis not present

## 2021-04-15 DIAGNOSIS — N189 Chronic kidney disease, unspecified: Secondary | ICD-10-CM | POA: Diagnosis not present

## 2021-04-15 DIAGNOSIS — Z1331 Encounter for screening for depression: Secondary | ICD-10-CM | POA: Diagnosis not present

## 2021-04-15 DIAGNOSIS — Z9181 History of falling: Secondary | ICD-10-CM | POA: Diagnosis not present

## 2021-04-15 DIAGNOSIS — Z6828 Body mass index (BMI) 28.0-28.9, adult: Secondary | ICD-10-CM | POA: Diagnosis not present

## 2021-04-22 DIAGNOSIS — N183 Chronic kidney disease, stage 3 unspecified: Secondary | ICD-10-CM | POA: Diagnosis not present

## 2021-04-25 IMAGING — US US RENAL
1 series · 14 of 25 positions shown · non-contrast
Comparison: Prior CT from 06/04/2020.

CLINICAL DATA: Initial evaluation for chronic kidney disease.

EXAM:
RENAL / URINARY TRACT ULTRASOUND COMPLETE

[Series 1: us renal · 0.23mm/px · 14 of 37 slices shown]
[im 1/37]
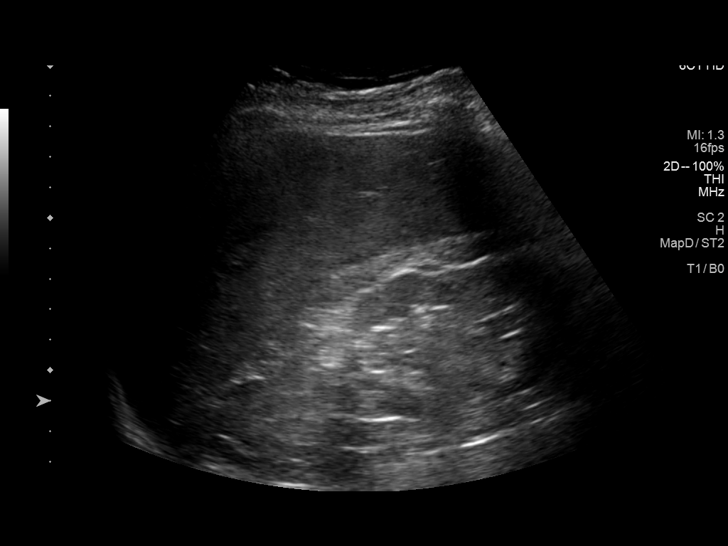
[im 4/37]
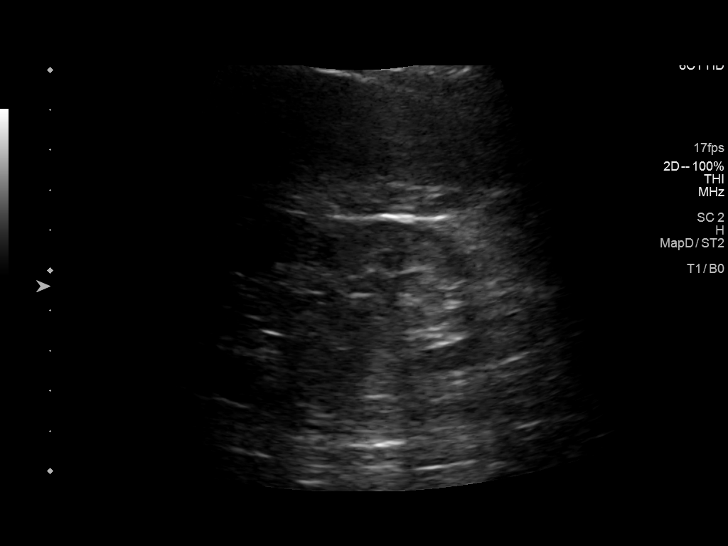
[im 7/37]
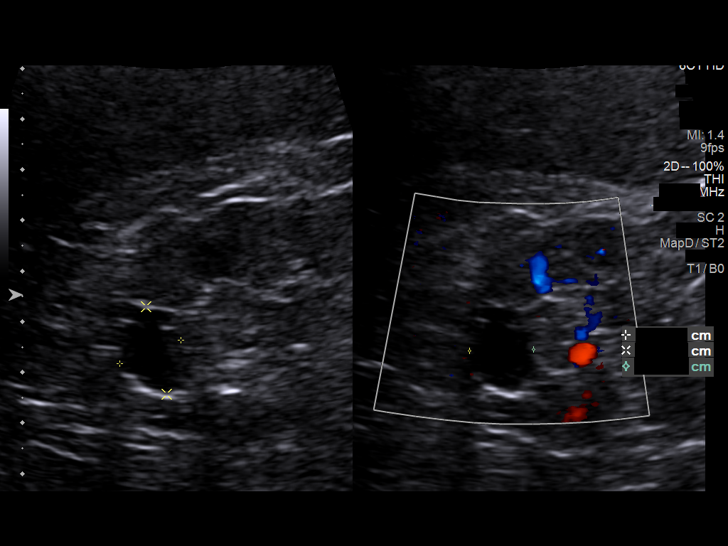
[im 10/37]
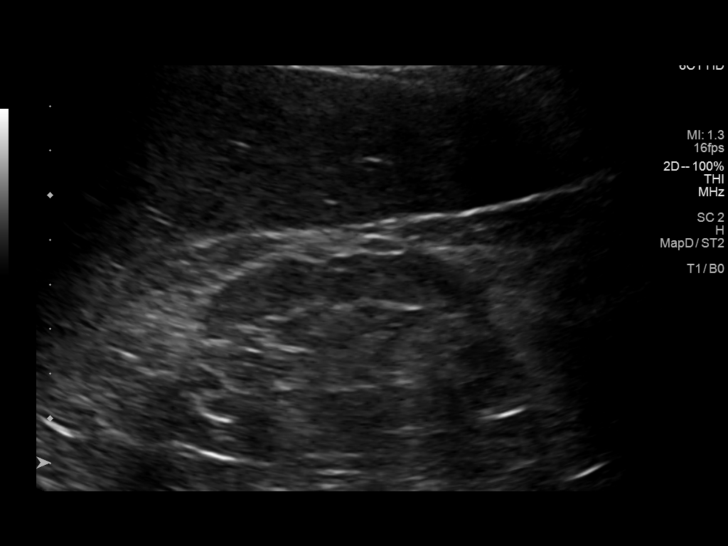
[im 13/37]
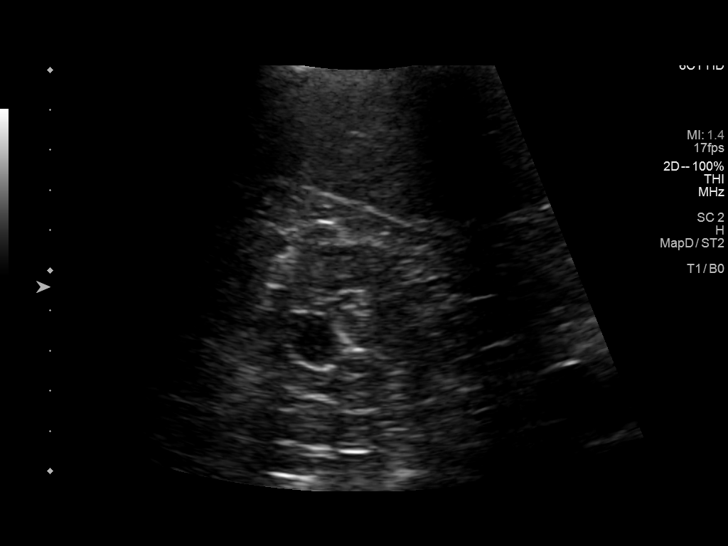
[im 14/37]
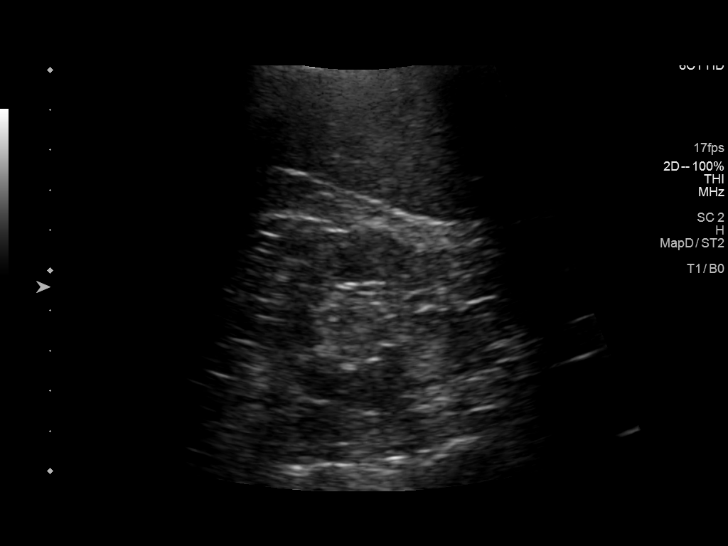
[im 17/37]
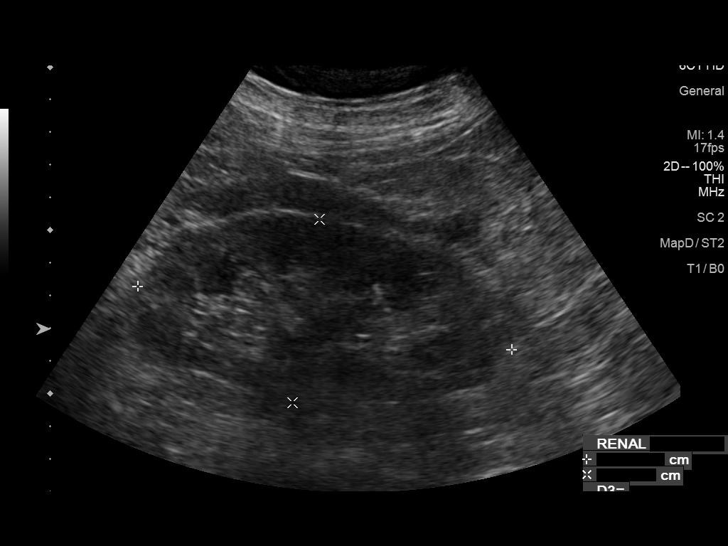
[im 20/37]
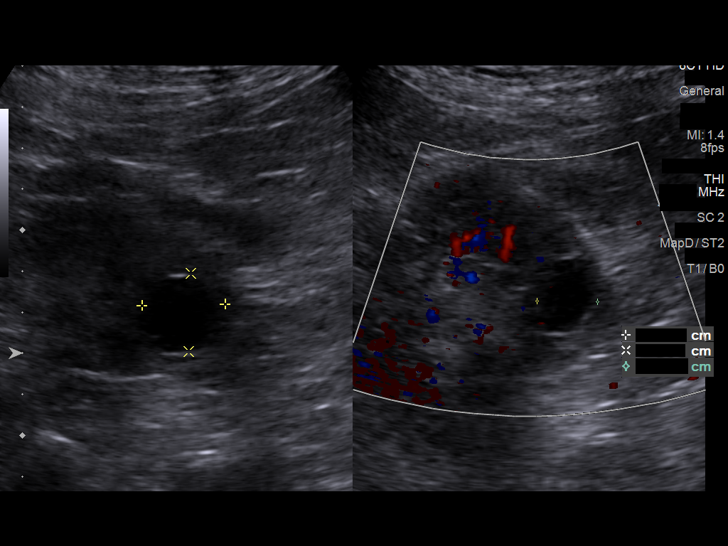
[im 23/37]
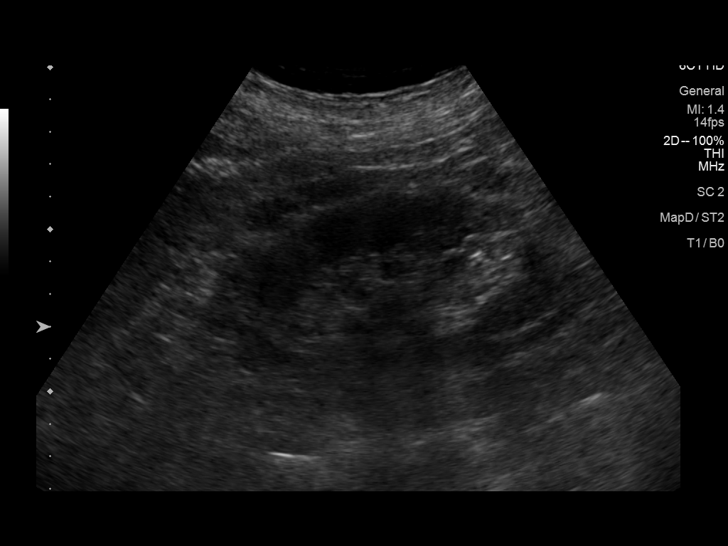
[im 25/37]
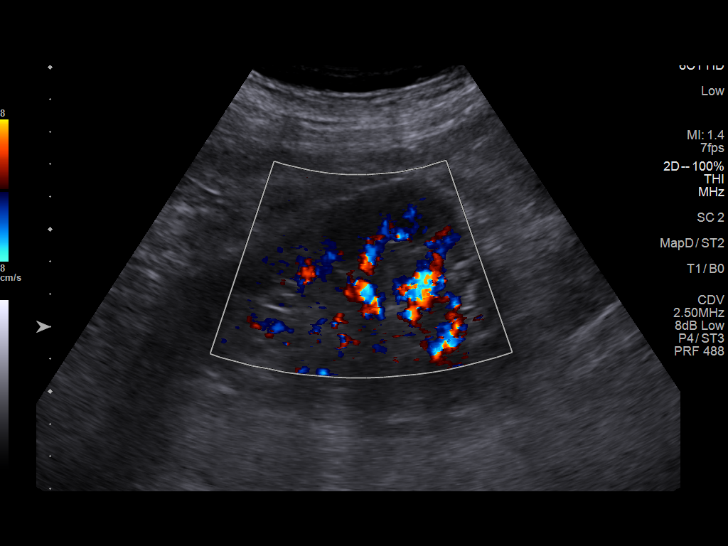
[im 28/37]
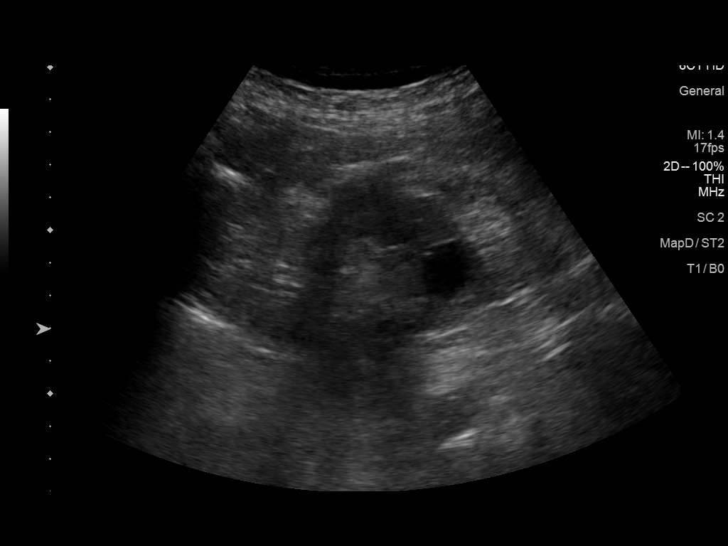
[im 31/37]
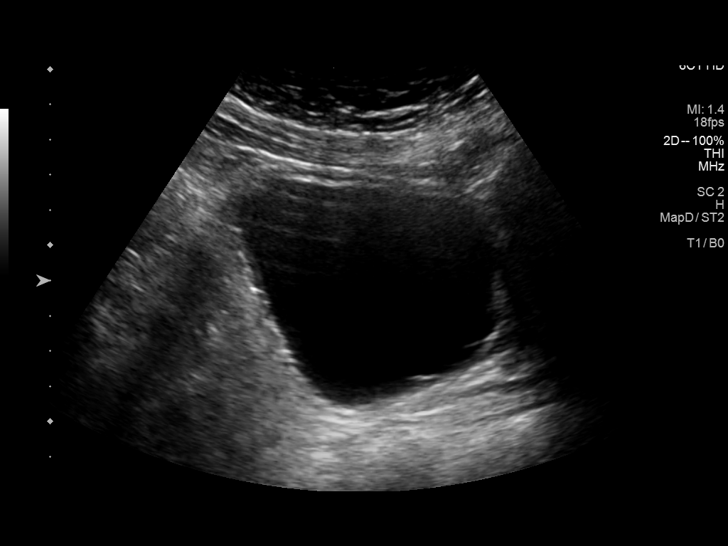
[im 34/37]
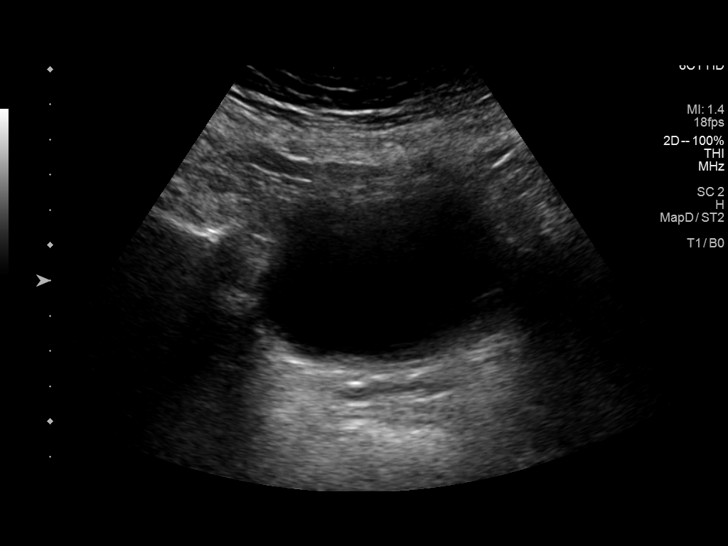
[im 37/37]
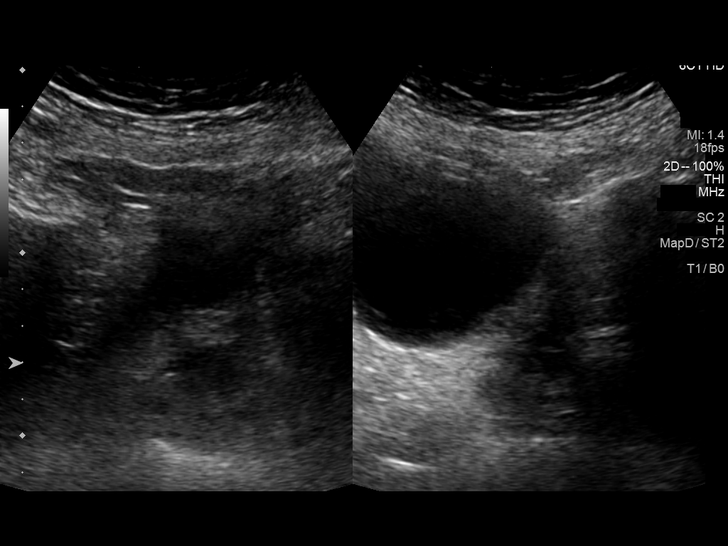

[14 of 25 positions shown; findings below may reference images not displayed]

FINDINGS: Right Kidney:

Renal measurements: 9.0 x 5.1 x 4.5 cm = volume: 103 mL. Increased
echogenicity within the renal parenchyma with diffuse cortical
thinning. No nephrolithiasis or hydronephrosis. 1.3 x 1.8 x 1.3 cm
simple cyst present at the upper pole.

Left Kidney:

Renal measurements: 11.6 x 5.7 x 6.0 cm = volume: 206 mL. Increased
echogenicity within the renal parenchyma with diffuse cortical
thinning. No nephrolithiasis or hydronephrosis. 2.0 x 2.0 x 1.5 cm
simple cyst present at the lower pole.

Bladder:

Appears normal for degree of bladder distention.

Other:

Prostate volume measures 28 mL.
IMPRESSION: 1. Increased echogenicity within the renal parenchyma with diffuse
cortical thinning, compatible with chronic medical renal disease.
2. No hydronephrosis or other acute abnormality.
3. Simple bilateral renal cysts as above.

## 2021-04-29 DIAGNOSIS — R110 Nausea: Secondary | ICD-10-CM | POA: Diagnosis not present

## 2021-04-29 DIAGNOSIS — N184 Chronic kidney disease, stage 4 (severe): Secondary | ICD-10-CM | POA: Diagnosis present

## 2021-04-29 DIAGNOSIS — I517 Cardiomegaly: Secondary | ICD-10-CM | POA: Diagnosis not present

## 2021-04-29 DIAGNOSIS — N179 Acute kidney failure, unspecified: Secondary | ICD-10-CM | POA: Diagnosis present

## 2021-04-29 DIAGNOSIS — E1122 Type 2 diabetes mellitus with diabetic chronic kidney disease: Secondary | ICD-10-CM | POA: Diagnosis present

## 2021-04-29 DIAGNOSIS — I4891 Unspecified atrial fibrillation: Secondary | ICD-10-CM | POA: Diagnosis not present

## 2021-04-29 DIAGNOSIS — R1110 Vomiting, unspecified: Secondary | ICD-10-CM | POA: Diagnosis not present

## 2021-04-29 DIAGNOSIS — Z888 Allergy status to other drugs, medicaments and biological substances status: Secondary | ICD-10-CM | POA: Diagnosis not present

## 2021-04-29 DIAGNOSIS — N2581 Secondary hyperparathyroidism of renal origin: Secondary | ICD-10-CM | POA: Diagnosis not present

## 2021-04-29 DIAGNOSIS — E871 Hypo-osmolality and hyponatremia: Secondary | ICD-10-CM | POA: Diagnosis not present

## 2021-04-29 DIAGNOSIS — E861 Hypovolemia: Secondary | ICD-10-CM | POA: Diagnosis not present

## 2021-04-29 DIAGNOSIS — I5189 Other ill-defined heart diseases: Secondary | ICD-10-CM | POA: Diagnosis not present

## 2021-04-29 DIAGNOSIS — I340 Nonrheumatic mitral (valve) insufficiency: Secondary | ICD-10-CM | POA: Diagnosis not present

## 2021-04-29 DIAGNOSIS — E7800 Pure hypercholesterolemia, unspecified: Secondary | ICD-10-CM | POA: Diagnosis present

## 2021-04-29 DIAGNOSIS — N183 Chronic kidney disease, stage 3 unspecified: Secondary | ICD-10-CM | POA: Diagnosis not present

## 2021-04-29 DIAGNOSIS — Z79899 Other long term (current) drug therapy: Secondary | ICD-10-CM | POA: Diagnosis not present

## 2021-04-29 DIAGNOSIS — Z7982 Long term (current) use of aspirin: Secondary | ICD-10-CM | POA: Diagnosis not present

## 2021-04-29 DIAGNOSIS — I129 Hypertensive chronic kidney disease with stage 1 through stage 4 chronic kidney disease, or unspecified chronic kidney disease: Secondary | ICD-10-CM | POA: Diagnosis present

## 2021-04-30 DIAGNOSIS — I340 Nonrheumatic mitral (valve) insufficiency: Secondary | ICD-10-CM

## 2021-05-06 DIAGNOSIS — Z6829 Body mass index (BMI) 29.0-29.9, adult: Secondary | ICD-10-CM | POA: Diagnosis not present

## 2021-05-06 DIAGNOSIS — R5381 Other malaise: Secondary | ICD-10-CM | POA: Diagnosis not present

## 2021-05-06 DIAGNOSIS — Z8679 Personal history of other diseases of the circulatory system: Secondary | ICD-10-CM | POA: Diagnosis not present

## 2021-05-06 DIAGNOSIS — I499 Cardiac arrhythmia, unspecified: Secondary | ICD-10-CM | POA: Diagnosis not present

## 2021-05-06 DIAGNOSIS — E871 Hypo-osmolality and hyponatremia: Secondary | ICD-10-CM | POA: Diagnosis not present

## 2021-05-07 DIAGNOSIS — N189 Chronic kidney disease, unspecified: Secondary | ICD-10-CM | POA: Diagnosis not present

## 2021-05-07 DIAGNOSIS — N184 Chronic kidney disease, stage 4 (severe): Secondary | ICD-10-CM | POA: Diagnosis not present

## 2021-05-07 DIAGNOSIS — N2581 Secondary hyperparathyroidism of renal origin: Secondary | ICD-10-CM | POA: Diagnosis not present

## 2021-05-08 DIAGNOSIS — E119 Type 2 diabetes mellitus without complications: Secondary | ICD-10-CM | POA: Diagnosis not present

## 2021-05-09 DIAGNOSIS — N2581 Secondary hyperparathyroidism of renal origin: Secondary | ICD-10-CM | POA: Diagnosis not present

## 2021-05-09 DIAGNOSIS — E1122 Type 2 diabetes mellitus with diabetic chronic kidney disease: Secondary | ICD-10-CM | POA: Diagnosis not present

## 2021-05-09 DIAGNOSIS — N183 Chronic kidney disease, stage 3 unspecified: Secondary | ICD-10-CM | POA: Diagnosis not present

## 2021-05-09 DIAGNOSIS — I129 Hypertensive chronic kidney disease with stage 1 through stage 4 chronic kidney disease, or unspecified chronic kidney disease: Secondary | ICD-10-CM | POA: Diagnosis not present

## 2021-05-09 DIAGNOSIS — I5189 Other ill-defined heart diseases: Secondary | ICD-10-CM | POA: Diagnosis not present

## 2021-05-15 DIAGNOSIS — E7800 Pure hypercholesterolemia, unspecified: Secondary | ICD-10-CM | POA: Diagnosis not present

## 2021-05-15 DIAGNOSIS — E1122 Type 2 diabetes mellitus with diabetic chronic kidney disease: Secondary | ICD-10-CM | POA: Diagnosis not present

## 2021-05-15 DIAGNOSIS — I129 Hypertensive chronic kidney disease with stage 1 through stage 4 chronic kidney disease, or unspecified chronic kidney disease: Secondary | ICD-10-CM | POA: Diagnosis not present

## 2021-05-15 DIAGNOSIS — N189 Chronic kidney disease, unspecified: Secondary | ICD-10-CM | POA: Diagnosis not present

## 2021-05-17 ENCOUNTER — Telehealth: Payer: Self-pay | Admitting: Cardiology

## 2021-05-17 DIAGNOSIS — D631 Anemia in chronic kidney disease: Secondary | ICD-10-CM | POA: Diagnosis not present

## 2021-05-17 DIAGNOSIS — N184 Chronic kidney disease, stage 4 (severe): Secondary | ICD-10-CM | POA: Diagnosis not present

## 2021-05-17 NOTE — Telephone Encounter (Signed)
Pt c/o BP issue: STAT if pt c/o blurred vision, one-sided weakness or slurred speech  1. What are your last 5 BP readings?  176-210/100's   2. Are you having any other symptoms (ex. Dizziness, headache, blurred vision, passed out)? Just feels bad with a headache   3. What is your BP issue? Hypertension. Just realized it today. Does have kidney disease that has worsened. Went and got a hemoglobin shot today. Requesting to be worked in for an appt next week. States he is also a close friend of Dr. Bettina Gavia.

## 2021-05-21 NOTE — Telephone Encounter (Signed)
Spoke to patient just now and let him know Dr. Joya Gaskins recommendations. He verbalizes understanding.

## 2021-05-21 NOTE — Telephone Encounter (Signed)
Spoke to the patient just now and he let me know that his blood pressure has been running high recently. He tells me that it has been "All over the place". He tells me that in the hospital last week it was 940-982 systolic.   Yesterday his blood pressure was 172/62 before taking his medications and 187/70 about an hour after taking his medications.   Right now his blood pressure is 197/75 but he has not taken his medications this morning.   He also is wanting to discuss his HGB being low due to chronic kidney disease (stage 4) and I advised that he needs to discuss this with his primary care provider. He also tells me that he had COVID back in January and was very sick with this.

## 2021-05-21 NOTE — Telephone Encounter (Signed)
Patient is returning call.  °

## 2021-05-21 NOTE — Telephone Encounter (Signed)
Left message on patients voicemail to please return our call.   

## 2021-05-24 DIAGNOSIS — N184 Chronic kidney disease, stage 4 (severe): Secondary | ICD-10-CM | POA: Diagnosis not present

## 2021-05-24 DIAGNOSIS — D631 Anemia in chronic kidney disease: Secondary | ICD-10-CM | POA: Diagnosis not present

## 2021-05-29 DIAGNOSIS — N189 Chronic kidney disease, unspecified: Secondary | ICD-10-CM | POA: Diagnosis not present

## 2021-05-29 DIAGNOSIS — D631 Anemia in chronic kidney disease: Secondary | ICD-10-CM | POA: Diagnosis not present

## 2021-05-31 DIAGNOSIS — N184 Chronic kidney disease, stage 4 (severe): Secondary | ICD-10-CM | POA: Diagnosis not present

## 2021-05-31 DIAGNOSIS — D631 Anemia in chronic kidney disease: Secondary | ICD-10-CM | POA: Diagnosis not present

## 2021-06-05 DIAGNOSIS — D631 Anemia in chronic kidney disease: Secondary | ICD-10-CM | POA: Diagnosis not present

## 2021-06-05 DIAGNOSIS — N189 Chronic kidney disease, unspecified: Secondary | ICD-10-CM | POA: Diagnosis not present

## 2021-06-06 DIAGNOSIS — N184 Chronic kidney disease, stage 4 (severe): Secondary | ICD-10-CM | POA: Diagnosis not present

## 2021-06-07 DIAGNOSIS — D631 Anemia in chronic kidney disease: Secondary | ICD-10-CM | POA: Diagnosis not present

## 2021-06-07 DIAGNOSIS — N184 Chronic kidney disease, stage 4 (severe): Secondary | ICD-10-CM | POA: Diagnosis not present

## 2021-06-10 DIAGNOSIS — Z85828 Personal history of other malignant neoplasm of skin: Secondary | ICD-10-CM | POA: Diagnosis not present

## 2021-06-10 DIAGNOSIS — N2581 Secondary hyperparathyroidism of renal origin: Secondary | ICD-10-CM | POA: Diagnosis not present

## 2021-06-10 DIAGNOSIS — I5189 Other ill-defined heart diseases: Secondary | ICD-10-CM | POA: Diagnosis not present

## 2021-06-10 DIAGNOSIS — L812 Freckles: Secondary | ICD-10-CM | POA: Diagnosis not present

## 2021-06-10 DIAGNOSIS — E1122 Type 2 diabetes mellitus with diabetic chronic kidney disease: Secondary | ICD-10-CM | POA: Diagnosis not present

## 2021-06-10 DIAGNOSIS — N184 Chronic kidney disease, stage 4 (severe): Secondary | ICD-10-CM | POA: Diagnosis not present

## 2021-06-10 DIAGNOSIS — L570 Actinic keratosis: Secondary | ICD-10-CM | POA: Diagnosis not present

## 2021-06-10 DIAGNOSIS — L821 Other seborrheic keratosis: Secondary | ICD-10-CM | POA: Diagnosis not present

## 2021-06-10 DIAGNOSIS — I129 Hypertensive chronic kidney disease with stage 1 through stage 4 chronic kidney disease, or unspecified chronic kidney disease: Secondary | ICD-10-CM | POA: Diagnosis not present

## 2021-06-13 DIAGNOSIS — Z23 Encounter for immunization: Secondary | ICD-10-CM | POA: Diagnosis not present

## 2021-06-13 DIAGNOSIS — Z6828 Body mass index (BMI) 28.0-28.9, adult: Secondary | ICD-10-CM | POA: Diagnosis not present

## 2021-06-13 DIAGNOSIS — D631 Anemia in chronic kidney disease: Secondary | ICD-10-CM | POA: Diagnosis not present

## 2021-06-13 DIAGNOSIS — G629 Polyneuropathy, unspecified: Secondary | ICD-10-CM | POA: Diagnosis not present

## 2021-06-13 DIAGNOSIS — N184 Chronic kidney disease, stage 4 (severe): Secondary | ICD-10-CM | POA: Diagnosis not present

## 2021-06-13 DIAGNOSIS — N189 Chronic kidney disease, unspecified: Secondary | ICD-10-CM | POA: Diagnosis not present

## 2021-06-13 DIAGNOSIS — I10 Essential (primary) hypertension: Secondary | ICD-10-CM | POA: Diagnosis not present

## 2021-06-14 DIAGNOSIS — N189 Chronic kidney disease, unspecified: Secondary | ICD-10-CM | POA: Diagnosis not present

## 2021-06-14 DIAGNOSIS — I10 Essential (primary) hypertension: Secondary | ICD-10-CM | POA: Diagnosis not present

## 2021-06-14 DIAGNOSIS — E7800 Pure hypercholesterolemia, unspecified: Secondary | ICD-10-CM | POA: Diagnosis not present

## 2021-06-20 DIAGNOSIS — N184 Chronic kidney disease, stage 4 (severe): Secondary | ICD-10-CM | POA: Diagnosis not present

## 2021-06-20 DIAGNOSIS — D631 Anemia in chronic kidney disease: Secondary | ICD-10-CM | POA: Diagnosis not present

## 2021-06-27 DIAGNOSIS — N184 Chronic kidney disease, stage 4 (severe): Secondary | ICD-10-CM | POA: Diagnosis not present

## 2021-06-27 DIAGNOSIS — D631 Anemia in chronic kidney disease: Secondary | ICD-10-CM | POA: Diagnosis not present

## 2021-06-28 ENCOUNTER — Encounter: Payer: Self-pay | Admitting: Cardiology

## 2021-07-03 DIAGNOSIS — D631 Anemia in chronic kidney disease: Secondary | ICD-10-CM | POA: Diagnosis not present

## 2021-07-03 DIAGNOSIS — N184 Chronic kidney disease, stage 4 (severe): Secondary | ICD-10-CM | POA: Diagnosis not present

## 2021-07-04 DIAGNOSIS — N184 Chronic kidney disease, stage 4 (severe): Secondary | ICD-10-CM | POA: Diagnosis not present

## 2021-07-07 NOTE — Progress Notes (Signed)
Cardiology Office Note:    Date:  07/08/2021   ID:  Jason Beltran, DOB June 03, 1941, MRN [id number]  PCP:  Jason Sheriff, MD  Cardiologist:  Jason More, MD    Referring MD: Jason Sheriff, MD    ASSESSMENT:    1. Hypertension, renal disease, stage 1-4 or unspecified chronic kidney disease   2. Stage 4 chronic kidney disease ([place])   3. Nonrheumatic aortic valve stenosis   4. Mixed hyperlipidemia    PLAN:    In order of problems listed above:  Overall he is stable he has stage IV CKD hypertension is controlled with combination of hydralazine amlodipine and is not having significant edema patent stent troponin times he has CKD anemia of CKD and hypertension.  Managed by Dr. Justin Beltran nephrology Aortic stenosis appears stable clinically recheck echocardiogram Continue with statin with aortic stenosis   Next appointment: 6 months   Medication Adjustments/Labs and Tests Ordered: Current medicines are reviewed at length with the patient today.  Concerns regarding medicines are outlined above.  No orders of the defined types were placed in this encounter.  No orders of the defined types were placed in this encounter.   Chief Complaint  Patient presents with   Follow-up   Aortic Stenosis   Hypertension   Leg Swelling     History of Present Illness:    Jason Beltran is a 80 y.o. male with a hx of mild aortic stenosis type 2 diabetes mellitus hypertension with CKD stage IV followed by nephrology and peripheral edema in the past with Actos therapy last seen 12/07/2020.  His last echocardiogram 01/16/2020 showed mild concentric LVH EF 60 to 65% and continued mild aortic stenosis peak and mean gradients of 27 and 12 mmHg Compliance with diet, lifestyle and medications: Yes  I was not aware of the orders [facility] admission with hyponatremia. Following this his blood pressure is elevated systolics 170 or greater amlodipine was initiated and since then predominantly  less than 017 systolic In the past he has had a great deal of problem lower extremity edema despite the calcium channel blocker he has very little edema he is also taking gabapentin that potentiates sodium retention He is not adding salt to his diet He has had no further falls No chest pain shortness of breath palpitation or syncope He is receiving erythropoietin and IV iron at [facility] for anemia with CKD  He was seen in his PCP office 05/06/2021.  Blood pressure visit 130/72 he had an EKG performed which showed sinus rhythm and 1 APC otherwise normal.  Impression was that he had stage IV CKD being followed by nephrology in [place] and hypertension blood pressure at target and his medications at that time consisted of low-dose aspirin carvedilol hydralazine and statin.  Past Medical History:  Diagnosis Date   Anemia of chronic disease    Aortic stenosis 05/08/2018   Very mild mean 9 mm Hg   Chronic sinusitis    CKD (chronic kidney disease)    Diabetes mellitus type 2, uncontrolled    Diastolic CHF ([place])    Diastolic dysfunction 4/94/4967   Essential hypertension 05/06/2018   Hyperparathyroidism ([place])    Hypertension    Neuropathy, peripheral    Obesity    Type 2 diabetes mellitus ([place]) 05/06/2018    Past Surgical History:  Procedure Laterality Date   ANKLE SURGERY Right 01/20/2012   NASAL SINUS SURGERY     VASECTOMY      Current Medications:  Current Meds  Medication Sig   ALOE VERA EX Apply topically.   ALOE VERA PO Take 200 mg by mouth daily.   amLODipine (NORVASC) 5 MG tablet Take 5 mg by mouth daily.   aspirin 81 MG EC tablet Take 81 mg by mouth daily. Swallow whole.   Biotin 5000 MCG TABS Take 1 tablet by mouth at bedtime.   carvedilol (COREG) 6.25 MG tablet Take 6.25 mg by mouth 2 (two) times daily with a meal.   Cholecalciferol (VITAMIN D3) 50 MCG (2000 UT) TABS Take 1 tablet by mouth daily.   Coenzyme Q10 (COQ10) 200 MG CAPS Take 1 capsule by mouth daily.    febuxostat (ULORIC) 40 MG tablet Take 20 mg by mouth daily.   gabapentin (NEURONTIN) 400 MG capsule Take 400 mg by mouth 2 (two) times daily.    Glucosamine-Chondroit-Vit C-Mn (GLUCOSAMINE 1500 COMPLEX) CAPS Take 1 capsule by mouth daily.   GRAPE SEED ER PO Take 1 capsule by mouth daily.   hydrALAZINE (APRESOLINE) 50 MG tablet Take 50 mg by mouth 3 (three) times daily.   ipratropium (ATROVENT) 0.06 % nasal spray Place 2 sprays into both nostrils daily as needed for rhinitis.   Multiple Vitamin (MULTIVITAMIN) tablet Take 1 tablet by mouth daily.   Saw Palmetto 450 MG CAPS Take 3 capsules by mouth daily.   simvastatin (ZOCOR) 20 MG tablet Take 20 mg by mouth daily.   Turmeric (QC TUMERIC COMPLEX PO) Take 1 tablet by mouth in the morning and at bedtime.   valACYclovir (VALTREX) 1000 MG tablet Take 1,000 mg by mouth 2 (two) times daily. For one day at onset     Allergies:   Actos [pioglitazone], Lisinopril, Motrin [ibuprofen], and Trilipix [choline fenofibrate]   Social History   Socioeconomic History   Marital status: Married    Spouse name: Not on file   Number of children: Not on file   Years of education: Not on file   Highest education level: Not on file  Occupational History   Not on file  Tobacco Use   Smoking status: Never   Smokeless tobacco: Never  Vaping Use   Vaping Use: Never used  Substance and Sexual Activity   Alcohol use: Yes    Comment: occasional glass of wine   Drug use: Never   Sexual activity: Not on file  Other Topics Concern   Not on file  Social History Narrative   Not on file   Social Determinants of Health   Financial Resource Strain: Not on file  Food Insecurity: Not on file  Transportation Needs: Not on file  Physical Activity: Not on file  Stress: Not on file  Social Connections: Not on file     Family History: The patient's family history includes Congestive Heart Failure in his mother; Heart failure in his father; Polymyalgia  rheumatica in his mother; Stroke in his paternal aunt and paternal grandmother. ROS:   Please see the history of present illness.    All other systems reviewed and are negative.  EKGs/Labs/Other Studies Reviewed:    The following studies were reviewed today:    Recent Labs: 11/12/2020 cholesterol 121 HDL 36 triglycerides 147 A1c 5.5% hemoglobin 11.4  creatinine 2.48 06/06/2021  Physical Exam:    VS:  BP (!) 142/58   Pulse (!) 58   Ht 5\' 8"  (1.727 m)   Wt 192 lb 9.6 oz (87.4 kg)   SpO2 96%   BMI 29.28 kg/m     Wt Readings  from Last 3 Encounters:  07/08/21 192 lb 9.6 oz (87.4 kg)  12/07/20 194 lb 3.2 oz (88.1 kg)  07/23/20 196 lb 3.2 oz (89 kg)     GEN: Appears his age well nourished, well developed in no acute distress HEENT: Normal NECK: No JVD; No carotid bruits LYMPHATICS: No lymphadenopathy CARDIAC: 2/6 systolic ejection murmur aortic area up to the clavicle does not radiate to the carotids S2 is normal and no aortic regurgitation RRR, no rubs, gallops RESPIRATORY:  Clear to auscultation without rales, wheezing or rhonchi  ABDOMEN: Soft, non-tender, non-distended MUSCULOSKELETAL: Bilateral lower extremity 1+ edema; No deformity  SKIN: Warm and dry NEUROLOGIC:  Alert and oriented x 3 PSYCHIATRIC:  Normal affect    Signed, Jason More, MD  07/08/2021 11:50 AM    [place]

## 2021-07-08 ENCOUNTER — Encounter: Payer: Self-pay | Admitting: Cardiology

## 2021-07-08 ENCOUNTER — Other Ambulatory Visit: Payer: Self-pay

## 2021-07-08 ENCOUNTER — Ambulatory Visit (INDEPENDENT_AMBULATORY_CARE_PROVIDER_SITE_OTHER): Payer: Medicare Other | Admitting: Cardiology

## 2021-07-08 VITALS — BP 142/58 | HR 58 | Ht 68.0 in | Wt 192.6 lb

## 2021-07-08 DIAGNOSIS — I129 Hypertensive chronic kidney disease with stage 1 through stage 4 chronic kidney disease, or unspecified chronic kidney disease: Secondary | ICD-10-CM

## 2021-07-08 DIAGNOSIS — N184 Chronic kidney disease, stage 4 (severe): Secondary | ICD-10-CM

## 2021-07-08 DIAGNOSIS — I350 Nonrheumatic aortic (valve) stenosis: Secondary | ICD-10-CM | POA: Diagnosis not present

## 2021-07-08 DIAGNOSIS — E782 Mixed hyperlipidemia: Secondary | ICD-10-CM

## 2021-07-08 NOTE — Addendum Note (Signed)
Addended by: Resa Miner I on: 07/08/2021 11:53 AM   Modules accepted: Orders

## 2021-07-08 NOTE — Patient Instructions (Signed)

## 2021-07-09 DIAGNOSIS — E1122 Type 2 diabetes mellitus with diabetic chronic kidney disease: Secondary | ICD-10-CM | POA: Diagnosis not present

## 2021-07-09 DIAGNOSIS — I129 Hypertensive chronic kidney disease with stage 1 through stage 4 chronic kidney disease, or unspecified chronic kidney disease: Secondary | ICD-10-CM | POA: Diagnosis not present

## 2021-07-09 DIAGNOSIS — N184 Chronic kidney disease, stage 4 (severe): Secondary | ICD-10-CM | POA: Diagnosis not present

## 2021-07-09 DIAGNOSIS — N2581 Secondary hyperparathyroidism of renal origin: Secondary | ICD-10-CM | POA: Diagnosis not present

## 2021-07-09 DIAGNOSIS — I5189 Other ill-defined heart diseases: Secondary | ICD-10-CM | POA: Diagnosis not present

## 2021-07-09 DIAGNOSIS — D631 Anemia in chronic kidney disease: Secondary | ICD-10-CM | POA: Diagnosis not present

## 2021-07-16 DIAGNOSIS — D631 Anemia in chronic kidney disease: Secondary | ICD-10-CM | POA: Diagnosis not present

## 2021-07-16 DIAGNOSIS — N184 Chronic kidney disease, stage 4 (severe): Secondary | ICD-10-CM | POA: Diagnosis not present

## 2021-07-23 DIAGNOSIS — N184 Chronic kidney disease, stage 4 (severe): Secondary | ICD-10-CM | POA: Diagnosis not present

## 2021-07-23 DIAGNOSIS — E7439 Other disorders of intestinal carbohydrate absorption: Secondary | ICD-10-CM | POA: Diagnosis not present

## 2021-07-23 DIAGNOSIS — I10 Essential (primary) hypertension: Secondary | ICD-10-CM | POA: Diagnosis not present

## 2021-07-23 DIAGNOSIS — E1165 Type 2 diabetes mellitus with hyperglycemia: Secondary | ICD-10-CM | POA: Diagnosis not present

## 2021-07-23 DIAGNOSIS — G629 Polyneuropathy, unspecified: Secondary | ICD-10-CM | POA: Diagnosis not present

## 2021-07-23 DIAGNOSIS — Z6829 Body mass index (BMI) 29.0-29.9, adult: Secondary | ICD-10-CM | POA: Diagnosis not present

## 2021-07-23 DIAGNOSIS — D631 Anemia in chronic kidney disease: Secondary | ICD-10-CM | POA: Diagnosis not present

## 2021-07-23 DIAGNOSIS — J301 Allergic rhinitis due to pollen: Secondary | ICD-10-CM | POA: Diagnosis not present

## 2021-07-24 ENCOUNTER — Ambulatory Visit (INDEPENDENT_AMBULATORY_CARE_PROVIDER_SITE_OTHER): Payer: Medicare Other

## 2021-07-24 ENCOUNTER — Other Ambulatory Visit: Payer: Self-pay

## 2021-07-24 ENCOUNTER — Telehealth: Payer: Self-pay

## 2021-07-24 DIAGNOSIS — I350 Nonrheumatic aortic (valve) stenosis: Secondary | ICD-10-CM

## 2021-07-24 LAB — ECHOCARDIOGRAM COMPLETE
AR max vel: 1.28 cm2
AV Area VTI: 1.18 cm2
AV Area mean vel: 1.38 cm2
AV Mean grad: 9.7 mmHg
AV Peak grad: 17.8 mmHg
Ao pk vel: 2.11 m/s
Area-P 1/2: 3.95 cm2
Calc EF: 48.7 %
S' Lateral: 3.7 cm
Single Plane A2C EF: 50.9 %
Single Plane A4C EF: 44 %

## 2021-07-24 NOTE — Telephone Encounter (Signed)
Spoke with patient regarding results and recommendation.  Patient verbalizes understanding and is agreeable to plan of care. Advised patient to call back with any issues or concerns.  

## 2021-07-24 NOTE — Telephone Encounter (Signed)
-----   Message from Richardo Priest, MD sent at 07/24/2021  2:34 PM EST ----- Stable result blockage of stenosis the aortic valve has not progressed heart muscle function ejection fraction remains normal.

## 2021-07-30 DIAGNOSIS — N184 Chronic kidney disease, stage 4 (severe): Secondary | ICD-10-CM | POA: Diagnosis not present

## 2021-07-30 DIAGNOSIS — D631 Anemia in chronic kidney disease: Secondary | ICD-10-CM | POA: Diagnosis not present

## 2021-08-07 DIAGNOSIS — N184 Chronic kidney disease, stage 4 (severe): Secondary | ICD-10-CM | POA: Diagnosis not present

## 2021-08-07 DIAGNOSIS — D631 Anemia in chronic kidney disease: Secondary | ICD-10-CM | POA: Diagnosis not present

## 2021-08-13 DIAGNOSIS — N184 Chronic kidney disease, stage 4 (severe): Secondary | ICD-10-CM | POA: Diagnosis not present

## 2021-08-13 DIAGNOSIS — D631 Anemia in chronic kidney disease: Secondary | ICD-10-CM | POA: Diagnosis not present

## 2021-08-14 DIAGNOSIS — N189 Chronic kidney disease, unspecified: Secondary | ICD-10-CM | POA: Diagnosis not present

## 2021-08-14 DIAGNOSIS — N184 Chronic kidney disease, stage 4 (severe): Secondary | ICD-10-CM | POA: Diagnosis not present

## 2021-08-14 DIAGNOSIS — E7800 Pure hypercholesterolemia, unspecified: Secondary | ICD-10-CM | POA: Diagnosis not present

## 2021-08-14 DIAGNOSIS — I10 Essential (primary) hypertension: Secondary | ICD-10-CM | POA: Diagnosis not present

## 2021-08-22 DIAGNOSIS — I5189 Other ill-defined heart diseases: Secondary | ICD-10-CM | POA: Diagnosis not present

## 2021-08-22 DIAGNOSIS — I129 Hypertensive chronic kidney disease with stage 1 through stage 4 chronic kidney disease, or unspecified chronic kidney disease: Secondary | ICD-10-CM | POA: Diagnosis not present

## 2021-08-22 DIAGNOSIS — N2581 Secondary hyperparathyroidism of renal origin: Secondary | ICD-10-CM | POA: Diagnosis not present

## 2021-08-22 DIAGNOSIS — N184 Chronic kidney disease, stage 4 (severe): Secondary | ICD-10-CM | POA: Diagnosis not present

## 2021-08-22 DIAGNOSIS — E1122 Type 2 diabetes mellitus with diabetic chronic kidney disease: Secondary | ICD-10-CM | POA: Diagnosis not present

## 2021-08-22 DIAGNOSIS — D631 Anemia in chronic kidney disease: Secondary | ICD-10-CM | POA: Diagnosis not present

## 2021-08-28 DIAGNOSIS — N184 Chronic kidney disease, stage 4 (severe): Secondary | ICD-10-CM | POA: Diagnosis not present

## 2021-08-28 DIAGNOSIS — D631 Anemia in chronic kidney disease: Secondary | ICD-10-CM | POA: Diagnosis not present

## 2021-09-04 DIAGNOSIS — N184 Chronic kidney disease, stage 4 (severe): Secondary | ICD-10-CM | POA: Diagnosis not present

## 2021-09-04 DIAGNOSIS — D631 Anemia in chronic kidney disease: Secondary | ICD-10-CM | POA: Diagnosis not present

## 2021-09-10 DIAGNOSIS — D631 Anemia in chronic kidney disease: Secondary | ICD-10-CM | POA: Diagnosis not present

## 2021-09-10 DIAGNOSIS — N184 Chronic kidney disease, stage 4 (severe): Secondary | ICD-10-CM | POA: Diagnosis not present

## 2021-09-17 DIAGNOSIS — D631 Anemia in chronic kidney disease: Secondary | ICD-10-CM | POA: Diagnosis not present

## 2021-09-17 DIAGNOSIS — N184 Chronic kidney disease, stage 4 (severe): Secondary | ICD-10-CM | POA: Diagnosis not present

## 2021-09-24 DIAGNOSIS — N184 Chronic kidney disease, stage 4 (severe): Secondary | ICD-10-CM | POA: Diagnosis not present

## 2021-09-24 DIAGNOSIS — D631 Anemia in chronic kidney disease: Secondary | ICD-10-CM | POA: Diagnosis not present

## 2021-10-01 DIAGNOSIS — N184 Chronic kidney disease, stage 4 (severe): Secondary | ICD-10-CM | POA: Diagnosis not present

## 2021-10-02 DIAGNOSIS — N184 Chronic kidney disease, stage 4 (severe): Secondary | ICD-10-CM | POA: Diagnosis not present

## 2021-10-02 DIAGNOSIS — D631 Anemia in chronic kidney disease: Secondary | ICD-10-CM | POA: Diagnosis not present

## 2021-10-03 DIAGNOSIS — N2581 Secondary hyperparathyroidism of renal origin: Secondary | ICD-10-CM | POA: Diagnosis not present

## 2021-10-03 DIAGNOSIS — N184 Chronic kidney disease, stage 4 (severe): Secondary | ICD-10-CM | POA: Diagnosis not present

## 2021-10-03 DIAGNOSIS — I5189 Other ill-defined heart diseases: Secondary | ICD-10-CM | POA: Diagnosis not present

## 2021-10-03 DIAGNOSIS — E1122 Type 2 diabetes mellitus with diabetic chronic kidney disease: Secondary | ICD-10-CM | POA: Diagnosis not present

## 2021-10-03 DIAGNOSIS — I129 Hypertensive chronic kidney disease with stage 1 through stage 4 chronic kidney disease, or unspecified chronic kidney disease: Secondary | ICD-10-CM | POA: Diagnosis not present

## 2021-10-10 DIAGNOSIS — N184 Chronic kidney disease, stage 4 (severe): Secondary | ICD-10-CM | POA: Diagnosis not present

## 2021-10-10 DIAGNOSIS — D631 Anemia in chronic kidney disease: Secondary | ICD-10-CM | POA: Diagnosis not present

## 2021-10-17 DIAGNOSIS — D631 Anemia in chronic kidney disease: Secondary | ICD-10-CM | POA: Diagnosis not present

## 2021-10-17 DIAGNOSIS — N184 Chronic kidney disease, stage 4 (severe): Secondary | ICD-10-CM | POA: Diagnosis not present

## 2021-10-25 DIAGNOSIS — D631 Anemia in chronic kidney disease: Secondary | ICD-10-CM | POA: Diagnosis not present

## 2021-10-25 DIAGNOSIS — N184 Chronic kidney disease, stage 4 (severe): Secondary | ICD-10-CM | POA: Diagnosis not present

## 2021-10-31 DIAGNOSIS — D631 Anemia in chronic kidney disease: Secondary | ICD-10-CM | POA: Diagnosis not present

## 2021-10-31 DIAGNOSIS — N184 Chronic kidney disease, stage 4 (severe): Secondary | ICD-10-CM | POA: Diagnosis not present

## 2021-11-05 DIAGNOSIS — N184 Chronic kidney disease, stage 4 (severe): Secondary | ICD-10-CM | POA: Diagnosis not present

## 2021-11-06 DIAGNOSIS — N184 Chronic kidney disease, stage 4 (severe): Secondary | ICD-10-CM | POA: Diagnosis not present

## 2021-11-06 DIAGNOSIS — D631 Anemia in chronic kidney disease: Secondary | ICD-10-CM | POA: Diagnosis not present

## 2021-11-07 DIAGNOSIS — E1122 Type 2 diabetes mellitus with diabetic chronic kidney disease: Secondary | ICD-10-CM | POA: Diagnosis not present

## 2021-11-07 DIAGNOSIS — N184 Chronic kidney disease, stage 4 (severe): Secondary | ICD-10-CM | POA: Diagnosis not present

## 2021-11-07 DIAGNOSIS — I5189 Other ill-defined heart diseases: Secondary | ICD-10-CM | POA: Diagnosis not present

## 2021-11-07 DIAGNOSIS — I129 Hypertensive chronic kidney disease with stage 1 through stage 4 chronic kidney disease, or unspecified chronic kidney disease: Secondary | ICD-10-CM | POA: Diagnosis not present

## 2021-11-07 DIAGNOSIS — N2581 Secondary hyperparathyroidism of renal origin: Secondary | ICD-10-CM | POA: Diagnosis not present

## 2021-11-12 DIAGNOSIS — N189 Chronic kidney disease, unspecified: Secondary | ICD-10-CM | POA: Diagnosis not present

## 2021-11-12 DIAGNOSIS — I10 Essential (primary) hypertension: Secondary | ICD-10-CM | POA: Diagnosis not present

## 2021-11-12 DIAGNOSIS — E7800 Pure hypercholesterolemia, unspecified: Secondary | ICD-10-CM | POA: Diagnosis not present

## 2021-11-13 DIAGNOSIS — N184 Chronic kidney disease, stage 4 (severe): Secondary | ICD-10-CM | POA: Diagnosis not present

## 2021-11-13 DIAGNOSIS — D631 Anemia in chronic kidney disease: Secondary | ICD-10-CM | POA: Diagnosis not present

## 2021-11-20 DIAGNOSIS — N184 Chronic kidney disease, stage 4 (severe): Secondary | ICD-10-CM | POA: Diagnosis not present

## 2021-11-20 DIAGNOSIS — D631 Anemia in chronic kidney disease: Secondary | ICD-10-CM | POA: Diagnosis not present

## 2021-11-26 DIAGNOSIS — N184 Chronic kidney disease, stage 4 (severe): Secondary | ICD-10-CM | POA: Diagnosis not present

## 2021-11-27 DIAGNOSIS — N184 Chronic kidney disease, stage 4 (severe): Secondary | ICD-10-CM | POA: Diagnosis not present

## 2021-11-27 DIAGNOSIS — D631 Anemia in chronic kidney disease: Secondary | ICD-10-CM | POA: Diagnosis not present

## 2021-12-03 DIAGNOSIS — E1122 Type 2 diabetes mellitus with diabetic chronic kidney disease: Secondary | ICD-10-CM | POA: Diagnosis not present

## 2021-12-03 DIAGNOSIS — N184 Chronic kidney disease, stage 4 (severe): Secondary | ICD-10-CM | POA: Diagnosis not present

## 2021-12-03 DIAGNOSIS — N2581 Secondary hyperparathyroidism of renal origin: Secondary | ICD-10-CM | POA: Diagnosis not present

## 2021-12-03 DIAGNOSIS — D631 Anemia in chronic kidney disease: Secondary | ICD-10-CM | POA: Diagnosis not present

## 2021-12-03 DIAGNOSIS — I129 Hypertensive chronic kidney disease with stage 1 through stage 4 chronic kidney disease, or unspecified chronic kidney disease: Secondary | ICD-10-CM | POA: Diagnosis not present

## 2021-12-03 DIAGNOSIS — I5189 Other ill-defined heart diseases: Secondary | ICD-10-CM | POA: Diagnosis not present

## 2021-12-09 DIAGNOSIS — Z85828 Personal history of other malignant neoplasm of skin: Secondary | ICD-10-CM | POA: Diagnosis not present

## 2021-12-09 DIAGNOSIS — L570 Actinic keratosis: Secondary | ICD-10-CM | POA: Diagnosis not present

## 2021-12-09 DIAGNOSIS — L821 Other seborrheic keratosis: Secondary | ICD-10-CM | POA: Diagnosis not present

## 2021-12-09 DIAGNOSIS — L723 Sebaceous cyst: Secondary | ICD-10-CM | POA: Diagnosis not present

## 2021-12-10 DIAGNOSIS — D631 Anemia in chronic kidney disease: Secondary | ICD-10-CM | POA: Diagnosis not present

## 2021-12-10 DIAGNOSIS — N184 Chronic kidney disease, stage 4 (severe): Secondary | ICD-10-CM | POA: Diagnosis not present

## 2021-12-16 DIAGNOSIS — E7800 Pure hypercholesterolemia, unspecified: Secondary | ICD-10-CM | POA: Diagnosis not present

## 2021-12-16 DIAGNOSIS — G629 Polyneuropathy, unspecified: Secondary | ICD-10-CM | POA: Diagnosis not present

## 2021-12-16 DIAGNOSIS — Z79899 Other long term (current) drug therapy: Secondary | ICD-10-CM | POA: Diagnosis not present

## 2021-12-16 DIAGNOSIS — N189 Chronic kidney disease, unspecified: Secondary | ICD-10-CM | POA: Diagnosis not present

## 2021-12-16 DIAGNOSIS — Z6829 Body mass index (BMI) 29.0-29.9, adult: Secondary | ICD-10-CM | POA: Diagnosis not present

## 2021-12-16 DIAGNOSIS — J301 Allergic rhinitis due to pollen: Secondary | ICD-10-CM | POA: Diagnosis not present

## 2021-12-16 DIAGNOSIS — Z0000 Encounter for general adult medical examination without abnormal findings: Secondary | ICD-10-CM | POA: Diagnosis not present

## 2021-12-18 DIAGNOSIS — D631 Anemia in chronic kidney disease: Secondary | ICD-10-CM | POA: Diagnosis not present

## 2021-12-18 DIAGNOSIS — N184 Chronic kidney disease, stage 4 (severe): Secondary | ICD-10-CM | POA: Diagnosis not present

## 2021-12-26 ENCOUNTER — Other Ambulatory Visit: Payer: Self-pay

## 2021-12-26 NOTE — Patient Outreach (Signed)
[facility]) Care Management ? ?12/26/2021 ? ?[name] ?1941/02/23 ?[id number] ? ? ?Emmi call for patient, compeleted engagement tool with patient and sent to [facility] for follow up as pcp is an embedded practice. ? ?Philmore Pali ?[place] Management Assistant ?[phone] ? ?

## 2021-12-27 DIAGNOSIS — N184 Chronic kidney disease, stage 4 (severe): Secondary | ICD-10-CM | POA: Diagnosis not present

## 2021-12-27 DIAGNOSIS — D631 Anemia in chronic kidney disease: Secondary | ICD-10-CM | POA: Diagnosis not present

## 2021-12-30 DIAGNOSIS — N184 Chronic kidney disease, stage 4 (severe): Secondary | ICD-10-CM | POA: Diagnosis not present

## 2021-12-31 DIAGNOSIS — R399 Unspecified symptoms and signs involving the genitourinary system: Secondary | ICD-10-CM | POA: Diagnosis not present

## 2021-12-31 DIAGNOSIS — N184 Chronic kidney disease, stage 4 (severe): Secondary | ICD-10-CM | POA: Diagnosis not present

## 2021-12-31 DIAGNOSIS — D631 Anemia in chronic kidney disease: Secondary | ICD-10-CM | POA: Diagnosis not present

## 2021-12-31 DIAGNOSIS — I129 Hypertensive chronic kidney disease with stage 1 through stage 4 chronic kidney disease, or unspecified chronic kidney disease: Secondary | ICD-10-CM | POA: Diagnosis not present

## 2021-12-31 DIAGNOSIS — E1122 Type 2 diabetes mellitus with diabetic chronic kidney disease: Secondary | ICD-10-CM | POA: Diagnosis not present

## 2021-12-31 DIAGNOSIS — N2581 Secondary hyperparathyroidism of renal origin: Secondary | ICD-10-CM | POA: Diagnosis not present

## 2021-12-31 DIAGNOSIS — E872 Acidosis, unspecified: Secondary | ICD-10-CM | POA: Diagnosis not present

## 2021-12-31 DIAGNOSIS — E785 Hyperlipidemia, unspecified: Secondary | ICD-10-CM | POA: Diagnosis not present

## 2022-01-03 DIAGNOSIS — N179 Acute kidney failure, unspecified: Secondary | ICD-10-CM

## 2022-01-03 DIAGNOSIS — R112 Nausea with vomiting, unspecified: Secondary | ICD-10-CM | POA: Insufficient documentation

## 2022-01-03 DIAGNOSIS — E871 Hypo-osmolality and hyponatremia: Secondary | ICD-10-CM

## 2022-01-03 DIAGNOSIS — R9431 Abnormal electrocardiogram [ECG] [EKG]: Secondary | ICD-10-CM

## 2022-01-03 DIAGNOSIS — R0602 Shortness of breath: Secondary | ICD-10-CM | POA: Insufficient documentation

## 2022-01-03 DIAGNOSIS — I4891 Unspecified atrial fibrillation: Secondary | ICD-10-CM | POA: Insufficient documentation

## 2022-01-03 DIAGNOSIS — K219 Gastro-esophageal reflux disease without esophagitis: Secondary | ICD-10-CM | POA: Insufficient documentation

## 2022-01-03 HISTORY — DX: Hypo-osmolality and hyponatremia: E87.1

## 2022-01-03 HISTORY — DX: Shortness of breath: R06.02

## 2022-01-03 HISTORY — DX: Acute kidney failure, unspecified: N17.9

## 2022-01-03 HISTORY — DX: Unspecified atrial fibrillation: I48.91

## 2022-01-03 HISTORY — DX: Abnormal electrocardiogram (ECG) (EKG): R94.31

## 2022-01-03 HISTORY — DX: Nausea with vomiting, unspecified: R11.2

## 2022-01-03 HISTORY — DX: Gastro-esophageal reflux disease without esophagitis: K21.9

## 2022-01-06 NOTE — Progress Notes (Signed)
?Cardiology Office Note:   ? ?Date:  01/07/2022  ? ?ID:  Jason Beltran, DOB 1941-07-01, MRN [id number] ? ?PCP:  Angelina Sheriff, MD  ?Cardiologist:  Shirlee More, MD   ? ?Referring MD: Angelina Sheriff, MD  ? ? ?ASSESSMENT:   ? ?1. Hypertension, renal disease, stage 1-4 or unspecified chronic kidney disease   ?2. Nonrheumatic aortic valve stenosis   ?3. Acute diastolic congestive heart failure ([place])   ?4. Chest pain of uncertain etiology   ? ?PLAN:   ? ?In order of problems listed above: ? ?Presents to my office with obvious decompensated heart failure in the setting of severe kidney disease.  I am unsure if this accounts for all of his symptoms but clearly will need inpatient care and will need to be supervised by nephrology do not think there is any opportunity to diurese him as an outpatient safely and at times this is a trigger to initiate renal replacement treatment. ?chest pain is possible angina EKG is normal he will need evaluation with high-sensitivity troponin likely a delta does not appear to have pericarditis with his kidney disease ultimately I think an echocardiogram would be helpful to redefine his ejection fraction normal on echocardiogram in December obviously he does not have severe aortic stenosis ? ? ?Next appointment: Determine after evaluation ? ? ?Medication Adjustments/Labs and Tests Ordered: ?Current medicines are reviewed at length with the patient today.  Concerns regarding medicines are outlined above.  ?No orders of the defined types were placed in this encounter. ? ?No orders of the defined types were placed in this encounter. ? ? ?Chief Complaint  ?Patient presents with  ? Chest Pain  ? Shortness of Breath  ?  Ongoing for 2 weeks   ? ? ?History of Present Illness:   ? ?Jason Beltran is a 81 y.o. male with a hx of mild aortic stenosis hypertension with CKD stage IV followed by oncology and type 2 diabetes last seen 07/08/2021. ? ?Compliance with diet, lifestyle and medications:  Yes ? ?This was a routine visit quickly pivoted to a sick visit ?He tells me about 1 to 2 weeks.  Seen by nephrology GFR 90 cc and there is discussion about placing a fistula to prepare for hemodialysis ?Is gained a significant amount of weight he has a great deal of edema mildly short of breath at rest and does not feel well ?For last 2 weeks he is intermittently having chest pain he describes as a pressure in the chest and has been worsening last 24 hours and still vaguely there is never resolved ?Is not pleuritic not exertional in nature. ?He feels quite weak no fever chills or cough ?His EKG in my office shows sinus rhythm 57 bpm first-degree AV block he has no ischemic ST-T changes or pattern of infarction ?Physical examination shows obvious decompensated heart failure greater than 4+ edema to the thighs presacral edema moderate neck vein distention and likely a right pleural effusion exam and a third heart sound ?At a minimum he has acute decompensated heart failure associated with his severe kidney disease ?I am unsure if his chest pain is indicative of acute coronary syndrome he will need high-sensitivity troponin unlikely and delta but I be hesitant to do urgent catheterization.  It does not seem to be pericardial or pleural in nature. ?I think he is best served by going to the emergency room med [facility] for evaluation and coordination with hospitalist nephrologist cc Dr. Myrene Buddy and  cardiologist in a consultative role ?His wife feels comfortable driving there ? ?An echocardiogram performed 07/24/2021.  Aortic valve is mildly calcified trileaflet mildly thickened he had no regurgitation and mild aortic stenosis seen with a mean gradient of 10 mmHg valve area 1.2 cm?.  Right ventricle is normal in size mild concentric LVH grade 2 diastolic dysfunction right ventricle normal size function with mildly elevated pulmonary artery systolic pressure mild mitral regurgitation was seen. ? 1. Left  ventricular ejection fraction, by estimation, is 55 to 60%. The  ?left ventricle has normal function. The left ventricle has no regional  ?wall motion abnormalities. There is mild left ventricular hypertrophy.  ?Left ventricular diastolic parameters  ?are consistent with Grade II diastolic dysfunction (pseudonormalization).  ? 2. Right ventricular systolic function is normal. The right ventricular  ?size is normal. There is mildly elevated pulmonary artery systolic  ?pressure.  ? 3. Left atrial size was mildly dilated.  ? 4. The mitral valve is normal in structure. Mild mitral valve  ?regurgitation. No evidence of mitral stenosis.  ? 5. The aortic valve is normal in structure. There is moderate  ?calcification of the aortic valve. There is mild thickening of the aortic  ?valve. Aortic valve regurgitation is not visualized. Mild aortic valve  ?stenosis. Aortic valve area, by VTI measures  ?1.18 cm?Marland Kitchen Aortic valve mean gradient measures 9.7 mmHg.  ? 6. The inferior vena cava is normal in size with greater than 50%  ?respiratory variability, suggesting right atrial pressure of 3 mmHg.  ?Past Medical History:  ?Diagnosis Date  ? Anemia of chronic disease   ? Aortic stenosis 05/08/2018  ? Very mild mean 9 mm Hg  ? Chronic sinusitis   ? CKD (chronic kidney disease)   ? Diabetes mellitus type 2, uncontrolled   ? Diastolic CHF ([place])   ? Diastolic dysfunction 6/38/9373  ? Essential hypertension 05/06/2018  ? Hyperparathyroidism ([place])   ? Hypertension   ? Neuropathy, peripheral   ? Obesity   ? Type 2 diabetes mellitus ([place]) 05/06/2018  ? ? ?Past Surgical History:  ?Procedure Laterality Date  ? ANKLE SURGERY Right 01/20/2012  ? NASAL SINUS SURGERY    ? VASECTOMY    ? ? ?Current Medications: ?Current Meds  ?Medication Sig  ? ALOE VERA EX Apply 1 application. topically daily.  ? ALOE VERA PO Take 200 mg by mouth daily.  ? amLODipine (NORVASC) 5 MG tablet Take 5 mg by mouth daily.  ? aspirin 81 MG EC tablet Take 81 mg by mouth daily.  Swallow whole.  ? Biotin 5000 MCG TABS Take 1 tablet by mouth at bedtime.  ? carvedilol (COREG) 6.25 MG tablet Take 6.25 mg by mouth 2 (two) times daily with a meal.  ? Cholecalciferol (VITAMIN D3) 50 MCG (2000 UT) TABS Take 1 tablet by mouth daily.  ? Coenzyme Q10 (COQ10) 200 MG CAPS Take 1 capsule by mouth daily.  ? febuxostat (ULORIC) 40 MG tablet Take 20 mg by mouth daily.  ? gabapentin (NEURONTIN) 400 MG capsule Take 400 mg by mouth 2 (two) times daily.   ? Glucosamine-Chondroit-Vit C-Mn (GLUCOSAMINE 1500 COMPLEX) CAPS Take 1 capsule by mouth daily.  ? GRAPE SEED ER PO Take 1 capsule by mouth daily.  ? hydrALAZINE (APRESOLINE) 50 MG tablet Take 50 mg by mouth 3 (three) times daily.  ? ipratropium (ATROVENT) 0.06 % nasal spray Place 2 sprays into both nostrils daily as needed for rhinitis.  ? Multiple Vitamin (MULTIVITAMIN) tablet Take 1 tablet by  mouth daily.  ? Saw Palmetto 450 MG CAPS Take 3 capsules by mouth daily.  ? simvastatin (ZOCOR) 20 MG tablet Take 20 mg by mouth daily.  ? Turmeric (QC TUMERIC COMPLEX PO) Take 1 tablet by mouth in the morning and at bedtime.  ? valACYclovir (VALTREX) 1000 MG tablet Take 1,000 mg by mouth 2 (two) times daily. For one day at onset  ?  ? ?Allergies:   Actos [pioglitazone], Lisinopril, Motrin [ibuprofen], and Trilipix [choline fenofibrate]  ? ?Social History  ? ?Socioeconomic History  ? Marital status: Married  ?  Spouse name: Not on file  ? Number of children: Not on file  ? Years of education: Not on file  ? Highest education level: Not on file  ?Occupational History  ? Not on file  ?Tobacco Use  ? Smoking status: Never  ? Smokeless tobacco: Never  ?Vaping Use  ? Vaping Use: Never used  ?Substance and Sexual Activity  ? Alcohol use: Yes  ?  Comment: occasional glass of wine  ? Drug use: Never  ? Sexual activity: Not on file  ?Other Topics Concern  ? Not on file  ?Social History Narrative  ? Not on file  ? ?Social Determinants of Health  ? ?Financial Resource Strain: Not  on file  ?Food Insecurity: Not on file  ?Transportation Needs: Not on file  ?Physical Activity: Not on file  ?Stress: Not on file  ?Social Connections: Not on file  ?  ? ?Family History: ?The patient's famil

## 2022-01-07 ENCOUNTER — Emergency Department (HOSPITAL_COMMUNITY): Payer: Medicare Other

## 2022-01-07 ENCOUNTER — Encounter (HOSPITAL_COMMUNITY): Payer: Self-pay | Admitting: Emergency Medicine

## 2022-01-07 ENCOUNTER — Inpatient Hospital Stay (HOSPITAL_COMMUNITY)
Admission: EM | Admit: 2022-01-07 | Discharge: 2022-01-10 | DRG: 291 | Disposition: A | Discharge status: Home / Self Care | Payer: Medicare Other | Attending: Internal Medicine | Admitting: Internal Medicine

## 2022-01-07 ENCOUNTER — Encounter: Payer: Self-pay | Admitting: Cardiology

## 2022-01-07 ENCOUNTER — Ambulatory Visit (INDEPENDENT_AMBULATORY_CARE_PROVIDER_SITE_OTHER): Payer: Medicare Other | Admitting: Cardiology

## 2022-01-07 ENCOUNTER — Other Ambulatory Visit: Payer: Self-pay

## 2022-01-07 VITALS — BP 160/60 | HR 58 | Ht 68.0 in | Wt 200.8 lb

## 2022-01-07 DIAGNOSIS — Z888 Allergy status to other drugs, medicaments and biological substances status: Secondary | ICD-10-CM

## 2022-01-07 DIAGNOSIS — E785 Hyperlipidemia, unspecified: Secondary | ICD-10-CM | POA: Diagnosis present

## 2022-01-07 DIAGNOSIS — D649 Anemia, unspecified: Secondary | ICD-10-CM | POA: Diagnosis not present

## 2022-01-07 DIAGNOSIS — N184 Chronic kidney disease, stage 4 (severe): Secondary | ICD-10-CM | POA: Diagnosis not present

## 2022-01-07 DIAGNOSIS — I5043 Acute on chronic combined systolic (congestive) and diastolic (congestive) heart failure: Secondary | ICD-10-CM | POA: Diagnosis present

## 2022-01-07 DIAGNOSIS — Z7982 Long term (current) use of aspirin: Secondary | ICD-10-CM

## 2022-01-07 DIAGNOSIS — I350 Nonrheumatic aortic (valve) stenosis: Secondary | ICD-10-CM

## 2022-01-07 DIAGNOSIS — I130 Hypertensive heart and chronic kidney disease with heart failure and stage 1 through stage 4 chronic kidney disease, or unspecified chronic kidney disease: Secondary | ICD-10-CM | POA: Diagnosis present

## 2022-01-07 DIAGNOSIS — I10 Essential (primary) hypertension: Secondary | ICD-10-CM | POA: Diagnosis present

## 2022-01-07 DIAGNOSIS — I5033 Acute on chronic diastolic (congestive) heart failure: Secondary | ICD-10-CM

## 2022-01-07 DIAGNOSIS — Z79899 Other long term (current) drug therapy: Secondary | ICD-10-CM | POA: Diagnosis not present

## 2022-01-07 DIAGNOSIS — Z8249 Family history of ischemic heart disease and other diseases of the circulatory system: Secondary | ICD-10-CM

## 2022-01-07 DIAGNOSIS — E1122 Type 2 diabetes mellitus with diabetic chronic kidney disease: Secondary | ICD-10-CM | POA: Diagnosis present

## 2022-01-07 DIAGNOSIS — I248 Other forms of acute ischemic heart disease: Secondary | ICD-10-CM | POA: Diagnosis present

## 2022-01-07 DIAGNOSIS — R079 Chest pain, unspecified: Secondary | ICD-10-CM | POA: Diagnosis not present

## 2022-01-07 DIAGNOSIS — N179 Acute kidney failure, unspecified: Secondary | ICD-10-CM | POA: Diagnosis not present

## 2022-01-07 DIAGNOSIS — E1142 Type 2 diabetes mellitus with diabetic polyneuropathy: Secondary | ICD-10-CM | POA: Diagnosis present

## 2022-01-07 DIAGNOSIS — R0600 Dyspnea, unspecified: Secondary | ICD-10-CM | POA: Diagnosis not present

## 2022-01-07 DIAGNOSIS — R778 Other specified abnormalities of plasma proteins: Secondary | ICD-10-CM | POA: Diagnosis not present

## 2022-01-07 DIAGNOSIS — Z886 Allergy status to analgesic agent status: Secondary | ICD-10-CM

## 2022-01-07 DIAGNOSIS — I110 Hypertensive heart disease with heart failure: Secondary | ICD-10-CM | POA: Diagnosis not present

## 2022-01-07 DIAGNOSIS — R609 Edema, unspecified: Secondary | ICD-10-CM | POA: Diagnosis not present

## 2022-01-07 DIAGNOSIS — I5031 Acute diastolic (congestive) heart failure: Secondary | ICD-10-CM | POA: Diagnosis not present

## 2022-01-07 DIAGNOSIS — J90 Pleural effusion, not elsewhere classified: Secondary | ICD-10-CM | POA: Diagnosis not present

## 2022-01-07 DIAGNOSIS — N185 Chronic kidney disease, stage 5: Secondary | ICD-10-CM | POA: Diagnosis present

## 2022-01-07 DIAGNOSIS — T501X5A Adverse effect of loop [high-ceiling] diuretics, initial encounter: Secondary | ICD-10-CM | POA: Diagnosis not present

## 2022-01-07 DIAGNOSIS — Z823 Family history of stroke: Secondary | ICD-10-CM | POA: Diagnosis not present

## 2022-01-07 DIAGNOSIS — I509 Heart failure, unspecified: Secondary | ICD-10-CM | POA: Diagnosis not present

## 2022-01-07 DIAGNOSIS — I129 Hypertensive chronic kidney disease with stage 1 through stage 4 chronic kidney disease, or unspecified chronic kidney disease: Secondary | ICD-10-CM

## 2022-01-07 DIAGNOSIS — R001 Bradycardia, unspecified: Secondary | ICD-10-CM | POA: Diagnosis not present

## 2022-01-07 DIAGNOSIS — N189 Chronic kidney disease, unspecified: Secondary | ICD-10-CM | POA: Diagnosis present

## 2022-01-07 DIAGNOSIS — R0789 Other chest pain: Secondary | ICD-10-CM | POA: Diagnosis not present

## 2022-01-07 HISTORY — DX: Heart failure, unspecified: I50.9

## 2022-01-07 LAB — CBC
HCT: 35.3 % — ABNORMAL LOW (ref 39.0–52.0)
Hemoglobin: 11.6 g/dL — ABNORMAL LOW (ref 13.0–17.0)
MCH: 30 pg (ref 26.0–34.0)
MCHC: 32.9 g/dL (ref 30.0–36.0)
MCV: 91.2 fL (ref 80.0–100.0)
Platelets: 131 10*3/uL — ABNORMAL LOW (ref 150–400)
RBC: 3.87 MIL/uL — ABNORMAL LOW (ref 4.22–5.81)
RDW: 14 % (ref 11.5–15.5)
WBC: 5.7 10*3/uL (ref 4.0–10.5)
nRBC: 0 % (ref 0.0–0.2)

## 2022-01-07 LAB — BASIC METABOLIC PANEL
Anion gap: 7 (ref 5–15)
BUN: 39 mg/dL — ABNORMAL HIGH (ref 8–23)
CO2: 24 mmol/L (ref 22–32)
Calcium: 9.2 mg/dL (ref 8.9–10.3)
Chloride: 108 mmol/L (ref 98–111)
Creatinine, Ser: 3.35 mg/dL — ABNORMAL HIGH (ref 0.61–1.24)
GFR, Estimated: 18 mL/min — ABNORMAL LOW (ref >60)
Glucose, Bld: 94 mg/dL (ref 70–99)
Potassium: 4.6 mmol/L (ref 3.5–5.1)
Sodium: 139 mmol/L (ref 135–145)

## 2022-01-07 LAB — BRAIN NATRIURETIC PEPTIDE: B Natriuretic Peptide: 1019.3 pg/mL — ABNORMAL HIGH (ref 0.0–100.0)

## 2022-01-07 LAB — TROPONIN I (HIGH SENSITIVITY)
Troponin I (High Sensitivity): 28 ng/L — ABNORMAL HIGH (ref <18)
Troponin I (High Sensitivity): 26 ng/L — ABNORMAL HIGH (ref <18)

## 2022-01-07 MED ORDER — NITROGLYCERIN 0.4 MG SL SUBL
0.4000 mg | SUBLINGUAL_TABLET | SUBLINGUAL | Status: DC | PRN
  Administered 2022-01-07 (×2): 0.4 mg via SUBLINGUAL
  Filled 2022-01-07: qty 1

## 2022-01-07 MED ORDER — FUROSEMIDE 10 MG/ML IJ SOLN
60.0000 mg | Freq: Once | INTRAMUSCULAR | Status: AC
  Administered 2022-01-07: 60 mg via INTRAVENOUS
  Filled 2022-01-07: qty 6

## 2022-01-07 MED ORDER — FUROSEMIDE 10 MG/ML IJ SOLN
40.0000 mg | Freq: Two times a day (BID) | INTRAMUSCULAR | Status: DC
  Administered 2022-01-08 (×2): 40 mg via INTRAVENOUS
  Filled 2022-01-07 (×3): qty 4

## 2022-01-07 MED ORDER — CARVEDILOL 6.25 MG PO TABS
6.2500 mg | ORAL_TABLET | Freq: Two times a day (BID) | ORAL | Status: DC
  Administered 2022-01-08 – 2022-01-10 (×5): 6.25 mg via ORAL
  Filled 2022-01-07 (×5): qty 1

## 2022-01-07 MED ORDER — ACETAMINOPHEN 325 MG PO TABS: 650.0000 mg | ORAL_TABLET | Freq: Four times a day (QID) | ORAL | Status: DC | PRN

## 2022-01-07 MED ORDER — HYDRALAZINE HCL 50 MG PO TABS
50.0000 mg | ORAL_TABLET | Freq: Three times a day (TID) | ORAL | Status: DC
  Administered 2022-01-07 – 2022-01-10 (×8): 50 mg via ORAL
  Filled 2022-01-07 (×8): qty 1

## 2022-01-07 MED ORDER — ACETAMINOPHEN 650 MG RE SUPP: 650.0000 mg | Freq: Four times a day (QID) | RECTAL | Status: DC | PRN

## 2022-01-07 MED ORDER — HEPARIN SODIUM (PORCINE) 5000 UNIT/ML IJ SOLN
5000.0000 [IU] | Freq: Three times a day (TID) | INTRAMUSCULAR | Status: DC
  Administered 2022-01-07 – 2022-01-10 (×8): 5000 [IU] via SUBCUTANEOUS
  Filled 2022-01-07 (×8): qty 1

## 2022-01-07 MED ORDER — ASPIRIN 325 MG PO TABS
325.0000 mg | ORAL_TABLET | Freq: Every day | ORAL | Status: DC
  Administered 2022-01-07 – 2022-01-09 (×3): 325 mg via ORAL
  Filled 2022-01-07 (×3): qty 1

## 2022-01-07 NOTE — H&P (Signed)
?History and Physical  ? ? ?Jason Beltran UXL:[id number] DOB: 1941/07/25 DOA: 01/07/2022 ? ?PCP: Angelina Sheriff, MD  ?Patient coming from: Home via cardiology office ? ?I have personally briefly reviewed patient's old medical records available.  ? ?Chief Complaint: Shortness of breath, leg swelling ? ?HPI: Jason Beltran is a 81 y.o. male with medical history significant of chronic diastolic heart failure, CKD stage IV, hypertension, diet-controlled diabetes, peripheral neuropathy with chronic leg swelling presenting to the emergency room with worsening shortness of breath, chest tightness associated with exertion worse for 2 weeks.  2 pound weight gain over the last 2 days. ?Denies any orthopnea or PND.  Patient can walk safely in the plane services.  He gets out of breath on walking 1 flight of stairs.  Denies any chest pain at rest.  Not on any diuretics. ?Denies any cough, congestion, cold.  Denies any dizziness or lightheadedness.  Appetite is fair.  Urination is normal. ?Patient went to cardiology clinic today and found to have significant fluid retention and sent to the ER. ?ED Course: Hemodynamically stable.  On room air.  2+ edema bilateral legs.  Chest x-ray with bilateral congestion.  Creatinine 3.3 which is at about his baseline.  Follows up with [facility].  Admission requested to due to significant symptoms.  Cardiology was notified. ? ?Review of Systems: all systems are reviewed and pertinent positive as per HPI otherwise rest are negative.  ? ? ?Past Medical History:  ?Diagnosis Date  ? Anemia of chronic disease   ? Aortic stenosis 05/08/2018  ? Very mild mean 9 mm Hg  ? Chronic sinusitis   ? CKD (chronic kidney disease)   ? Diabetes mellitus type 2, uncontrolled   ? Diastolic CHF ([place])   ? Diastolic dysfunction 5/36/6440  ? Essential hypertension 05/06/2018  ? Hyperparathyroidism ([place])   ? Hypertension   ? Neuropathy, peripheral   ? Obesity   ? Type 2 diabetes mellitus ([place]) 05/06/2018  ? ? ?Past  Surgical History:  ?Procedure Laterality Date  ? ANKLE SURGERY Right 01/20/2012  ? NASAL SINUS SURGERY    ? VASECTOMY    ? ? ?Social history  ? reports that he has never smoked. He has never used smokeless tobacco. He reports current alcohol use. He reports that he does not use drugs. ? ?Allergies  ?Allergen Reactions  ? Actos [Pioglitazone] Other (See Comments)  ?  Contraindicated by cardiomyopathy  ? Lisinopril Other (See Comments)  ?  Antihypertensives and ARB's discouraged by Nephrology  ? Motrin [Ibuprofen]   ?  Prefers not to take  ? Trilipix [Choline Fenofibrate] Other (See Comments)  ?  Contributed to elevated LFT's  ? ? ?Family History  ?Problem Relation Age of Onset  ? Polymyalgia rheumatica Mother   ? Congestive Heart Failure Mother   ? Heart failure Father   ? Stroke Paternal Grandmother   ? Stroke Paternal Aunt   ? ? ? ?Prior to Admission medications   ?Medication Sig Start Date End Date Taking? Authorizing Provider  ?ALOE VERA EX Apply 1 application. topically daily.   Yes [provider]  ?ALOE VERA PO Take 200 mg by mouth daily.   Yes [provider]  ?amLODipine (NORVASC) 5 MG tablet Take 5 mg by mouth daily. 06/13/21  Yes [provider]  ?aspirin 81 MG EC tablet Take 81 mg by mouth every evening.   Yes [provider]  ?Biotin 5000 MCG TABS Take 1 tablet by mouth at bedtime.  Yes [provider]  ?carvedilol (COREG) 6.25 MG tablet Take 6.25 mg by mouth 2 (two) times daily with a meal.   Yes [provider]  ?Cholecalciferol (VITAMIN D3) 50 MCG (2000 UT) TABS Take 1 tablet by mouth daily.   Yes [provider]  ?Coenzyme Q10 (COQ10) 200 MG CAPS Take 1 capsule by mouth every evening.   Yes [provider]  ?febuxostat (ULORIC) 40 MG tablet Take 20 mg by mouth daily.   Yes [provider]  ?gabapentin (NEURONTIN) 400 MG capsule Take 400 mg by mouth 2 (two) times daily.    Yes [provider]   ?Glucosamine-Chondroit-Vit C-Mn (GLUCOSAMINE 1500 COMPLEX) CAPS Take 1 capsule by mouth daily.   Yes [provider]  ?GRAPE SEED ER PO Take 1 capsule by mouth daily.   Yes [provider]  ?hydrALAZINE (APRESOLINE) 50 MG tablet Take 50 mg by mouth 3 (three) times daily.   Yes [provider]  ?ipratropium (ATROVENT) 0.06 % nasal spray Place 2 sprays into both nostrils daily as needed for rhinitis.   Yes [provider]  ?Multiple Vitamin (MULTIVITAMIN) tablet Take 1 tablet by mouth daily.   Yes [provider]  ?Saw Palmetto 450 MG CAPS Take 3 capsules by mouth daily.   Yes [provider]  ?simvastatin (ZOCOR) 20 MG tablet Take 20 mg by mouth every evening.   Yes [provider]  ?Turmeric (QC TUMERIC COMPLEX PO) Take 1 tablet by mouth in the morning and at bedtime.   Yes [provider]  ?valACYclovir (VALTREX) 1000 MG tablet Take 1,000 mg by mouth 2 (two) times daily. For one day at onset   Yes [provider]  ? ? ?Physical Exam: ?Vitals:  ? 01/07/22 1611 01/07/22 1630 01/07/22 1645 01/07/22 1700  ?BP: (!) 163/72 (!) 163/83 (!) 176/68 (!) 177/64  ?Pulse: 65 (!) 59 63 (!) 59  ?Resp: '15 16 20 '$ (!) 21  ?Temp:      ?TempSrc:      ?SpO2: 96% 94% 96% 95%  ?Weight:      ?Height:      ? ? ?Constitutional: NAD, calm, comfortable.  On room air. ?Vitals:  ? 01/07/22 1611 01/07/22 1630 01/07/22 1645 01/07/22 1700  ?BP: (!) 163/72 (!) 163/83 (!) 176/68 (!) 177/64  ?Pulse: 65 (!) 59 63 (!) 59  ?Resp: '15 16 20 '$ (!) 21  ?Temp:      ?TempSrc:      ?SpO2: 96% 94% 96% 95%  ?Weight:      ?Height:      ? ?Eyes: PERRL, lids and conjunctivae normal ?ENMT: Mucous membranes are moist. Posterior pharynx clear of any exudate or lesions.Normal dentition.  ?Neck: normal, supple, no masses, no thyromegaly ?Respiratory: clear to auscultation bilaterally, no wheezing, no crackles. Normal respiratory effort. No accessory muscle use.  No added  sounds. ?Cardiovascular: Regular rate and rhythm, no murmurs / rubs / gallops.  2+ pedal edema bilateral.  Mildly tender. 2+ pedal pulses. No carotid bruits.  ?Abdomen: no tenderness, no masses palpated. No hepatosplenomegaly. Bowel sounds positive.  ?Musculoskeletal: no clubbing / cyanosis. No joint deformity upper and lower extremities. Good ROM, no contractures. Normal muscle tone.  ?Skin: no rashes, lesions, ulcers. No induration ?Neurologic: CN 2-12 grossly intact. Sensation intact, DTR normal. Strength 5/5 in all 4.  ?Psychiatric: Normal judgment and insight. Alert and oriented x 3. Normal mood.  ? ? ? ?Labs on Admission: I have personally reviewed following labs and imaging  studies ? ?CBC: ?Recent Labs  ?Lab 01/07/22 ?1346  ?WBC 5.7  ?HGB 11.6*  ?HCT 35.3*  ?MCV 91.2  ?PLT 131*  ? ?Basic Metabolic Panel: ?Recent Labs  ?Lab 01/07/22 ?1346  ?NA 139  ?K 4.6  ?CL 108  ?CO2 24  ?GLUCOSE 94  ?BUN 39*  ?CREATININE 3.35*  ?CALCIUM 9.2  ? ?GFR: ?Estimated Creatinine Clearance: 18.3 mL/min (A) (by C-G formula based on SCr of 3.35 mg/dL (H)). ?Liver Function Tests: ?No results for input(s): AST, ALT, ALKPHOS, BILITOT, PROT, ALBUMIN in the last 168 hours. ?No results for input(s): LIPASE, AMYLASE in the last 168 hours. ?No results for input(s): AMMONIA in the last 168 hours. ?Coagulation Profile: ?No results for input(s): INR, PROTIME in the last 168 hours. ?Cardiac Enzymes: ?No results for input(s): CKTOTAL, CKMB, CKMBINDEX, TROPONINI in the last 168 hours. ?BNP (last 3 results) ?No results for input(s): PROBNP in the last 8760 hours. ?HbA1C: ?No results for input(s): HGBA1C in the last 72 hours. ?CBG: ?No results for input(s): GLUCAP in the last 168 hours. ?Lipid Profile: ?No results for input(s): CHOL, HDL, LDLCALC, TRIG, CHOLHDL, LDLDIRECT in the last 72 hours. ?Thyroid Function Tests: ?No results for input(s): TSH, T4TOTAL, FREET4, T3FREE, THYROIDAB in the last 72 hours. ?Anemia Panel: ?No results for input(s):  VITAMINB12, FOLATE, FERRITIN, TIBC, IRON, RETICCTPCT in the last 72 hours. ?Urine analysis: ?No results found for: COLORURINE, APPEARANCEUR, LABSPEC, PHURINE, GLUCOSEU, HGBUR, BILIRUBINUR, KETONESUR, PROTEINUR, UROBILINOGEN, NITRITE, LEUKOCYTESU

## 2022-01-07 NOTE — ED Provider Notes (Signed)
?[facility] ?Provider Note ? ? ?CSN: [id number] ?Arrival date & time: 01/07/22  1259 ? ?  ? ?History ? ?Chief Complaint  ?Patient presents with  ? Chest Pain  ? ? ?Jason Beltran is a 81 y.o. male. ? ?Patient is a 81 year old male who presents with shortness of breath and chest tightness.  He states he has been having some increased shortness of breath over the last 2 weeks.  He is also noticed about a 5 pound weight gain and over the last day or 2 has had some heaviness to the left side of his chest.  Nonradiating.  He does have some increased leg swelling.  He does have a known history of chronic kidney disease.  He states his creatinines recently have been running around 3.5.  He also has a history of diabetes, hypertension, aortic stenosis and diastolic dysfunction.  He is not on diuretics.  He denies any fevers or chills.  No cough.  He went to see his cardiologist, Dr. Bettina Gavia this morning who advised him to come to the emergency room for admission for treatment of decompensated heart failure in association with chronic kidney disease. ? ? ?  ? ?Home Medications ?Prior to Admission medications   ?Medication Sig Start Date End Date Taking? Authorizing Provider  ?ALOE VERA EX Apply 1 application. topically daily.   Yes [provider]  ?ALOE VERA PO Take 200 mg by mouth daily.   Yes [provider]  ?amLODipine (NORVASC) 5 MG tablet Take 5 mg by mouth daily. 06/13/21  Yes [provider]  ?aspirin 81 MG EC tablet Take 81 mg by mouth every evening.   Yes [provider]  ?Biotin 5000 MCG TABS Take 1 tablet by mouth at bedtime.   Yes [provider]  ?carvedilol (COREG) 6.25 MG tablet Take 6.25 mg by mouth 2 (two) times daily with a meal.   Yes [provider]  ?Cholecalciferol (VITAMIN D3) 50 MCG (2000 UT) TABS Take 1 tablet by mouth daily.   Yes [provider]  ?Coenzyme Q10 (COQ10) 200 MG CAPS Take 1 capsule by  mouth every evening.   Yes [provider]  ?febuxostat (ULORIC) 40 MG tablet Take 20 mg by mouth daily.   Yes [provider]  ?gabapentin (NEURONTIN) 400 MG capsule Take 400 mg by mouth 2 (two) times daily.    Yes [provider]  ?Glucosamine-Chondroit-Vit C-Mn (GLUCOSAMINE 1500 COMPLEX) CAPS Take 1 capsule by mouth daily.   Yes [provider]  ?GRAPE SEED ER PO Take 1 capsule by mouth daily.   Yes [provider]  ?hydrALAZINE (APRESOLINE) 50 MG tablet Take 50 mg by mouth 3 (three) times daily.   Yes [provider]  ?ipratropium (ATROVENT) 0.06 % nasal spray Place 2 sprays into both nostrils daily as needed for rhinitis.   Yes [provider]  ?Multiple Vitamin (MULTIVITAMIN) tablet Take 1 tablet by mouth daily.   Yes [provider]  ?Saw Palmetto 450 MG CAPS Take 3 capsules by mouth daily.   Yes [provider]  ?simvastatin (ZOCOR) 20 MG tablet Take 20 mg by mouth every evening.   Yes [provider]  ?Turmeric (QC TUMERIC COMPLEX PO) Take 1 tablet by mouth in the morning and at bedtime.   Yes [provider]  ?valACYclovir (VALTREX) 1000 MG tablet Take 1,000 mg by mouth 2 (two) times daily. For one day at onset   Yes [provider]  ?   ? ?  Allergies    ?Actos [pioglitazone], Lisinopril, Motrin [ibuprofen], and Trilipix [choline fenofibrate]   ? ?Review of Systems   ?Review of Systems  ?Constitutional:  Positive for fatigue and unexpected weight change. Negative for chills, diaphoresis and fever.  ?HENT:  Negative for congestion, rhinorrhea and sneezing.   ?Eyes: Negative.   ?Respiratory:  Positive for chest tightness. Negative for cough and shortness of breath.   ?Cardiovascular:  Positive for leg swelling. Negative for chest pain.  ?Gastrointestinal:  Negative for abdominal pain, blood in stool, diarrhea, nausea and vomiting.  ?Genitourinary:  Negative for difficulty urinating, flank pain,  frequency and hematuria.  ?Musculoskeletal:  Negative for arthralgias and back pain.  ?Skin:  Negative for rash.  ?Neurological:  Negative for dizziness, speech difficulty, weakness, numbness and headaches.  ? ?Physical Exam ?Updated Vital Signs ?BP (!) 177/64   Pulse (!) 59   Temp 97.8 ?F (36.6 ?C) (Oral)   Resp (!) 21   Ht '5\' 8"'$  (1.727 m)   Wt 91.1 kg   SpO2 95%   BMI 30.53 kg/m?  ?Physical Exam ?Constitutional:   ?   Appearance: He is well-developed.  ?HENT:  ?   Head: Normocephalic and atraumatic.  ?Eyes:  ?   Pupils: Pupils are equal, round, and reactive to light.  ?Cardiovascular:  ?   Rate and Rhythm: Normal rate and regular rhythm.  ?   Heart sounds: Murmur heard.  ?Pulmonary:  ?   Effort: Pulmonary effort is normal. No respiratory distress.  ?   Breath sounds: Normal breath sounds. No wheezing or rales.  ?Chest:  ?   Chest wall: No tenderness.  ?Abdominal:  ?   General: Bowel sounds are normal.  ?   Palpations: Abdomen is soft.  ?   Tenderness: There is no abdominal tenderness. There is no guarding or rebound.  ?Musculoskeletal:     ?   General: Normal range of motion.  ?   Cervical back: Normal range of motion and neck supple.  ?   Right lower leg: Edema present.  ?   Left lower leg: Edema present.  ?   Comments: 3+ pitting edema to lower extremities bilaterally  ?Lymphadenopathy:  ?   Cervical: No cervical adenopathy.  ?Skin: ?   General: Skin is warm and dry.  ?   Findings: No rash.  ?Neurological:  ?   Mental Status: He is alert and oriented to person, place, and time.  ? ? ?ED Results / Procedures / Treatments   ?Labs ?(all labs ordered are listed, but only abnormal results are displayed) ?Labs Reviewed  ?BASIC METABOLIC PANEL - Abnormal; Notable for the following components:  ?    Result Value  ? BUN 39 (*)   ? Creatinine, Ser 3.35 (*)   ? GFR, Estimated 18 (*)   ? All other components within normal limits  ?CBC - Abnormal; Notable for the following components:  ? RBC 3.87 (*)   ? Hemoglobin  11.6 (*)   ? HCT 35.3 (*)   ? Platelets 131 (*)   ? All other components within normal limits  ?BRAIN NATRIURETIC PEPTIDE - Abnormal; Notable for the following components:  ? B Natriuretic Peptide 1,019.3 (*)   ? All other components within normal limits  ?TROPONIN I (HIGH SENSITIVITY) - Abnormal; Notable for the following components:  ? Troponin I (High Sensitivity) 28 (*)   ? All other components within normal limits  ?TROPONIN I (HIGH SENSITIVITY)  ? ? ?EKG ?EKG Interpretation ? ?Date/Time:  Tuesday January 07 2022 13:07:08 EDT ?Ventricular Rate:  59 ?PR Interval:  204 ?QRS Duration: 86 ?QT Interval:  466 ?QTC Calculation: 461 ?R Axis:   87 ?Text Interpretation: Sinus bradycardia Abnormal QRS-T angle, consider primary T wave abnormality Abnormal ECG No previous ECGs available Confirmed by Malvin Johns [phone]) on 01/07/2022 3:02:00 PM ? ?Radiology ?DG Chest Port 1 View ? ?Result Date: 01/07/2022 ?CLINICAL DATA:  Chest pain for 2 weeks. EXAM: PORTABLE CHEST 1 VIEW COMPARISON:  04/29/2021 FINDINGS: The cardiac silhouette, mediastinal and hilar contours are within normal limits given the AP projection and portable technique. Bibasilar airspace opacity suggesting bilateral pulmonary infiltrates. There are also small bilateral pleural effusions. No findings for pulmonary edema or pulmonary lesions. The bony thorax is intact. IMPRESSION: Bibasilar pulmonary infiltrates and small bilateral pleural effusions. Electronically Signed   By: Marijo Sanes M.D.   On: 01/07/2022 15:17   ? ?Procedures ?Procedures  ? ? ?Medications Ordered in ED ?Medications  ?aspirin tablet 325 mg (325 mg Oral Given 01/07/22 1610)  ?nitroGLYCERIN (NITROSTAT) SL tablet 0.4 mg (0.4 mg Sublingual Given 01/07/22 1622)  ?furosemide (LASIX) injection 60 mg (60 mg Intravenous Given 01/07/22 1612)  ? ? ?ED Course/ Medical Decision Making/ A&P ?  ?                        ?Medical Decision Making ?Risk ?OTC drugs. ?Prescription drug management. ?Decision  regarding hospitalization. ? ? ?Patient is a 81 year old male who presents with shortness of breath and leg swelling.  Chest x-ray shows pulmonary infiltrates.  This was interpreted by me.  I suspect that this is more consiste

## 2022-01-07 NOTE — Progress Notes (Signed)
?Cardiology Office Note:   ? ?Date:  01/07/2022  ? ?ID:  Jason Beltran, DOB 02-14-1941, MRN [id number] ? ?PCP:  Angelina Sheriff, MD  ?Cardiologist:  Louie Casa, RN   ? ?Referring MD: Angelina Sheriff, MD  ? ? ?ASSESSMENT:   ? ?1. Hypertension, renal disease, stage 1-4 or unspecified chronic kidney disease   ?2. Nonrheumatic aortic valve stenosis   ? ?PLAN:   ? ?In order of problems listed above: ? ?Presents to my office with obvious decompensated heart failure in the setting of severe kidney disease.  I am unsure if this accounts for all of his symptoms but clearly will need inpatient care and will need to be supervised by nephrology do not think there is any opportunity to diurese him as an outpatient safely and at times this is a trigger to initiate renal replacement treatment. ?chest pain is possible angina EKG is normal he will need evaluation with high-sensitivity troponin likely a delta does not appear to have pericarditis with his kidney disease ultimately I think an echocardiogram would be helpful to redefine his ejection fraction normal on echocardiogram in December obviously he does not have severe aortic stenosis ? ? ?Next appointment: Determine after evaluation ? ? ?Medication Adjustments/Labs and Tests Ordered: ?Current medicines are reviewed at length with the patient today.  Concerns regarding medicines are outlined above.  ?No orders of the defined types were placed in this encounter. ? ?No orders of the defined types were placed in this encounter. ? ? ?Chief Complaint  ?Patient presents with  ? Chest Pain  ? Shortness of Breath  ?  Ongoing for 2 weeks   ? ? ?History of Present Illness:   ? ?Jason Beltran is a 81 y.o. male with a hx of mild aortic stenosis hypertension with CKD stage IV followed by oncology and type 2 diabetes last seen 07/08/2021. ? ?Compliance with diet, lifestyle and medications: Yes ? ?This was a routine visit quickly pivoted to a sick visit ?He tells me about 1 to 2 weeks.   Seen by nephrology GFR 90 cc and there is discussion about placing a fistula to prepare for hemodialysis ?Is gained a significant amount of weight he has a great deal of edema mildly short of breath at rest and does not feel well ?For last 2 weeks he is intermittently having chest pain he describes as a pressure in the chest and has been worsening last 24 hours and still vaguely there is never resolved ?Is not pleuritic not exertional in nature. ?He feels quite weak no fever chills or cough ?His EKG in my office shows sinus rhythm 57 bpm first-degree AV block he has no ischemic ST-T changes or pattern of infarction ?Physical examination shows obvious decompensated heart failure greater than 4+ edema to the thighs presacral edema moderate neck vein distention and likely a right pleural effusion exam and a third heart sound ?At a minimum he has acute decompensated heart failure associated with his severe kidney disease ?I am unsure if his chest pain is indicative of acute coronary syndrome he will need high-sensitivity troponin unlikely and delta but I be hesitant to do urgent catheterization.  It does not seem to be pericardial or pleural in nature. ?I think he is best served by going to the emergency room [facility] for evaluation and coordination with hospitalist nephrologist cc Dr. Myrene Buddy and cardiologist in a consultative role ?His wife feels comfortable driving there ? ?An echocardiogram performed 07/24/2021.  Aortic valve is mildly calcified trileaflet mildly thickened he had no regurgitation and mild aortic stenosis seen with a mean gradient of 10 mmHg valve area 1.2 cm?.  Right ventricle is normal in size mild concentric LVH grade 2 diastolic dysfunction right ventricle normal size function with mildly elevated pulmonary artery systolic pressure mild mitral regurgitation was seen. ? 1. Left ventricular ejection fraction, by estimation, is 55 to 60%. The  ?left ventricle has normal function.  The left ventricle has no regional  ?wall motion abnormalities. There is mild left ventricular hypertrophy.  ?Left ventricular diastolic parameters  ?are consistent with Grade II diastolic dysfunction (pseudonormalization).  ? 2. Right ventricular systolic function is normal. The right ventricular  ?size is normal. There is mildly elevated pulmonary artery systolic  ?pressure.  ? 3. Left atrial size was mildly dilated.  ? 4. The mitral valve is normal in structure. Mild mitral valve  ?regurgitation. No evidence of mitral stenosis.  ? 5. The aortic valve is normal in structure. There is moderate  ?calcification of the aortic valve. There is mild thickening of the aortic  ?valve. Aortic valve regurgitation is not visualized. Mild aortic valve  ?stenosis. Aortic valve area, by VTI measures  ?1.18 cm?Marland Kitchen Aortic valve mean gradient measures 9.7 mmHg.  ? 6. The inferior vena cava is normal in size with greater than 50%  ?respiratory variability, suggesting right atrial pressure of 3 mmHg.  ?Past Medical History:  ?Diagnosis Date  ? Anemia of chronic disease   ? Aortic stenosis 05/08/2018  ? Very mild mean 9 mm Hg  ? Chronic sinusitis   ? CKD (chronic kidney disease)   ? Diabetes mellitus type 2, uncontrolled   ? Diastolic CHF ([place])   ? Diastolic dysfunction 03/16/6377  ? Essential hypertension 05/06/2018  ? Hyperparathyroidism ([place])   ? Hypertension   ? Neuropathy, peripheral   ? Obesity   ? Type 2 diabetes mellitus ([place]) 05/06/2018  ? ? ?Past Surgical History:  ?Procedure Laterality Date  ? ANKLE SURGERY Right 01/20/2012  ? NASAL SINUS SURGERY    ? VASECTOMY    ? ? ?Current Medications: ?Current Meds  ?Medication Sig  ? ALOE VERA EX Apply 1 application. topically daily.  ? ALOE VERA PO Take 200 mg by mouth daily.  ? amLODipine (NORVASC) 5 MG tablet Take 5 mg by mouth daily.  ? aspirin 81 MG EC tablet Take 81 mg by mouth daily. Swallow whole.  ? Biotin 5000 MCG TABS Take 1 tablet by mouth at bedtime.  ? carvedilol (COREG) 6.25  MG tablet Take 6.25 mg by mouth 2 (two) times daily with a meal.  ? Cholecalciferol (VITAMIN D3) 50 MCG (2000 UT) TABS Take 1 tablet by mouth daily.  ? Coenzyme Q10 (COQ10) 200 MG CAPS Take 1 capsule by mouth daily.  ? febuxostat (ULORIC) 40 MG tablet Take 20 mg by mouth daily.  ? gabapentin (NEURONTIN) 400 MG capsule Take 400 mg by mouth 2 (two) times daily.   ? Glucosamine-Chondroit-Vit C-Mn (GLUCOSAMINE 1500 COMPLEX) CAPS Take 1 capsule by mouth daily.  ? GRAPE SEED ER PO Take 1 capsule by mouth daily.  ? hydrALAZINE (APRESOLINE) 50 MG tablet Take 50 mg by mouth 3 (three) times daily.  ? ipratropium (ATROVENT) 0.06 % nasal spray Place 2 sprays into both nostrils daily as needed for rhinitis.  ? Multiple Vitamin (MULTIVITAMIN) tablet Take 1 tablet by mouth daily.  ? Saw Palmetto 450 MG CAPS Take 3 capsules by mouth daily.  ?  simvastatin (ZOCOR) 20 MG tablet Take 20 mg by mouth daily.  ? Turmeric (QC TUMERIC COMPLEX PO) Take 1 tablet by mouth in the morning and at bedtime.  ? valACYclovir (VALTREX) 1000 MG tablet Take 1,000 mg by mouth 2 (two) times daily. For one day at onset  ?  ? ?Allergies:   Actos [pioglitazone], Lisinopril, Motrin [ibuprofen], and Trilipix [choline fenofibrate]  ? ?Social History  ? ?Socioeconomic History  ? Marital status: Married  ?  Spouse name: Not on file  ? Number of children: Not on file  ? Years of education: Not on file  ? Highest education level: Not on file  ?Occupational History  ? Not on file  ?Tobacco Use  ? Smoking status: Never  ? Smokeless tobacco: Never  ?Vaping Use  ? Vaping Use: Never used  ?Substance and Sexual Activity  ? Alcohol use: Yes  ?  Comment: occasional glass of wine  ? Drug use: Never  ? Sexual activity: Not on file  ?Other Topics Concern  ? Not on file  ?Social History Narrative  ? Not on file  ? ?Social Determinants of Health  ? ?Financial Resource Strain: Not on file  ?Food Insecurity: Not on file  ?Transportation Needs: Not on file  ?Physical Activity: Not  on file  ?Stress: Not on file  ?Social Connections: Not on file  ?  ? ?Family History: ?The patient's family history includes Congestive Heart Failure in his mother; Heart failure in his father; Pol

## 2022-01-07 NOTE — ED Triage Notes (Signed)
Patient sent by Dr. Bettina Gavia for further work up. Patient reports chest pain/heaviness two weeks ago that subsided then restarted at 5am yesterday and has been constant since. Reports shortness of breath with exertion. States he has CHF and stage 3 kidney disease and his ankles have been swelling.  ?

## 2022-01-07 NOTE — Plan of Care (Signed)
°  Problem: Education: °Goal: Ability to demonstrate management of disease process will improve °Outcome: Progressing °  °Problem: Education: °Goal: Ability to verbalize understanding of medication therapies will improve °Outcome: Progressing °  °Problem: Education: °Goal: Individualized Educational Video(s) °Outcome: Progressing °  °Problem: Activity: °Goal: Capacity to carry out activities will improve °Outcome: Progressing °  °

## 2022-01-07 NOTE — ED Provider Triage Note (Addendum)
Emergency Medicine Provider Triage Evaluation Note ? ?Jason Beltran , a 81 y.o. male  was evaluated in triage.  Pt complains of chest pain/heaviness for the last 2 weeks.  Patient reports the chest pain subsided but began again yesterday morning at 5 AM and has been constant since.  Patient endorsing shortness of breath with exertion.  Patient also endorsing lower extremity swelling.  Patient seen at his cardiologist, Dr. Bettina Gavia, and sent here for evaluation of acute decompensated heart failure.  Patient also has CKD stage IV. ? ?Review of Systems  ?Positive:  ?Negative:  ? ?Physical Exam  ?BP (!) 177/79 (BP Location: Right Arm)   Pulse (!) 58   Temp 98.4 ?F (36.9 ?C) (Oral)   Resp 16   Ht '5\' 8"'$  (1.727 m)   Wt 91.1 kg   SpO2 96%   BMI 30.53 kg/m?  ?Gen:   Awake, no distress   ?Resp:  Normal effort  ?MSK:   Moves extremities without difficulty  ?Other:  Bilateral JVD noted.  Patient has 2+ pitting edema bilaterally.  Patient also has erythema to lower leg of right side. ? ?Medical Decision Making  ?Medically screening exam initiated at 1:46 PM.  Appropriate orders placed.  Obrien Huskins was informed that the remainder of the evaluation will be completed by another provider, this initial triage assessment does not replace that evaluation, and the importance of remaining in the ED until their evaluation is complete. ? ? ?  ?Azucena Cecil, PA-C ?01/07/22 1349 ? ?  ?Azucena Cecil, PA-C ?01/07/22 1349 ? ?

## 2022-01-07 NOTE — Patient Instructions (Signed)
Medication Instructions:  ?Your physician recommends that you continue on your current medications as directed. Please refer to the Current Medication list given to you today. ? ?*If you need a refill on your cardiac medications before your next appointment, please call your pharmacy* ? ? ?Lab Work: ?None ?If you have labs (blood work) drawn today and your tests are completely normal, you will receive your results only by: ?MyChart Message (if you have MyChart) OR ?A paper copy in the mail ?If you have any lab test that is abnormal or we need to change your treatment, we will call you to review the results. ? ? ?Testing/Procedures: ?None ? ? ?Follow-Up: ?At [facility], you and your health needs are our priority.  As part of our continuing mission to provide you with exceptional heart care, we have created designated Provider Care Teams.  These Care Teams include your primary Cardiologist (physician) and Advanced Practice Providers (APPs -  Physician Assistants and Nurse Practitioners) who all work together to provide you with the care you need, when you need it. ? ?We recommend signing up for the patient portal called "MyChart".  Sign up information is provided on this After Visit Summary.  MyChart is used to connect with patients for Virtual Visits (Telemedicine).  Patients are able to view lab/test results, encounter notes, upcoming appointments, etc.  Non-urgent messages can be sent to your provider as well.   ?To learn more about what you can do with MyChart, go to [email or web address].   ? ?Your next appointment:   ?To be decided - patient sent to the ER ? ?The format for your next appointment:   ?In Person ? ?Provider:   ?Shirlee More, MD ? ? ?Other Instructions ?None ? ?Important Information About Sugar ? ? ? ? ? ? ?

## 2022-01-08 ENCOUNTER — Inpatient Hospital Stay (HOSPITAL_COMMUNITY): Payer: Medicare Other

## 2022-01-08 DIAGNOSIS — R079 Chest pain, unspecified: Secondary | ICD-10-CM

## 2022-01-08 DIAGNOSIS — I509 Heart failure, unspecified: Secondary | ICD-10-CM

## 2022-01-08 DIAGNOSIS — I5033 Acute on chronic diastolic (congestive) heart failure: Secondary | ICD-10-CM

## 2022-01-08 DIAGNOSIS — N184 Chronic kidney disease, stage 4 (severe): Secondary | ICD-10-CM | POA: Diagnosis not present

## 2022-01-08 LAB — ECHOCARDIOGRAM COMPLETE
AR max vel: 2.03 cm2
AV Peak grad: 18.2 mmHg
Ao pk vel: 2.14 m/s
Area-P 1/2: 5.31 cm2
Height: 68 in
S' Lateral: 4.4 cm
Single Plane A4C EF: 38.7 %
Weight: 2944 oz

## 2022-01-08 LAB — BASIC METABOLIC PANEL
Anion gap: 8 (ref 5–15)
BUN: 42 mg/dL — ABNORMAL HIGH (ref 8–23)
CO2: 22 mmol/L (ref 22–32)
Calcium: 9 mg/dL (ref 8.9–10.3)
Chloride: 110 mmol/L (ref 98–111)
Creatinine, Ser: 3.64 mg/dL — ABNORMAL HIGH (ref 0.61–1.24)
GFR, Estimated: 16 mL/min — ABNORMAL LOW (ref >60)
Glucose, Bld: 95 mg/dL (ref 70–99)
Potassium: 4.1 mmol/L (ref 3.5–5.1)
Sodium: 140 mmol/L (ref 135–145)

## 2022-01-08 LAB — HEMOGLOBIN A1C
Hgb A1c MFr Bld: 5.2 % (ref 4.8–5.6)
Mean Plasma Glucose: 102.54 mg/dL

## 2022-01-08 NOTE — Consult Note (Signed)
Reason for Consult: worsening renal function  ?Referring Physician: Dr. Santo Held  ? ?Chief Complaint: dyspnea and leg swelling  ? ?Assessment/Plan: ?AKI in the setting of CKD stage 4: Cr 3.64 with GFR 16 and BUN 42 this morning compared to 3.35 on admission. Baseline Cr appears to be around 3. Maintains routine outpatient nephrology follow up with Dr. Justin Mend and now Dr. Osborne Casco. Etiology possibly prerenal in the setting of chronic hypertension along with recent diuretic administration. Cr 3.1 recently, seems to be an acute change likely secondary to lasix use. Renal ultrasound in Dec 2021 notable for increased echogenicity within the renal parenchyma with diffuse cortical thinning without hydronephrosis or other acute abnormality with simple bilateral renal cysts. No need for emergency dialysis at this time. Output 2.75L over the past 24 hours.  ? -continue IV lasix, transition to oral diuresis once euvolemic which he will likely need on discharge, will continue to monitor Cr with further diuresis   ? -monitor I/Os and daily weights ? -avoid nephrotoxic meds as able ? -renally dose meds if GFR<15 ? -RD consult placed to provide further diet education and to discuss Na restriction, appreciate assistance with this  ?Diastolic Heart Failure : BNP 1019 on admission, most recent echo in Nov 2022 demonstrated EF 55-60% consistent with Grade 2 diastolic dysfunction with mild mitral valve regurgitation and mild aortic valve stenosis with moderate aortic valve calcification. Does not appear to be on home diuresis. Pending repeat echo today. S/p lasix 60 mg on 4/25 and now IV lasix 40 mg bid.  ?Type 2 DM: No recent A1c, possible home meds pioglitazone and lantus held. Monitor CBGs per primary team.  ?Normocytic anemia: Hgb 11.6 with MCV 91.2. Likely secondary to anemia of chronic diease. Continue to monitor per primary team. ?Hypertension: Home med amlodipine held given edema. Continue coreg and hydralazine per primary team.  Monitor BP.  ?  ?HPI: Jason Beltran is an 81 y.o. male presents with dyspnea and leg swelling. He has a past medical history of hypertension, Type 2 DM, HLD, CKD stage 4 and diastolic heart failure. Admitted for acute respiratory distress likely secondary to heart failure exacerbation. Lives at home with wife and is able to complete all ADLs and is very active. Son also at bedside. Denies dysuria, hematuria and urinary issues. He denies any concerns today, he reports feeling better.  ? ?ROS ?Pertinent items are noted in HPI. ? ?Chemistry and CBC: ?Creatinine, Ser  ?Date/Time Value Ref Range Status  ?01/08/2022 02:33 AM 3.64 (H) 0.61 - 1.24 mg/dL Final  ?01/07/2022 01:46 PM 3.35 (H) 0.61 - 1.24 mg/dL Final  ? ?Recent Labs  ?Lab 01/07/22 ?1346 01/08/22 ?0233  ?NA 139 140  ?K 4.6 4.1  ?CL 108 110  ?CO2 24 22  ?GLUCOSE 94 95  ?BUN 39* 42*  ?CREATININE 3.35* 3.64*  ?CALCIUM 9.2 9.0  ? ?Recent Labs  ?Lab 01/07/22 ?1346  ?WBC 5.7  ?HGB 11.6*  ?HCT 35.3*  ?MCV 91.2  ?PLT 131*  ? ?Liver Function Tests: ?No results for input(s): AST, ALT, ALKPHOS, BILITOT, PROT, ALBUMIN in the last 168 hours. ?No results for input(s): LIPASE, AMYLASE in the last 168 hours. ?No results for input(s): AMMONIA in the last 168 hours. ?Cardiac Enzymes: ?No results for input(s): CKTOTAL, CKMB, CKMBINDEX, TROPONINI in the last 168 hours. ?Iron Studies: No results for input(s): IRON, TIBC, TRANSFERRIN, FERRITIN in the last 72 hours. ?PT/INR: ?'@LABRCNTIP'$ (inr:5) ? ?Xrays/Other Studies: ?) ?Results for orders placed or performed during the hospital encounter of 01/07/22 (  from the past 48 hour(s))  ?Basic metabolic panel     Status: Abnormal  ? Collection Time: 01/07/22  1:46 PM  ?Result Value Ref Range  ? Sodium 139 135 - 145 mmol/L  ? Potassium 4.6 3.5 - 5.1 mmol/L  ? Chloride 108 98 - 111 mmol/L  ? CO2 24 22 - 32 mmol/L  ? Glucose, Bld 94 70 - 99 mg/dL  ?  Comment: Glucose reference range applies only to samples taken after fasting for at least 8 hours.   ? BUN 39 (H) 8 - 23 mg/dL  ? Creatinine, Ser 3.35 (H) 0.61 - 1.24 mg/dL  ? Calcium 9.2 8.9 - 10.3 mg/dL  ? GFR, Estimated 18 (L) >60 mL/min  ?  Comment: (NOTE) ?Calculated using the CKD-EPI Creatinine Equation (2021) ?  ? Anion gap 7 5 - 15  ?  Comment: Performed at [facility], [place] [address], [place], [place] [address]  ?CBC     Status: Abnormal  ? Collection Time: 01/07/22  1:46 PM  ?Result Value Ref Range  ? WBC 5.7 4.0 - 10.5 K/uL  ? RBC 3.87 (L) 4.22 - 5.81 MIL/uL  ? Hemoglobin 11.6 (L) 13.0 - 17.0 g/dL  ? HCT 35.3 (L) 39.0 - 52.0 %  ? MCV 91.2 80.0 - 100.0 fL  ? MCH 30.0 26.0 - 34.0 pg  ? MCHC 32.9 30.0 - 36.0 g/dL  ? RDW 14.0 11.5 - 15.5 %  ? Platelets 131 (L) 150 - 400 K/uL  ? nRBC 0.0 0.0 - 0.2 %  ?  Comment: Performed at [facility], [place] [address] [place], [place] [address]  ?Troponin I (High Sensitivity)     Status: Abnormal  ? Collection Time: 01/07/22  1:46 PM  ?Result Value Ref Range  ? Troponin I (High Sensitivity) 28 (H) <18 ng/L  ?  Comment: (NOTE) ?Elevated high sensitivity troponin I (hsTnI) values and significant  ?changes across serial measurements may suggest ACS but many other  ?chronic and acute conditions are known to elevate hsTnI results.  ?Refer to the "Links" section for chest pain algorithms and additional  ?guidance. ?Performed at [facility], [place] [address], [place], [place] ?[address] ?  ?Brain natriuretic peptide     Status: Abnormal  ? Collection Time: 01/07/22  1:46 PM  ?Result Value Ref Range  ? B Natriuretic Peptide 1,019.3 (H) 0.0 - 100.0 pg/mL  ?  Comment: Performed at [facility], [place] [address], [place], [place] [address]  ?Troponin I (High Sensitivity)     Status: Abnormal  ? Collection Time: 01/07/22  4:18 PM  ?Result Value Ref Range  ? Troponin I (High Sensitivity) 26 (H) <18 ng/L  ?  Comment: (NOTE) ?Elevated high sensitivity troponin I (hsTnI) values and significant  ?changes across serial measurements may suggest ACS but many other   ?chronic and acute conditions are known to elevate hsTnI results.  ?Refer to the "Links" section for chest pain algorithms and additional  ?guidance. ?Performed at [facility], [place] [address], [place], [place] ?[address] ?  ?Basic metabolic panel     Status: Abnormal  ? Collection Time: 01/08/22  2:33 AM  ?Result Value Ref Range  ? Sodium 140 135 - 145 mmol/L  ? Potassium 4.1 3.5 - 5.1 mmol/L  ? Chloride 110 98 - 111 mmol/L  ? CO2 22 22 - 32 mmol/L  ? Glucose, Bld 95 70 - 99 mg/dL  ?  Comment:  Glucose reference range applies only to samples taken after fasting for at least 8 hours.  ? BUN 42 (H) 8 - 23 mg/dL  ? Creatinine, Ser 3.64 (H) 0.61 - 1.24 mg/dL  ? Calcium 9.0 8.9 - 10.3 mg/dL  ? GFR, Estimated 16 (L) >60 mL/min  ?  Comment: (NOTE) ?Calculated using the CKD-EPI Creatinine Equation (2021) ?  ? Anion gap 8 5 - 15  ?  Comment: Performed at [facility], [place] [address], [place], [place] [address]  ? ?DG Chest Port 1 View ? ?Result Date: 01/07/2022 ?CLINICAL DATA:  Chest pain for 2 weeks. EXAM: PORTABLE CHEST 1 VIEW COMPARISON:  04/29/2021 FINDINGS: The cardiac silhouette, mediastinal and hilar contours are within normal limits given the AP projection and portable technique. Bibasilar airspace opacity suggesting bilateral pulmonary infiltrates. There are also small bilateral pleural effusions. No findings for pulmonary edema or pulmonary lesions. The bony thorax is intact. IMPRESSION: Bibasilar pulmonary infiltrates and small bilateral pleural effusions. Electronically Signed   By: Marijo Sanes M.D.   On: 01/07/2022 15:17   ? ?PMH:   ?Past Medical History:  ?Diagnosis Date  ? Anemia of chronic disease   ? Aortic stenosis 05/08/2018  ? Very mild mean 9 mm Hg  ? Chronic sinusitis   ? CKD (chronic kidney disease)   ? Diabetes mellitus type 2, uncontrolled   ? Diastolic CHF ([place])   ? Diastolic dysfunction 8/88/9169  ? Essential hypertension 05/06/2018  ? Hyperparathyroidism ([place])   ? Hypertension   ?  Neuropathy, peripheral   ? Obesity   ? Type 2 diabetes mellitus ([place]) 05/06/2018  ? ? ?PSH:   ?Past Surgical History:  ?Procedure Laterality Date  ? ANKLE SURGERY Right 01/20/2012  ? NASAL SINUS SURGERY    ? VASE

## 2022-01-08 NOTE — Progress Notes (Signed)
?PROGRESS NOTE ? ? ? ?Jason Beltran  INO:[id number] DOB: [birth year] DOA: 01/07/2022 ?PCP: Angelina Sheriff, MD  ? 81 y.o. male with medical history significant of chronic diastolic heart failure, CKD stage IV, hypertension, diet-controlled diabetes, peripheral neuropathy with chronic leg swelling presenting to the emergency room with worsening shortness of breath, chest tightness associated with exertion worse for 2 weeks. ?-Seen in cardiology clinic and sent to the ED, in the emergency room noted to be edematous, chest x-ray noted pulmonary vascular congestion, creatinine was 3.3 ? ?Subjective: Feels a little better today, anxious to know what his kidney function is doing ? ? ?Assessment and Plan: ? ?Acute on chronic diastolic CHF ?Essential hypertension ?-Diuresing with IV Lasix, urine output of 2.8 L yesterday ?-Clinically improving electrolytes are stable, no indication for hemodialysis at this time ?-Nephrology consulting, GDMT limited by CKD ?-Monitor urine output, BMP in a.m. ?-Repeat echo completed this morning, will follow-up ? ?CKD 4 ?-Creatinine stable from baseline, per Dr. Joya Gaskins note was 3.4 on 11/26/2021 ?-Monitor with diuresis, nephrology following, see discussion above ? ?Type 2 diabetes mellitus ?-CBGs are stable, insulin on hold, check A1c ? ?Normocytic anemia ?-Likely from CKD, continue to trend ? ?DVT prophylaxis: Heparin subcutaneous  ?code Status: Full code ?Family Communication: Discussed with patient and family at bedside ?Disposition Plan: Home in 1 to 2 days ? ?Consultants:  ?Nephrology ? ?Procedures:  ? ?Antimicrobials:  ? ? ?Objective: ?Vitals:  ? 01/07/22 1946 01/07/22 2310 01/08/22 0520 01/08/22 1053  ?BP: (!) 170/68 (!) 138/41 119/65 (!) 152/62  ?Pulse: (!) 56 (!) 59 63 63  ?Resp:   18 17  ?Temp: 98 ?F (36.7 ?C) 98 ?F (36.7 ?C) 98 ?F (36.7 ?C) (!) 97.4 ?F (36.3 ?C)  ?TempSrc:   Oral Oral  ?SpO2: 95% 92% 92% 94%  ?Weight:   83.5 kg   ?Height:      ? ? ?Intake/Output Summary (Last 24  hours) at 01/08/2022 1248 ?Last data filed at 01/08/2022 1053 ?Gross per 24 hour  ?Intake 120 ml  ?Output 3550 ml  ?Net -3430 ml  ? ?Filed Weights  ? 01/07/22 1335 01/08/22 0520  ?Weight: 91.1 kg 83.5 kg  ? ? ?Examination: ? ?General exam: Elderly pleasant male sitting up in bed, AAOx3, no distress ?HEENT: No JVD ?CVS: S1-S2, regular rhythm ?Lungs: Few basilar rales otherwise clear ?Abdomen: Soft, nontender, bowel sounds present ?Extremities: Trace edema  ?Skin: No rashes ?Psychiatry:  Mood & affect appropriate.  ? ? ? ?Data Reviewed:  ? ?CBC: ?Recent Labs  ?Lab 01/07/22 ?1346  ?WBC 5.7  ?HGB 11.6*  ?HCT 35.3*  ?MCV 91.2  ?PLT 131*  ? ?Basic Metabolic Panel: ?Recent Labs  ?Lab 01/07/22 ?1346 01/08/22 ?0233  ?NA 139 140  ?K 4.6 4.1  ?CL 108 110  ?CO2 24 22  ?GLUCOSE 94 95  ?BUN 39* 42*  ?CREATININE 3.35* 3.64*  ?CALCIUM 9.2 9.0  ? ?GFR: ?Estimated Creatinine Clearance: 16.2 mL/min (A) (by C-G formula based on SCr of 3.64 mg/dL (H)). ?Liver Function Tests: ?No results for input(s): AST, ALT, ALKPHOS, BILITOT, PROT, ALBUMIN in the last 168 hours. ?No results for input(s): LIPASE, AMYLASE in the last 168 hours. ?No results for input(s): AMMONIA in the last 168 hours. ?Coagulation Profile: ?No results for input(s): INR, PROTIME in the last 168 hours. ?Cardiac Enzymes: ?No results for input(s): CKTOTAL, CKMB, CKMBINDEX, TROPONINI in the last 168 hours. ?BNP (last 3 results) ?No results for input(s): PROBNP in the last 8760 hours. ?HbA1C: ?No results  for input(s): HGBA1C in the last 72 hours. ?CBG: ?No results for input(s): GLUCAP in the last 168 hours. ?Lipid Profile: ?No results for input(s): CHOL, HDL, LDLCALC, TRIG, CHOLHDL, LDLDIRECT in the last 72 hours. ?Thyroid Function Tests: ?No results for input(s): TSH, T4TOTAL, FREET4, T3FREE, THYROIDAB in the last 72 hours. ?Anemia Panel: ?No results for input(s): VITAMINB12, FOLATE, FERRITIN, TIBC, IRON, RETICCTPCT in the last 72 hours. ?Urine analysis: ?No results found for:  COLORURINE, APPEARANCEUR, [place], [place], [place], [place], [place], KETONESUR, PROTEINUR, [place], NITRITE, LEUKOCYTESUR ?Sepsis Labs: ?'@LABRCNTIP'$ (procalcitonin:4,lacticidven:4) ? ?)No results found for this or any previous visit (from the past 240 hour(s)).  ? ?Radiology Studies: ?DG Chest Port 1 View ? ?Result Date: 01/07/2022 ?CLINICAL DATA:  Chest pain for 2 weeks. EXAM: PORTABLE CHEST 1 VIEW COMPARISON:  04/29/2021 FINDINGS: The cardiac silhouette, mediastinal and hilar contours are within normal limits given the AP projection and portable technique. Bibasilar airspace opacity suggesting bilateral pulmonary infiltrates. There are also small bilateral pleural effusions. No findings for pulmonary edema or pulmonary lesions. The bony thorax is intact. IMPRESSION: Bibasilar pulmonary infiltrates and small bilateral pleural effusions. Electronically Signed   By: Marijo Sanes M.D.   On: 01/07/2022 15:17   ? ? ?Scheduled Meds: ? aspirin  325 mg Oral Daily  ? carvedilol  6.25 mg Oral BID WC  ? furosemide  40 mg Intravenous BID  ? heparin  5,000 Units Subcutaneous Q8H  ? hydrALAZINE  50 mg Oral TID  ? ?Continuous Infusions: ? ? LOS: 1 day  ? ? ?Time spent: 70mn ? ?PDomenic Polite MD ?[facility] ? ? ?01/08/2022, 12:48 PM  ?  ?

## 2022-01-08 NOTE — Plan of Care (Signed)
?  Problem: Education: ?Goal: Ability to demonstrate management of disease process will improve ?01/08/2022 0805 by Marcille Blanco, RN ?Outcome: Progressing ?01/07/2022 1829 by Marcille Blanco, RN ?Outcome: Progressing ?  ?Problem: Education: ?Goal: Ability to verbalize understanding of medication therapies will improve ?01/08/2022 0805 by Marcille Blanco, RN ?Outcome: Progressing ?01/07/2022 1829 by Marcille Blanco, RN ?Outcome: Progressing ?  ?Problem: Education: ?Goal: Individualized Educational Video(s) ?01/08/2022 0805 by Marcille Blanco, RN ?Outcome: Progressing ?01/07/2022 1829 by Marcille Blanco, RN ?Outcome: Progressing ?  ?Problem: Activity: ?Goal: Capacity to carry out activities will improve ?01/08/2022 0805 by Marcille Blanco, RN ?Outcome: Progressing ?01/07/2022 1829 by Marcille Blanco, RN ?Outcome: Progressing ?  ?

## 2022-01-08 NOTE — Progress Notes (Signed)
[facility] night cross cover note: ? ?I was notified by RN of the patient's elevated blood pressures, with systolic values currently in the 170s mmHg, with associated heart rates in the 60s to 70s.  ? ?Per my chart review, including review of hospitalist H&P, this is an 81 year old male with history of stage IV CKD who is admitted earlier in the day for acute volume overload in the setting of acute on chronic diastolic heart failure for which IV diuresis has been initiated.  ? ?Per review of hospitalist documentation, outpatient antihypertensive medications include amlodipine, hydralazine and Coreg, with plan to hold amlodipine in the setting of peripheral edema, while resuming Coreg and hydralazine.  Based upon this plan, I have resumed home oral hydralazine, and conveyed to RN that next dose can be given now.  Also resumed home Coreg with next dose scheduled to occur tomorrow morning.  Anticipate additional antihypertensive trend from ensuing IV diuresis efforts.  ? ? ? ? ?Babs Bertin, DO ?Hospitalist ? ?

## 2022-01-09 DIAGNOSIS — R079 Chest pain, unspecified: Secondary | ICD-10-CM | POA: Diagnosis not present

## 2022-01-09 DIAGNOSIS — I509 Heart failure, unspecified: Secondary | ICD-10-CM | POA: Diagnosis not present

## 2022-01-09 DIAGNOSIS — N184 Chronic kidney disease, stage 4 (severe): Secondary | ICD-10-CM | POA: Diagnosis not present

## 2022-01-09 LAB — CBC
HCT: 33.3 % — ABNORMAL LOW (ref 39.0–52.0)
Hemoglobin: 11.5 g/dL — ABNORMAL LOW (ref 13.0–17.0)
MCH: 30.2 pg (ref 26.0–34.0)
MCHC: 34.5 g/dL (ref 30.0–36.0)
MCV: 87.4 fL (ref 80.0–100.0)
Platelets: 129 10*3/uL — ABNORMAL LOW (ref 150–400)
RBC: 3.81 MIL/uL — ABNORMAL LOW (ref 4.22–5.81)
RDW: 13.7 % (ref 11.5–15.5)
WBC: 4.9 10*3/uL (ref 4.0–10.5)
nRBC: 0 % (ref 0.0–0.2)

## 2022-01-09 LAB — BASIC METABOLIC PANEL
Anion gap: 9 (ref 5–15)
BUN: 43 mg/dL — ABNORMAL HIGH (ref 8–23)
CO2: 25 mmol/L (ref 22–32)
Calcium: 8.8 mg/dL — ABNORMAL LOW (ref 8.9–10.3)
Chloride: 107 mmol/L (ref 98–111)
Creatinine, Ser: 3.81 mg/dL — ABNORMAL HIGH (ref 0.61–1.24)
GFR, Estimated: 15 mL/min — ABNORMAL LOW (ref >60)
Glucose, Bld: 118 mg/dL — ABNORMAL HIGH (ref 70–99)
Potassium: 3.8 mmol/L (ref 3.5–5.1)
Sodium: 141 mmol/L (ref 135–145)

## 2022-01-09 MED ORDER — ASPIRIN EC 81 MG PO TBEC
81.0000 mg | DELAYED_RELEASE_TABLET | Freq: Every day | ORAL | Status: DC
  Administered 2022-01-10: 81 mg via ORAL
  Filled 2022-01-09: qty 1

## 2022-01-09 MED ORDER — ENSURE ENLIVE PO LIQD
237.0000 mL | Freq: Two times a day (BID) | ORAL | Status: DC
  Administered 2022-01-09 – 2022-01-10 (×2): 237 mL via ORAL

## 2022-01-09 MED ORDER — TORSEMIDE 20 MG PO TABS
40.0000 mg | ORAL_TABLET | Freq: Every day | ORAL | Status: DC
  Administered 2022-01-09 – 2022-01-10 (×2): 40 mg via ORAL
  Filled 2022-01-09 (×2): qty 2

## 2022-01-09 NOTE — Progress Notes (Signed)
?[facility] ?Progress Note  ? ?Patient is a 81 y.o. male presents with dyspnea and leg swelling. He has a past medical history of hypertension, Type 2 DM, HLD, CKD stage 4 and diastolic heart failure. Admitted for acute respiratory distress likely secondary to heart failure exacerbation. ? ?Assessment/ Plan:   ?AKI in the setting of CKD stage 4: Cr 3.64>3.81 with GFR 15 and BUN 43 this morning compared to 3.35 on admission. Baseline Cr appears to be around 3. Maintains routine outpatient nephrology follow up previously with Dr. Justin Mend and now Dr. Osborne Casco. Etiology possibly prerenal in the setting of chronic hypertension along with recent diuretic administration. Cr 3.1 recently, seems to be an acute change likely secondary to lasix use. Renal ultrasound in Dec 2021 notable for increased echogenicity within the renal parenchyma with diffuse cortical thinning without hydronephrosis or other acute abnormality with simple bilateral renal cysts. No need for emergency dialysis at this time. Output 3.47L over the past 24 hours, down 6.7L since admission. Overall stable Cr and responding well to diuresis, can transition to oral diuretic.  ?            -discontinued IV lasix, starting torsemide 40 mg daily ? -monitor Cr trend ?            -monitor I/Os and daily weights ?            -avoid nephrotoxic meds as able ?            -renally dose meds if GFR<15 ?            -RD recs for diet education regarding Na restriction restriction. ?Diastolic Heart Failure : BNP 1019 on admission, most recent echo in Nov 2022 demonstrated EF 55-60% consistent with Grade 2 diastolic dysfunction with mild mitral valve regurgitation and mild aortic valve stenosis with moderate aortic valve calcification. Does not appear to be on home diuresis. Echo from 4/26 demonstrated EF 40-45 % with mild anterolateral and posterolateral hypokinesis.  S/p lasix 60 mg on 4/25 then transitioned to IV lasix 40 mg bid. Now that patient is  euvolemic, can begin oral diuresis which he will likely continue at discharge. ?Type 2 DM: Recent A1c 5.2. Monitor glucose per primary team.  ?Normocytic anemia: Hgb 11.5 with MCV 87.4. Likely secondary to anemia of chronic diease. Continue to monitor per primary team. ?Hypertension: Home med amlodipine held given edema. Continue coreg and hydralazine per primary team. Monitor BP.  ? ?Subjective:   ?No acute overnight events. Patient denies chest pain, dyspnea, worsening leg swelling or other concerns this morning.   ? ?Objective:   ?BP (!) 152/63 (BP Location: Left Arm)   Pulse (!) 58   Temp 97.8 ?F (36.6 ?C) (Oral)   Resp 18   Ht '5\' 8"'$  (1.727 m)   Wt 83.5 kg Comment: scale b  SpO2 95%   BMI 27.98 kg/m?  ? ?Intake/Output Summary (Last 24 hours) at 01/09/2022 0808 ?Last data filed at 01/09/2022 0710 ?Gross per 24 hour  ?Intake 580 ml  ?Output 4350 ml  ?Net -3770 ml  ? ?Weight change:  ? ?Physical Exam: ?General: Patient laying flat in the bed comfortably, in no acute distress. ?HEENT: no presence of JVD noted  ?CV: RRR, no murmurs or gallops auscultated ?Resp: CTAB, no wheezing, rales or rhonchi noted ?GI: soft, nontender, nondistended, presence of bowel sounds ?Ext: no LE pitting edema noted bilaterally, distal pulses strong and equal bilaterally  ?Psych: mood appropriate, very pleasant  ? ?  Imaging: ?DG Chest Port 1 View ? ?Result Date: 01/07/2022 ?CLINICAL DATA:  Chest pain for 2 weeks. EXAM: PORTABLE CHEST 1 VIEW COMPARISON:  04/29/2021 FINDINGS: The cardiac silhouette, mediastinal and hilar contours are within normal limits given the AP projection and portable technique. Bibasilar airspace opacity suggesting bilateral pulmonary infiltrates. There are also small bilateral pleural effusions. No findings for pulmonary edema or pulmonary lesions. The bony thorax is intact. IMPRESSION: Bibasilar pulmonary infiltrates and small bilateral pleural effusions. Electronically Signed   By: Marijo Sanes M.D.   On:  01/07/2022 15:17  ? ?ECHOCARDIOGRAM COMPLETE ? ?Result Date: 01/08/2022 ?   ECHOCARDIOGRAM REPORT   Patient Name:   Jason Beltran Date of Exam: 01/08/2022 Medical Rec #:  [id number]    Height:       68.0 in Accession #:    [id number]   Weight:       184.0 lb Date of Birth:  Dec 16, 1940    BSA:          1.973 m? Patient Age:    61 years     BP:           119/65 mmHg Patient Gender: M            HR:           72 bpm. Exam Location:  Inpatient Procedure: 2D Echo, Cardiac Doppler and Color Doppler Indications:    CHF  History:        Patient has prior history of Echocardiogram examinations, most                 recent 07/24/2021. CHF; Risk Factors:Hypertension and Diabetes.  Sonographer:    Jefferey Pica Referring Phys: [id number] [place]  1. Mild anterolateral and posterolateral hypokinesis. Left ventricular ejection fraction, by estimation, is 40 to 45%. The left ventricle has mildly decreased function. The left ventricle demonstrates regional wall motion abnormalities (see scoring diagram/findings for description). Left ventricular diastolic parameters are consistent with Grade I diastolic dysfunction (impaired relaxation).  2. Right ventricular systolic function is normal. The right ventricular size is normal. There is normal pulmonary artery systolic pressure.  3. Left atrial size was severely dilated.  4. Right atrial size was mildly dilated.  5. The mitral valve is normal in structure. Trivial mitral valve regurgitation. No evidence of mitral stenosis.  6. The aortic valve is tricuspid. There is mild calcification of the aortic valve. Aortic valve regurgitation is not visualized. No aortic stenosis is present.  7. The inferior vena cava is normal in size with greater than 50% respiratory variability, suggesting right atrial pressure of 3 mmHg. FINDINGS  Left Ventricle: Mild anterolateral and posterolateral hypokinesis. Left ventricular ejection fraction, by estimation, is 40 to 45%. The left ventricle  has mildly decreased function. The left ventricle demonstrates regional wall motion abnormalities. The  left ventricular internal cavity size was normal in size. There is no left ventricular hypertrophy. Left ventricular diastolic parameters are consistent with Grade I diastolic dysfunction (impaired relaxation). Indeterminate filling pressures. Right Ventricle: The right ventricular size is normal. No increase in right ventricular wall thickness. Right ventricular systolic function is normal. There is normal pulmonary artery systolic pressure. The tricuspid regurgitant velocity is 2.00 m/s, and  with an assumed right atrial pressure of 3 mmHg, the estimated right ventricular systolic pressure is 56.3 mmHg. Left Atrium: Left atrial size was severely dilated. Right Atrium: Right atrial size was mildly dilated. Pericardium: There is no evidence of pericardial effusion. Mitral Valve: The  mitral valve is normal in structure. Trivial mitral valve regurgitation. No evidence of mitral valve stenosis. Tricuspid Valve: The tricuspid valve is normal in structure. Tricuspid valve regurgitation is trivial. No evidence of tricuspid stenosis. Aortic Valve: The aortic valve is tricuspid. There is mild calcification of the aortic valve. Aortic valve regurgitation is not visualized. No aortic stenosis is present. Aortic valve peak gradient measures 18.2 mmHg. Pulmonic Valve: The pulmonic valve was normal in structure. Pulmonic valve regurgitation is not visualized. No evidence of pulmonic stenosis. Aorta: The aortic root is normal in size and structure. Venous: The inferior vena cava is normal in size with greater than 50% respiratory variability, suggesting right atrial pressure of 3 mmHg. IAS/Shunts: No atrial level shunt detected by color flow Doppler.  LEFT VENTRICLE PLAX 2D LVIDd:         5.20 cm      Diastology LVIDs:         4.40 cm      LV e' medial:    7.07 cm/s LV PW:         1.10 cm      LV E/e' medial:  12.2 LV IVS:         1.00 cm      LV e' lateral:   8.38 cm/s LVOT diam:     2.10 cm      LV E/e' lateral: 10.3 LV SV:         88 LV SV Index:   45 LVOT Area:     3.46 cm?  LV Volumes (MOD) LV vol d, MOD A4C: 181.0 ml LV vol s, MOD A4C: 1

## 2022-01-09 NOTE — TOC Progression Note (Signed)
Transition of Care (TOC) - Progression Note  ? ? ?Patient Details  ?Name: Jason Beltran ?MRN: [id number] ?Date of Birth: 1940-12-20 ? ?Transition of Care (TOC) CM/SW Contact  ?Zenon Mayo, RN ?Phone Number: ?01/09/2022, 5:28 PM ? ?Clinical Narrative:    ?from home, still diuresing, for poss dc tomorrow, ckd 4, may change lasix to po tomorrow. TOC will continue to follow for dc needs.  ? ? ?  ?  ? ?Expected Discharge Plan and Services ?  ?  ?  ?  ?  ?                ?  ?  ?  ?  ?  ?  ?  ?  ?  ?  ? ? ?Social Determinants of Health (SDOH) Interventions ?  ? ?Readmission Risk Interventions ?   ? View : No data to display.  ?  ?  ?  ? ? ?

## 2022-01-09 NOTE — Progress Notes (Signed)
Initial Nutrition Assessment ? ?DOCUMENTATION CODES:  ? ?Not applicable ? ?INTERVENTION:  ?Provide Ensure Enlive po BID, each supplement provides 350 kcal and 20 grams of protein. ? ?Encourage adequate PO intake. ? ?Low sodium diet education given and discussed.  ? ?NUTRITION DIAGNOSIS:  ? ?Increased nutrient needs related to chronic illness (CHF) as evidenced by estimated needs. ? ?GOAL:  ? ?Patient will meet greater than or equal to 90% of their needs ? ?MONITOR:  ? ?PO intake, Supplement acceptance, Labs, Weight trends, Skin, I & O's ? ?REASON FOR ASSESSMENT:  ? ?Consult ?Diet education ? ?ASSESSMENT:  ? ?81 y.o. male with medical history significant of chronic diastolic heart failure, CKD stage IV, hypertension, diet-controlled diabetes, peripheral neuropathy with chronic leg swelling presents with worsening shortness of breath, chest tightness associated with exertion worse for 2 weeks. Pt with CHF exacerbation. ? ?Meal completion has been 50-100% with 50% at breakfast this morning. Wife at bedside. Pt requesting rest and sleep during time of visit. Wife reports pt was eating well prior to admission, however did state that they ate out most days  and would eat restaurant foods/fast foods such as hamburgers. RD provided "Low Sodium Nutrition Therapy" handout from the [employer]. Reviewed patient's dietary recall provide via wife. Provided examples on ways to decrease sodium intake in diet. Discouraged intake of processed foods and use of salt shaker. Encouraged fresh fruits and vegetables as well as whole grain sources of carbohydrates to maximize fiber intake. RD discussed why it is important for patient to adhere to diet recommendations, and emphasized the role of fluids, foods to avoid. Wife reports understanding of information discussed. RD to additionally order Ensure to aid in caloric and protein needs. ? ?Unable to complete Nutrition-Focused physical exam at this time.  ?Pt  requesting rest at time of visit.  ? ?Labs and medications reviewed.  ? ?Diet Order:   ?Diet Order   ? ?       ?  Diet Heart Room service appropriate? Yes; Fluid consistency: Thin; Fluid restriction: 1800 mL Fluid  Diet effective now       ?  ? ?  ?  ? ?  ? ? ?EDUCATION NEEDS:  ? ?Education needs have been addressed ? ?Skin:  Skin Assessment: Reviewed RN Assessment ? ?Last BM:  4/26 ? ?Height:  ? ?Ht Readings from Last 1 Encounters:  ?01/07/22 '5\' 8"'$  (1.727 m)  ? ? ?Weight:  ? ?Wt Readings from Last 1 Encounters:  ?01/09/22 80.3 kg  ? ? ?BMI:  Body mass index is 26.92 kg/m?. ? ?Estimated Nutritional Needs:  ? ?Kcal:  1850-2050 ? ?Protein:  85-100 grams ? ?Fluid:  1.8 L/day ? ?Corrin Parker, MS, RD, LDN ?RD pager number/after hours weekend pager number on Amion. ? ?

## 2022-01-09 NOTE — Progress Notes (Addendum)
?PROGRESS NOTE ? ? ? ?Jason Beltran  HER:[id number] DOB: [birth year] DOA: 01/07/2022 ?PCP: Angelina Sheriff, MD  ? 81 y.o. male with medical history significant of chronic diastolic heart failure, CKD stage IV, hypertension, diet-controlled diabetes, peripheral neuropathy with chronic leg swelling presenting to the emergency room with worsening shortness of breath, chest tightness associated with exertion worse for 2 weeks. ?-Seen in cardiology clinic and sent to the ED, in the emergency room noted to be edematous, chest x-ray noted pulmonary vascular congestion, creatinine was 3.3 ?-improving on IV lasix, ECHo w/ mild drop in EF to 40-45% with WMA, mod to severe AS ? ? ?Assessment and Plan: ? ?Acute systolic and diastolic CHF ?Essential hypertension ?Mild AS ?-Diuresing well on IV lasix, appears close to Euvolemic ?-Nephrology consulting, GDMT limited by CKD ?-Changed to oral torsemide today ?-Repeat echo noted EF down to 40-45%, mod AS, called and d/w Cards on call, not appropriate for Ischemic eval at this time, FU with Dr.Munley, once he starts HD, will be appropriate for ischemic and TAVR workup ? ?CKD 4 ?-Creatinine stable from baseline, per Dr. Joya Gaskins note was 3.4 on 11/26/2021 ?-slight bump in creat to 3.8, nephrology following, see discussion above ? ?Type 2 diabetes mellitus ?-CBGs are stable, HbA1c is 5.2 ? ?Normocytic anemia ?-Likely from CKD, stable ? ?DVT prophylaxis: Heparin subcutaneous  ?code Status: Full code ?Family Communication: Discussed with patient and family at bedside yesterday ?Disposition Plan: Home tomorrow ? ?Consultants:  ?Nephrology ? ?Procedures:  ? ?Antimicrobials:  ? ? ?Objective: ?Vitals:  ? 01/08/22 2020 01/09/22 0820 01/09/22 1119 01/09/22 1140  ?BP: (!) 152/63 (!) 129/96  128/67  ?Pulse: (!) 58 65  60  ?Resp: '18 18  17  '$ ?Temp: 97.8 ?F (36.6 ?C) 98 ?F (36.7 ?C)  98.1 ?F (36.7 ?C)  ?TempSrc: Oral Oral  Oral  ?SpO2: 95% 95%  95%  ?Weight:   80.3 kg   ?Height:       ? ? ?Intake/Output Summary (Last 24 hours) at 01/09/2022 1350 ?Last data filed at 01/09/2022 1315 ?Gross per 24 hour  ?Intake 580 ml  ?Output 3600 ml  ?Net -3020 ml  ? ?Filed Weights  ? 01/07/22 1335 01/08/22 0520 01/09/22 1119  ?Weight: 91.1 kg 83.5 kg 80.3 kg  ? ? ?Examination: ? ?General exam: Elderly pleasant male sitting up in bed, AAOx3, no distress ?HEENT: No JVD ?CVS: S1-S2, regular rhythm ?Lungs: Few basilar rales otherwise clear ?Abdomen: Soft, nontender, bowel sounds present ?Extremities: Trace edema  ?Skin: No rashes ?Psychiatry:  Mood & affect appropriate.  ? ? ? ?Data Reviewed:  ? ?CBC: ?Recent Labs  ?Lab 01/07/22 ?1346 01/09/22 ?0345  ?WBC 5.7 4.9  ?HGB 11.6* 11.5*  ?HCT 35.3* 33.3*  ?MCV 91.2 87.4  ?PLT 131* 129*  ? ?Basic Metabolic Panel: ?Recent Labs  ?Lab 01/07/22 ?1346 01/08/22 ?0233 01/09/22 ?0345  ?NA 139 140 141  ?K 4.6 4.1 3.8  ?CL 108 110 107  ?CO2 '24 22 25  '$ ?GLUCOSE 94 95 118*  ?BUN 39* 42* 43*  ?CREATININE 3.35* 3.64* 3.81*  ?CALCIUM 9.2 9.0 8.8*  ? ?GFR: ?Estimated Creatinine Clearance: 14.2 mL/min (A) (by C-G formula based on SCr of 3.81 mg/dL (H)). ?Liver Function Tests: ?No results for input(s): AST, ALT, ALKPHOS, BILITOT, PROT, ALBUMIN in the last 168 hours. ?No results for input(s): LIPASE, AMYLASE in the last 168 hours. ?No results for input(s): AMMONIA in the last 168 hours. ?Coagulation Profile: ?No results for input(s): INR, PROTIME in the last 168 hours. ?  Cardiac Enzymes: ?No results for input(s): CKTOTAL, CKMB, CKMBINDEX, TROPONINI in the last 168 hours. ?BNP (last 3 results) ?No results for input(s): PROBNP in the last 8760 hours. ?HbA1C: ?Recent Labs  ?  01/08/22 ?0233  ?HGBA1C 5.2  ? ?CBG: ?No results for input(s): GLUCAP in the last 168 hours. ?Lipid Profile: ?No results for input(s): CHOL, HDL, LDLCALC, TRIG, CHOLHDL, LDLDIRECT in the last 72 hours. ?Thyroid Function Tests: ?No results for input(s): TSH, T4TOTAL, FREET4, T3FREE, THYROIDAB in the last 72 hours. ?Anemia  Panel: ?No results for input(s): VITAMINB12, FOLATE, FERRITIN, TIBC, IRON, RETICCTPCT in the last 72 hours. ?Urine analysis: ?No results found for: COLORURINE, APPEARANCEUR, [place], [place], [place], [place], [place], KETONESUR, PROTEINUR, [place], NITRITE, LEUKOCYTESUR ?Sepsis Labs: ?'@LABRCNTIP'$ (procalcitonin:4,lacticidven:4) ? ?)No results found for this or any previous visit (from the past 240 hour(s)).  ? ?Radiology Studies: ?DG Chest Port 1 View ? ?Result Date: 01/07/2022 ?CLINICAL DATA:  Chest pain for 2 weeks. EXAM: PORTABLE CHEST 1 VIEW COMPARISON:  04/29/2021 FINDINGS: The cardiac silhouette, mediastinal and hilar contours are within normal limits given the AP projection and portable technique. Bibasilar airspace opacity suggesting bilateral pulmonary infiltrates. There are also small bilateral pleural effusions. No findings for pulmonary edema or pulmonary lesions. The bony thorax is intact. IMPRESSION: Bibasilar pulmonary infiltrates and small bilateral pleural effusions. Electronically Signed   By: Marijo Sanes M.D.   On: 01/07/2022 15:17  ? ?ECHOCARDIOGRAM COMPLETE ? ?Result Date: 01/08/2022 ?   ECHOCARDIOGRAM REPORT   Patient Name:   Jason Beltran Date of Exam: 01/08/2022 Medical Rec #:  [id number]    Height:       68.0 in Accession #:    [id number]   Weight:       184.0 lb Date of Birth:  1941/02/11    BSA:          1.973 m? Patient Age:    37 years     BP:           119/65 mmHg Patient Gender: M            HR:           72 bpm. Exam Location:  Inpatient Procedure: 2D Echo, Cardiac Doppler and Color Doppler Indications:    CHF  History:        Patient has prior history of Echocardiogram examinations, most                 recent 07/24/2021. CHF; Risk Factors:Hypertension and Diabetes.  Sonographer:    Jefferey Pica Referring Phys: [id number] [place]  1. Mild anterolateral and posterolateral hypokinesis. Left ventricular ejection fraction, by estimation, is 40 to 45%. The left  ventricle has mildly decreased function. The left ventricle demonstrates regional wall motion abnormalities (see scoring diagram/findings for description). Left ventricular diastolic parameters are consistent with Grade I diastolic dysfunction (impaired relaxation).  2. Right ventricular systolic function is normal. The right ventricular size is normal. There is normal pulmonary artery systolic pressure.  3. Left atrial size was severely dilated.  4. Right atrial size was mildly dilated.  5. The mitral valve is normal in structure. Trivial mitral valve regurgitation. No evidence of mitral stenosis.  6. The aortic valve is tricuspid. There is mild calcification of the aortic valve. Aortic valve regurgitation is not visualized. No aortic stenosis is present.  7. The inferior vena cava is normal in size with greater than 50% respiratory variability, suggesting right atrial pressure of 3 mmHg. FINDINGS  Left Ventricle: Mild anterolateral  and posterolateral hypokinesis. Left ventricular ejection fraction, by estimation, is 40 to 45%. The left ventricle has mildly decreased function. The left ventricle demonstrates regional wall motion abnormalities. The  left ventricular internal cavity size was normal in size. There is no left ventricular hypertrophy. Left ventricular diastolic parameters are consistent with Grade I diastolic dysfunction (impaired relaxation). Indeterminate filling pressures. Right Ventricle: The right ventricular size is normal. No increase in right ventricular wall thickness. Right ventricular systolic function is normal. There is normal pulmonary artery systolic pressure. The tricuspid regurgitant velocity is 2.00 m/s, and  with an assumed right atrial pressure of 3 mmHg, the estimated right ventricular systolic pressure is 87.6 mmHg. Left Atrium: Left atrial size was severely dilated. Right Atrium: Right atrial size was mildly dilated. Pericardium: There is no evidence of pericardial effusion.  Mitral Valve: The mitral valve is normal in structure. Trivial mitral valve regurgitation. No evidence of mitral valve stenosis. Tricuspid Valve: The tricuspid valve is normal in structure. Tricuspid valve regurgitation is trivial. N

## 2022-01-10 ENCOUNTER — Other Ambulatory Visit (HOSPITAL_COMMUNITY): Payer: Self-pay

## 2022-01-10 DIAGNOSIS — N184 Chronic kidney disease, stage 4 (severe): Secondary | ICD-10-CM | POA: Diagnosis not present

## 2022-01-10 DIAGNOSIS — I509 Heart failure, unspecified: Secondary | ICD-10-CM | POA: Diagnosis not present

## 2022-01-10 LAB — BASIC METABOLIC PANEL
Anion gap: 10 (ref 5–15)
BUN: 48 mg/dL — ABNORMAL HIGH (ref 8–23)
CO2: 20 mmol/L — ABNORMAL LOW (ref 22–32)
Calcium: 8.5 mg/dL — ABNORMAL LOW (ref 8.9–10.3)
Chloride: 108 mmol/L (ref 98–111)
Creatinine, Ser: 3.77 mg/dL — ABNORMAL HIGH (ref 0.61–1.24)
GFR, Estimated: 15 mL/min — ABNORMAL LOW (ref >60)
Glucose, Bld: 112 mg/dL — ABNORMAL HIGH (ref 70–99)
Potassium: 3.9 mmol/L (ref 3.5–5.1)
Sodium: 138 mmol/L (ref 135–145)

## 2022-01-10 MED ORDER — GABAPENTIN 400 MG PO CAPS
400.0000 mg | ORAL_CAPSULE | Freq: Every day | ORAL | Status: DC
Start: 2022-01-10 — End: 2024-01-29

## 2022-01-10 MED ORDER — TORSEMIDE 20 MG PO TABS
40.0000 mg | ORAL_TABLET | Freq: Every day | ORAL | 0 refills | Status: DC
  Filled 2022-01-10: qty 60, 30d supply, fill #0

## 2022-01-10 NOTE — Progress Notes (Signed)
?[facility] ?Progress Note  ? ?Patient is a 81 y.o. male presents with dyspnea and leg swelling. He has a past medical history of hypertension, Type 2 DM, HLD, CKD stage 4 and diastolic heart failure. Admitted for acute respiratory distress likely secondary to heart failure exacerbation. ? ?Assessment/ Plan:   ?AKI in the setting of CKD stage 4: Cr 3.64>3.81>3.77 with GFR 15 and BUN 49 this morning compared to 3.35 on admission. Improvement noted. Baseline Cr appears to be around 3. Maintains routine outpatient nephrology follow up previously with Dr. Justin Mend and now Dr. Osborne Casco. Etiology possibly prerenal in the setting of chronic hypertension along with recent diuretic administration. Cr 3.1 recently, seems to be an acute change likely secondary to lasix use. Renal ultrasound in Dec 2021 notable for increased echogenicity within the renal parenchyma with diffuse cortical thinning without hydronephrosis or other acute abnormality with simple bilateral renal cysts. No need for emergency dialysis at this time. Output 1.78L over the past 24 hours, down 7.3L since admission. Improving to stable Cr in spite of diuresis and now approached euvolemic.  ?            -continue toresmide 40 mg daily  ?            -monitor Cr trend ?            -monitor I/Os and daily weights ?            -avoid nephrotoxic meds as able ?            -renally dose meds if GFR<15 ?            -Na diet restriction  ? -close follow up with nephrology outpatient  ?Diastolic Heart Failure : BNP 1019 on admission, most recent echo in Nov 2022 demonstrated EF 55-60% consistent with Grade 2 diastolic dysfunction with mild mitral valve regurgitation and mild aortic valve stenosis with moderate aortic valve calcification. Does not appear to be on home diuresis. Echo from 4/26 demonstrated EF 40-45 % with mild anterolateral and posterolateral hypokinesis.  S/p lasix 60 mg on 4/25 then transitioned to IV lasix 40 mg bid. Approached euvolemic  status and on oral diuresis which he will likely continue at discharge.  ?Type 2 DM: Recent A1c 5.2. Monitor glucose per primary team.  ?Normocytic anemia: Stable. Most recently Hgb 11.5 with MCV 87.4. Likely secondary to anemia of chronic diease. Continue to monitor per primary team. ?Hypertension: Home med amlodipine held given edema. Continue coreg and hydralazine per primary team. Monitor BP.  ? ?Subjective:   ?No acute overnight events. Patient denies chest pain, dyspnea or other concerns. He is looking forward to going home soon.   ? ?Objective:   ?BP (!) 154/95 (BP Location: Left Arm)   Pulse 69   Temp 97.7 ?F (36.5 ?C) (Oral)   Resp 18   Ht '5\' 8"'$  (1.727 m)   Wt 79.4 kg   SpO2 96%   BMI 26.62 kg/m?  ? ?Intake/Output Summary (Last 24 hours) at 01/10/2022 0749 ?Last data filed at 01/09/2022 2115 ?Gross per 24 hour  ?Intake 660 ml  ?Output 1475 ml  ?Net -815 ml  ? ?Weight change:  ? ?Physical Exam: ?General: Patient laying comfortably in bed, in no acute distress. ?CV: RRR, no murmurs or gallops auscultated ?Resp: CTAB, no wheezing, rales or rhonchi noted ?GI: soft, nontender, nondistended, presence of bowel sounds ?Ext: distal pulses strong and equal bilaterally, no LE edema noted bilaterally ?Psych: mood appropriate  ? ?  Imaging: ?ECHOCARDIOGRAM COMPLETE ? ?Result Date: 01/08/2022 ?   ECHOCARDIOGRAM REPORT   Patient Name:   Jason Beltran Date of Exam: 01/08/2022 Medical Rec #:  [id number]    Height:       68.0 in Accession #:    [id number]   Weight:       184.0 lb Date of Birth:  12-22-1940    BSA:          1.973 m? Patient Age:    32 years     BP:           119/65 mmHg Patient Gender: M            HR:           72 bpm. Exam Location:  Inpatient Procedure: 2D Echo, Cardiac Doppler and Color Doppler Indications:    CHF  History:        Patient has prior history of Echocardiogram examinations, most                 recent 07/24/2021. CHF; Risk Factors:Hypertension and Diabetes.  Sonographer:    Jefferey Pica  Referring Phys: [id number] [place]  1. Mild anterolateral and posterolateral hypokinesis. Left ventricular ejection fraction, by estimation, is 40 to 45%. The left ventricle has mildly decreased function. The left ventricle demonstrates regional wall motion abnormalities (see scoring diagram/findings for description). Left ventricular diastolic parameters are consistent with Grade I diastolic dysfunction (impaired relaxation).  2. Right ventricular systolic function is normal. The right ventricular size is normal. There is normal pulmonary artery systolic pressure.  3. Left atrial size was severely dilated.  4. Right atrial size was mildly dilated.  5. The mitral valve is normal in structure. Trivial mitral valve regurgitation. No evidence of mitral stenosis.  6. The aortic valve is tricuspid. There is mild calcification of the aortic valve. Aortic valve regurgitation is not visualized. No aortic stenosis is present.  7. The inferior vena cava is normal in size with greater than 50% respiratory variability, suggesting right atrial pressure of 3 mmHg. FINDINGS  Left Ventricle: Mild anterolateral and posterolateral hypokinesis. Left ventricular ejection fraction, by estimation, is 40 to 45%. The left ventricle has mildly decreased function. The left ventricle demonstrates regional wall motion abnormalities. The  left ventricular internal cavity size was normal in size. There is no left ventricular hypertrophy. Left ventricular diastolic parameters are consistent with Grade I diastolic dysfunction (impaired relaxation). Indeterminate filling pressures. Right Ventricle: The right ventricular size is normal. No increase in right ventricular wall thickness. Right ventricular systolic function is normal. There is normal pulmonary artery systolic pressure. The tricuspid regurgitant velocity is 2.00 m/s, and  with an assumed right atrial pressure of 3 mmHg, the estimated right ventricular systolic pressure is  77.8 mmHg. Left Atrium: Left atrial size was severely dilated. Right Atrium: Right atrial size was mildly dilated. Pericardium: There is no evidence of pericardial effusion. Mitral Valve: The mitral valve is normal in structure. Trivial mitral valve regurgitation. No evidence of mitral valve stenosis. Tricuspid Valve: The tricuspid valve is normal in structure. Tricuspid valve regurgitation is trivial. No evidence of tricuspid stenosis. Aortic Valve: The aortic valve is tricuspid. There is mild calcification of the aortic valve. Aortic valve regurgitation is not visualized. No aortic stenosis is present. Aortic valve peak gradient measures 18.2 mmHg. Pulmonic Valve: The pulmonic valve was normal in structure. Pulmonic valve regurgitation is not visualized. No evidence of pulmonic stenosis. Aorta: The aortic root is normal in size  and structure. Venous: The inferior vena cava is normal in size with greater than 50% respiratory variability, suggesting right atrial pressure of 3 mmHg. IAS/Shunts: No atrial level shunt detected by color flow Doppler.  LEFT VENTRICLE PLAX 2D LVIDd:         5.20 cm      Diastology LVIDs:         4.40 cm      LV e' medial:    7.07 cm/s LV PW:         1.10 cm      LV E/e' medial:  12.2 LV IVS:        1.00 cm      LV e' lateral:   8.38 cm/s LVOT diam:     2.10 cm      LV E/e' lateral: 10.3 LV SV:         88 LV SV Index:   45 LVOT Area:     3.46 cm?  LV Volumes (MOD) LV vol d, MOD A4C: 181.0 ml LV vol s, MOD A4C: 111.0 ml LV SV MOD A4C:     181.0 ml RIGHT VENTRICLE             IVC RV Basal diam:  3.00 cm     IVC diam: 1.70 cm RV S prime:     14.00 cm/s TAPSE (M-mode): 2.9 cm LEFT ATRIUM             Index        RIGHT ATRIUM           Index LA diam:        4.20 cm 2.13 cm/m?   RA Area:     19.80 cm? LA Vol (A2C):   88.8 ml 45.02 ml/m?  RA Volume:   62.00 ml  31.43 ml/m? LA Vol (A4C):   75.5 ml 38.27 ml/m? LA Biplane Vol: 82.2 ml 41.67 ml/m?  AORTIC VALVE                 PULMONIC VALVE AV Area  (Vmax): 2.03 cm?     PV Vmax:       0.96 m/s AV Vmax:        213.50 cm/s  PV Peak grad:  3.7 mmHg AV Peak Grad:   18.2 mmHg LVOT Vmax:      125.00 cm/s LVOT Vmean:     72.400 cm/s LVOT VTI:       0.254 m  AORTA

## 2022-01-10 NOTE — Discharge Summary (Signed)
Physician Discharge Summary  ?Joandry Slagter UEK:[id number] DOB: 17-Feb-1941 DOA: 01/07/2022 ? ?PCP: Angelina Sheriff, MD ? ?Admit date: 01/07/2022 ?Discharge date: 01/10/2022 ? ?Time spent: 35 minutes ? ?Recommendations for Outpatient Follow-up:  ?Cardiology Dr. Bettina Gavia in 2 weeks ?Nephrology Dr. Osborne Casco in May for dialysis access planning ? ? ?Discharge Diagnoses:  ?Principal Problem: ?Acute on chronic systolic CHF ?CKD 4 ?Volume overload ?Moderate aortic stenosis ?  Essential hypertension ?  CKD (chronic kidney disease) ?  Hypertension ? ? ?Discharge Condition: Stable ? ?Diet recommendation: Low-sodium, heart healthy ? ?Filed Weights  ? 01/08/22 0520 01/09/22 1119 01/10/22 0507  ?Weight: 83.5 kg 80.3 kg 79.4 kg  ? ? ?History of present illness:  ?81 y.o. male with medical history significant of chronic diastolic heart failure, CKD stage IV, hypertension, diet-controlled diabetes, peripheral neuropathy with chronic leg swelling presenting to the emergency room with worsening shortness of breath, chest tightness associated with exertion worse for 2 weeks. ?-Seen in cardiology clinic and sent to the ED, in the emergency room noted to be edematous, chest x-ray noted pulmonary vascular congestion, creatinine was 3.3 ? ?Hospital Course:  ? ?Acute systolic and diastolic CHF ?Essential hypertension ?Mild AS ?-Diuresed well on IV lasix, now appears euvolemic ?-Nephrology consulting, GDMT limited by CKD ?-Switch to torsemide 40 Mg daily at discharge ?-Repeat echo noted EF down to 40-45%, mod AS, called and d/w Cards on call, not appropriate for Ischemic eval at this time, FU with Dr.Munley, once he starts HD, will be appropriate for ischemic and TAVR workup ?-Follow-up with Dr. Osborne Casco, patient has an appointment in May for dialysis access planning ?  ?CKD 4 ?Volume overload ?-Creatinine stable from baseline, per Dr. Joya Gaskins note was 3.4 on 11/26/2021 ?-Creatinine stable in the 3.5-3.8 range, nephrology following, see discussion  above ?  ?Type 2 diabetes mellitus ?-CBGs are stable, HbA1c is 5.2 ?  ?Normocytic anemia ?-Likely from CKD, stable ? ?Discharge Exam: ?Vitals:  ? 01/10/22 0507 01/10/22 0817  ?BP: (!) 154/95 (!) 161/54  ?Pulse: 69 68  ?Resp: 18   ?Temp: 97.7 ?F (36.5 ?C)   ?SpO2: 96%   ? ?General exam: Elderly pleasant male sitting up in bed, AAOx3, no distress ?HEENT: No JVD ?CVS: S1-S2, regular rhythm ?Lungs: Few basilar rales otherwise clear ?Abdomen: Soft, nontender, bowel sounds present ?Extremities: Trace edema  ?Skin: No rashes ?Psychiatry:  Mood & affect appropriate.  ? ?Discharge Instructions ? ? ?Discharge Instructions   ? ? Diet - low sodium heart healthy   Complete by: As directed ?  ? Increase activity slowly   Complete by: As directed ?  ? ?  ? ?Allergies as of 01/10/2022   ? ?   Reactions  ? Actos [pioglitazone] Other (See Comments)  ? Contraindicated by cardiomyopathy  ? Lisinopril Other (See Comments)  ? Antihypertensives and ARB's discouraged by Nephrology  ? Motrin [ibuprofen]   ? Prefers not to take  ? Trilipix [choline Fenofibrate] Other (See Comments)  ? Contributed to elevated LFT's  ? ?  ? ?  ?Medication List  ?  ? ?STOP taking these medications   ? ?amLODipine 5 MG tablet ?Commonly known as: NORVASC ?  ?valACYclovir 1000 MG tablet ?Commonly known as: VALTREX ?  ? ?  ? ?TAKE these medications   ? ?ALOE VERA EX ?Apply 1 application. topically daily. ?  ?ALOE VERA PO ?Take 200 mg by mouth daily. ?  ?aspirin 81 MG EC tablet ?Take 81 mg by mouth every evening. ?  ?Biotin 5000  MCG Tabs ?Take 1 tablet by mouth at bedtime. ?  ?carvedilol 6.25 MG tablet ?Commonly known as: COREG ?Take 6.25 mg by mouth 2 (two) times daily with a meal. ?  ?CoQ10 200 MG Caps ?Take 1 capsule by mouth every evening. ?  ?febuxostat 40 MG tablet ?Commonly known as: ULORIC ?Take 20 mg by mouth daily. ?  ?gabapentin 400 MG capsule ?Commonly known as: NEURONTIN ?Take 1 capsule (400 mg total) by mouth at bedtime. ?What changed: when to take  this ?  ?Glucosamine 1500 Complex Caps ?Take 1 capsule by mouth daily. ?  ?GRAPE SEED ER PO ?Take 1 capsule by mouth daily. ?  ?hydrALAZINE 50 MG tablet ?Commonly known as: APRESOLINE ?Take 50 mg by mouth 3 (three) times daily. ?  ?ipratropium 0.06 % nasal spray ?Commonly known as: ATROVENT ?Place 2 sprays into both nostrils daily as needed for rhinitis. ?  ?multivitamin tablet ?Take 1 tablet by mouth daily. ?  ?QC TUMERIC COMPLEX PO ?Take 1 tablet by mouth in the morning and at bedtime. ?  ?Saw Palmetto [address] [place] ?Take 3 capsules by mouth daily. ?  ?simvastatin 20 MG tablet ?Commonly known as: ZOCOR ?Take 20 mg by mouth every evening. ?  ?torsemide 20 MG tablet ?Commonly known as: DEMADEX ?Take 2 tablets (40 mg total) by mouth daily. ?Start taking on: January 11, 2022 ?  ?Vitamin D3 50 MCG (2000 UT) Tabs ?Take 1 tablet by mouth daily. ?  ? ?  ? ?Allergies  ?Allergen Reactions  ? Actos [Pioglitazone] Other (See Comments)  ?  Contraindicated by cardiomyopathy  ? Lisinopril Other (See Comments)  ?  Antihypertensives and ARB's discouraged by Nephrology  ? Motrin [Ibuprofen]   ?  Prefers not to take  ? Trilipix [Choline Fenofibrate] Other (See Comments)  ?  Contributed to elevated LFT's  ? ? Follow-up Information   ? ? Angelina Sheriff, MD. Daphane Shepherd on 01/14/2022.   ?Specialty: Family Medicine ?Why: '@10'$ :30am ?Contact information: ?[address] ?[place] [address] ?[phone] ? ? ?  ?  ? ?  ?  ? ?  ? ? ? ?The results of significant diagnostics from this hospitalization (including imaging, microbiology, ancillary and laboratory) are listed below for reference.   ? ?Significant Diagnostic Studies: ?DG Chest Port 1 View ? ?Result Date: 01/07/2022 ?CLINICAL DATA:  Chest pain for 2 weeks. EXAM: PORTABLE CHEST 1 VIEW COMPARISON:  04/29/2021 FINDINGS: The cardiac silhouette, mediastinal and hilar contours are within normal limits given the AP projection and portable technique. Bibasilar airspace opacity suggesting bilateral  pulmonary infiltrates. There are also small bilateral pleural effusions. No findings for pulmonary edema or pulmonary lesions. The bony thorax is intact. IMPRESSION: Bibasilar pulmonary infiltrates and small bilateral pleural effusions. Electronically Signed   By: Marijo Sanes M.D.   On: 01/07/2022 15:17  ? ?ECHOCARDIOGRAM COMPLETE ? ?Result Date: 01/08/2022 ?   ECHOCARDIOGRAM REPORT   Patient Name:   BRAXLEY BALANDRAN Date of Exam: 01/08/2022 Medical Rec #:  [id number]    Height:       68.0 in Accession #:    [id number]   Weight:       184.0 lb Date of Birth:  [birth year]    BSA:          1.973 m? Patient Age:    27 years     BP:           119/65 mmHg Patient Gender: M  HR:           72 bpm. Exam Location:  Inpatient Procedure: 2D Echo, Cardiac Doppler and Color Doppler Indications:    CHF  History:        Patient has prior history of Echocardiogram examinations, most                 recent 07/24/2021. CHF; Risk Factors:Hypertension and Diabetes.  Sonographer:    Jefferey Pica Referring Phys: [id number] [place]  1. Mild anterolateral and posterolateral hypokinesis. Left ventricular ejection fraction, by estimation, is 40 to 45%. The left ventricle has mildly decreased function. The left ventricle demonstrates regional wall motion abnormalities (see scoring diagram/findings for description). Left ventricular diastolic parameters are consistent with Grade I diastolic dysfunction (impaired relaxation).  2. Right ventricular systolic function is normal. The right ventricular size is normal. There is normal pulmonary artery systolic pressure.  3. Left atrial size was severely dilated.  4. Right atrial size was mildly dilated.  5. The mitral valve is normal in structure. Trivial mitral valve regurgitation. No evidence of mitral stenosis.  6. The aortic valve is tricuspid. There is mild calcification of the aortic valve. Aortic valve regurgitation is not visualized. No aortic stenosis is present.  7. The  inferior vena cava is normal in size with greater than 50% respiratory variability, suggesting right atrial pressure of 3 mmHg. FINDINGS  Left Ventricle: Mild anterolateral and posterolateral hypokinesi

## 2022-01-10 NOTE — TOC Transition Note (Addendum)
Transition of Care (TOC) - CM/SW Discharge Note ? ? ?Patient Details  ?Name: Jason Beltran ?MRN: [id number] ?Date of Birth: 12-Dec-1940 ? ?Transition of Care (TOC) CM/SW Contact:  ?Zenon Mayo, RN ?Phone Number: ?01/10/2022, 10:24 AM ? ? ?Clinical Narrative:    ?NCM just notified by physical therapy he will need outpatient pt.  Patient states he would like to go to [facility] outpatient [facility], he has apt scheduled for Wed at 10 am . Will need script to fax to  [phone], phone is [phone].  NCM faxed script and pt eval and demographics to [facility]. ? ? ?  ?  ? ? ?Patient Goals and CMS Choice ?  ?  ?  ? ?Discharge Placement ?  ?           ?  ?  ?  ?  ? ?Discharge Plan and Services ?  ?  ?           ?  ?  ?  ?  ?  ?  ?  ?  ?  ?  ? ?Social Determinants of Health (SDOH) Interventions ?  ? ? ?Readmission Risk Interventions ?   ? View : No data to display.  ?  ?  ?  ? ? ? ? ? ?

## 2022-01-10 NOTE — Care Management Important Message (Signed)
Important Message ? ?Patient Details  ?Name: Jason Beltran ?MRN: [id number] ?Date of Birth: 10-26-1940 ? ? ?Medicare Important Message Given:  Yes ? ? ? ? ?Shelda Altes ?01/10/2022, 7:32 AM ?

## 2022-01-10 NOTE — Progress Notes (Signed)
Went over discharge instructions with patient at the bedside. Patient made aware of medication changes and follow up appointments. Patient verbalize understanding. Patient made aware of TOC med. Tele monitor removed and CCMD notified. NT removed PIV and patient tolerated well. Patient is getting dressed after a session of PT. ?

## 2022-01-10 NOTE — Evaluation (Signed)
Physical Therapy Evaluation ?Patient Details ?Name: Jason Beltran ?MRN: [id number] ?DOB: [birth year] ?Today's Date: 01/10/2022 ? ?History of Present Illness ? The pt is an 81 yo male presenting 4/25 with chest pain/heaviness and SOB with exertion. Found to have AKI on CKD IV, and acute respiratory distress due to CHF exacerbation. PMH includes: CKD IV, DM II, HTN, CHF, neuropathy, and obesity. ?  ?Clinical Impression ? Pt in bed upon arrival of PT, agreeable to evaluation at this time. Prior to admission the pt was mobilizing without need for DME or assist, living with his wife in a home with 2 steps to enter. The pt was able to complete short bout of ambulation and all sit-stand transfers without assist or need for DME, but present with deficits in endurance, LE power, and dynamic stability that will be best addressed at OPPT. He is safe for return home with family assist, but will benefit from skilled OPPT to progress endurance and maintain independence with reduced risk of falls at home.  ?   ?   ? ?Recommendations for follow up therapy are one component of a multi-disciplinary discharge planning process, led by the attending physician.  Recommendations may be updated based on patient status, additional functional criteria and insurance authorization. ? ?Follow Up Recommendations Outpatient PT ? ?  ?Assistance Recommended at Discharge PRN  ?Patient can return home with the following ? A little help with walking and/or transfers;A little help with bathing/dressing/bathroom;Assistance with cooking/housework;Help with stairs or ramp for entrance ? ?  ?Equipment Recommendations None recommended by PT  ?Recommendations for Other Services ?    ?  ?Functional Status Assessment Patient has had a recent decline in their functional status and demonstrates the ability to make significant improvements in function in a reasonable and predictable amount of time.  ? ?  ?Precautions / Restrictions Precautions ?Precautions:  None ?Restrictions ?Weight Bearing Restrictions: No  ? ?  ? ?Mobility ? Bed Mobility ?Overal bed mobility: Independent ?  ?  ?  ?  ?  ?  ?  ?  ? ?Transfers ?Overall transfer level: Independent ?Equipment used: None ?Transfers: Sit to/from Stand ?  ?  ?  ?  ?  ?  ?General transfer comment: no assist given, stable with stand ?  ? ?Ambulation/Gait ?Ambulation/Gait assistance: Supervision ?Gait Distance (Feet): 45 Feet ?Assistive device: None ?Gait Pattern/deviations: WFL(Within Functional Limits) ?Gait velocity: decreased ?Gait velocity interpretation: <1.31 ft/sec, indicative of household ambulator ?  ?General Gait Details: no drifting or LOB, but slow but stable ? ? ?  ?  ? ?Balance Overall balance assessment: Mild deficits observed, not formally tested ?  ?  ?  ?  ?  ?  ?  ?  ?  ?  ?  ?  ?  ?  ?  ?  ?  ?  ?   ? ? ? ?Pertinent Vitals/Pain Pain Assessment ?Pain Assessment: No/denies pain  ? ? ?Home Living Family/patient expects to be discharged to:: Private residence ?Living Arrangements: Spouse/significant other ?Available Help at Discharge: Family ?Type of Home: House ?Home Access: Stairs to enter ?Entrance Stairs-Rails: Can reach both ?Entrance Stairs-Number of Steps: 2 ?  ?Home Layout: One level ?Home Equipment: Conservation officer, nature (2 wheels);Cane - single point;Shower seat;Grab bars - toilet;Grab bars - tub/shower ?   ?  ?Prior Function Prior Level of Function : Independent/Modified Independent;Driving ?  ?  ?  ?  ?  ?  ?Mobility Comments: pt states he ambulates without assist, has DME but not using ?  ADLs Comments: pt reports independent, working at [employer] in free time ?  ? ? ?Hand Dominance  ?   ? ?  ?Extremity/Trunk Assessment  ? Upper Extremity Assessment ?Upper Extremity Assessment: Overall WFL for tasks assessed ?  ? ?Lower Extremity Assessment ?Lower Extremity Assessment: Generalized weakness ?  ? ?Cervical / Trunk Assessment ?Cervical / Trunk Assessment: Kyphotic  ?Communication  ? Communication: HOH   ?Cognition Arousal/Alertness: Awake/alert ?Behavior During Therapy: [facility] for tasks assessed/performed ?Overall Cognitive Status: Impaired/Different from baseline ?Area of Impairment: Memory ?  ?  ?  ?  ?  ?  ?  ?  ?  ?  ?Memory: Decreased short-term memory ?  ?  ?  ?  ?General Comments: pt unable to recall information given during session from PT, asking TOC about information covered in session. able to complete all instructions. ?  ?  ? ?  ?General Comments General comments (skin integrity, edema, etc.): VSS on RA, pt SOB with exertion but SpO2 97% ? ?  ?   ? ?Assessment/Plan  ?  ?PT Assessment All further PT needs can be met in the next venue of care  ?PT Problem List Cardiopulmonary status limiting activity;Decreased activity tolerance;Decreased balance ? ?   ?  ?PT Treatment Interventions DME instruction;Gait training;Stair training;Functional mobility training;Therapeutic activities;Therapeutic exercise;Balance training;Patient/family education   ? ?PT Goals (Current goals can be found in the Care Plan section)  ?Acute Rehab PT Goals ?Patient Stated Goal: return home ?PT Goal Formulation: With patient ?Time For Goal Achievement: 01/24/22 ?Potential to Achieve Goals: Good ? ?  ? ?AM-PAC PT "6 Clicks" Mobility  ?Outcome Measure Help needed turning from your back to your side while in a flat bed without using bedrails?: None ?Help needed moving from lying on your back to sitting on the side of a flat bed without using bedrails?: None ?Help needed moving to and from a bed to a chair (including a wheelchair)?: None ?Help needed standing up from a chair using your arms (e.g., wheelchair or bedside chair)?: None ?Help needed to walk in hospital room?: A Little ?Help needed climbing 3-5 steps with a railing? : A Little ?6 Click Score: 22 ? ?  ?End of Session Equipment Utilized During Treatment: Gait belt ?Activity Tolerance: Patient tolerated treatment well ?Patient left: in chair;with call bell/phone within  reach ?Nurse Communication: Mobility status ?PT Visit Diagnosis: Other abnormalities of gait and mobility (R26.89);Muscle weakness (generalized) (M62.81) ?  ? ?Time: 9892-1194 ?PT Time Calculation (min) (ACUTE ONLY): 18 min ? ? ?Charges:   PT Evaluation ?$PT Eval Low Complexity: 1 Low ?  ?  ?   ? ? ?West Carbo, PT, DPT  ? ?Acute Rehabilitation Department ?Pager #: [phone] - [phone] ? ?Sandra Cockayne ?01/10/2022, 10:37 AM ? ?

## 2022-01-14 DIAGNOSIS — I129 Hypertensive chronic kidney disease with stage 1 through stage 4 chronic kidney disease, or unspecified chronic kidney disease: Secondary | ICD-10-CM | POA: Diagnosis not present

## 2022-01-14 DIAGNOSIS — Z6827 Body mass index (BMI) 27.0-27.9, adult: Secondary | ICD-10-CM | POA: Diagnosis not present

## 2022-01-14 DIAGNOSIS — N184 Chronic kidney disease, stage 4 (severe): Secondary | ICD-10-CM | POA: Diagnosis not present

## 2022-01-14 DIAGNOSIS — E1122 Type 2 diabetes mellitus with diabetic chronic kidney disease: Secondary | ICD-10-CM | POA: Diagnosis not present

## 2022-01-14 DIAGNOSIS — N2581 Secondary hyperparathyroidism of renal origin: Secondary | ICD-10-CM | POA: Diagnosis not present

## 2022-01-14 DIAGNOSIS — I5189 Other ill-defined heart diseases: Secondary | ICD-10-CM | POA: Diagnosis not present

## 2022-01-14 DIAGNOSIS — I5023 Acute on chronic systolic (congestive) heart failure: Secondary | ICD-10-CM | POA: Diagnosis not present

## 2022-01-14 DIAGNOSIS — D631 Anemia in chronic kidney disease: Secondary | ICD-10-CM | POA: Diagnosis not present

## 2022-01-14 DIAGNOSIS — I350 Nonrheumatic aortic (valve) stenosis: Secondary | ICD-10-CM | POA: Diagnosis not present

## 2022-01-14 DIAGNOSIS — E872 Acidosis, unspecified: Secondary | ICD-10-CM | POA: Diagnosis not present

## 2022-01-15 DIAGNOSIS — N183 Chronic kidney disease, stage 3 unspecified: Secondary | ICD-10-CM | POA: Diagnosis not present

## 2022-01-15 DIAGNOSIS — D631 Anemia in chronic kidney disease: Secondary | ICD-10-CM | POA: Diagnosis not present

## 2022-01-22 DIAGNOSIS — R2681 Unsteadiness on feet: Secondary | ICD-10-CM | POA: Diagnosis not present

## 2022-01-22 DIAGNOSIS — D631 Anemia in chronic kidney disease: Secondary | ICD-10-CM | POA: Diagnosis not present

## 2022-01-22 DIAGNOSIS — N183 Chronic kidney disease, stage 3 unspecified: Secondary | ICD-10-CM | POA: Diagnosis not present

## 2022-01-22 DIAGNOSIS — M6281 Muscle weakness (generalized): Secondary | ICD-10-CM | POA: Diagnosis not present

## 2022-01-22 DIAGNOSIS — R5383 Other fatigue: Secondary | ICD-10-CM | POA: Diagnosis not present

## 2022-01-30 DIAGNOSIS — D631 Anemia in chronic kidney disease: Secondary | ICD-10-CM | POA: Diagnosis not present

## 2022-01-30 DIAGNOSIS — N183 Chronic kidney disease, stage 3 unspecified: Secondary | ICD-10-CM | POA: Diagnosis not present

## 2022-02-07 DIAGNOSIS — D631 Anemia in chronic kidney disease: Secondary | ICD-10-CM | POA: Diagnosis not present

## 2022-02-07 DIAGNOSIS — M2681 Anterior soft tissue impingement: Secondary | ICD-10-CM | POA: Diagnosis not present

## 2022-02-07 DIAGNOSIS — N183 Chronic kidney disease, stage 3 unspecified: Secondary | ICD-10-CM | POA: Diagnosis not present

## 2022-02-07 DIAGNOSIS — N184 Chronic kidney disease, stage 4 (severe): Secondary | ICD-10-CM | POA: Diagnosis not present

## 2022-02-07 DIAGNOSIS — R5383 Other fatigue: Secondary | ICD-10-CM | POA: Diagnosis not present

## 2022-02-07 DIAGNOSIS — R2681 Unsteadiness on feet: Secondary | ICD-10-CM | POA: Diagnosis not present

## 2022-02-11 DIAGNOSIS — N184 Chronic kidney disease, stage 4 (severe): Secondary | ICD-10-CM | POA: Diagnosis not present

## 2022-02-11 DIAGNOSIS — E872 Acidosis, unspecified: Secondary | ICD-10-CM | POA: Diagnosis not present

## 2022-02-11 DIAGNOSIS — D631 Anemia in chronic kidney disease: Secondary | ICD-10-CM | POA: Diagnosis not present

## 2022-02-11 DIAGNOSIS — E1122 Type 2 diabetes mellitus with diabetic chronic kidney disease: Secondary | ICD-10-CM | POA: Diagnosis not present

## 2022-02-11 DIAGNOSIS — N2581 Secondary hyperparathyroidism of renal origin: Secondary | ICD-10-CM | POA: Diagnosis not present

## 2022-02-11 DIAGNOSIS — R5383 Other fatigue: Secondary | ICD-10-CM | POA: Diagnosis not present

## 2022-02-11 DIAGNOSIS — M6281 Muscle weakness (generalized): Secondary | ICD-10-CM | POA: Diagnosis not present

## 2022-02-11 DIAGNOSIS — R2681 Unsteadiness on feet: Secondary | ICD-10-CM | POA: Diagnosis not present

## 2022-02-11 DIAGNOSIS — I129 Hypertensive chronic kidney disease with stage 1 through stage 4 chronic kidney disease, or unspecified chronic kidney disease: Secondary | ICD-10-CM | POA: Diagnosis not present

## 2022-02-12 DIAGNOSIS — I10 Essential (primary) hypertension: Secondary | ICD-10-CM | POA: Diagnosis not present

## 2022-02-12 DIAGNOSIS — R2681 Unsteadiness on feet: Secondary | ICD-10-CM | POA: Diagnosis not present

## 2022-02-12 DIAGNOSIS — R5383 Other fatigue: Secondary | ICD-10-CM | POA: Diagnosis not present

## 2022-02-12 DIAGNOSIS — N183 Chronic kidney disease, stage 3 unspecified: Secondary | ICD-10-CM | POA: Diagnosis not present

## 2022-02-12 DIAGNOSIS — D631 Anemia in chronic kidney disease: Secondary | ICD-10-CM | POA: Diagnosis not present

## 2022-02-12 DIAGNOSIS — E7800 Pure hypercholesterolemia, unspecified: Secondary | ICD-10-CM | POA: Diagnosis not present

## 2022-02-12 DIAGNOSIS — N189 Chronic kidney disease, unspecified: Secondary | ICD-10-CM | POA: Diagnosis not present

## 2022-02-12 DIAGNOSIS — M6281 Muscle weakness (generalized): Secondary | ICD-10-CM | POA: Diagnosis not present

## 2022-02-18 DIAGNOSIS — R2681 Unsteadiness on feet: Secondary | ICD-10-CM | POA: Diagnosis not present

## 2022-02-18 DIAGNOSIS — N183 Chronic kidney disease, stage 3 unspecified: Secondary | ICD-10-CM | POA: Diagnosis not present

## 2022-02-18 DIAGNOSIS — D631 Anemia in chronic kidney disease: Secondary | ICD-10-CM | POA: Diagnosis not present

## 2022-02-18 DIAGNOSIS — M6281 Muscle weakness (generalized): Secondary | ICD-10-CM | POA: Diagnosis not present

## 2022-02-18 DIAGNOSIS — R5383 Other fatigue: Secondary | ICD-10-CM | POA: Diagnosis not present

## 2022-02-20 DIAGNOSIS — R2681 Unsteadiness on feet: Secondary | ICD-10-CM | POA: Diagnosis not present

## 2022-02-20 DIAGNOSIS — D631 Anemia in chronic kidney disease: Secondary | ICD-10-CM | POA: Diagnosis not present

## 2022-02-20 DIAGNOSIS — R5383 Other fatigue: Secondary | ICD-10-CM | POA: Diagnosis not present

## 2022-02-20 DIAGNOSIS — M6281 Muscle weakness (generalized): Secondary | ICD-10-CM | POA: Diagnosis not present

## 2022-02-20 DIAGNOSIS — N183 Chronic kidney disease, stage 3 unspecified: Secondary | ICD-10-CM | POA: Diagnosis not present

## 2022-02-25 DIAGNOSIS — R2681 Unsteadiness on feet: Secondary | ICD-10-CM | POA: Diagnosis not present

## 2022-02-25 DIAGNOSIS — R5383 Other fatigue: Secondary | ICD-10-CM | POA: Diagnosis not present

## 2022-02-25 DIAGNOSIS — N183 Chronic kidney disease, stage 3 unspecified: Secondary | ICD-10-CM | POA: Diagnosis not present

## 2022-02-25 DIAGNOSIS — D631 Anemia in chronic kidney disease: Secondary | ICD-10-CM | POA: Diagnosis not present

## 2022-02-25 DIAGNOSIS — M6281 Muscle weakness (generalized): Secondary | ICD-10-CM | POA: Diagnosis not present

## 2022-02-27 DIAGNOSIS — N183 Chronic kidney disease, stage 3 unspecified: Secondary | ICD-10-CM | POA: Diagnosis not present

## 2022-02-27 DIAGNOSIS — R5383 Other fatigue: Secondary | ICD-10-CM | POA: Diagnosis not present

## 2022-02-27 DIAGNOSIS — D631 Anemia in chronic kidney disease: Secondary | ICD-10-CM | POA: Diagnosis not present

## 2022-02-27 DIAGNOSIS — M6281 Muscle weakness (generalized): Secondary | ICD-10-CM | POA: Diagnosis not present

## 2022-02-27 DIAGNOSIS — R2681 Unsteadiness on feet: Secondary | ICD-10-CM | POA: Diagnosis not present

## 2022-03-03 ENCOUNTER — Encounter: Payer: Self-pay | Admitting: Cardiology

## 2022-03-03 ENCOUNTER — Ambulatory Visit (INDEPENDENT_AMBULATORY_CARE_PROVIDER_SITE_OTHER): Payer: Medicare Other | Admitting: Cardiology

## 2022-03-03 VITALS — BP 142/54 | HR 48 | Ht 68.0 in | Wt 189.2 lb

## 2022-03-03 DIAGNOSIS — I129 Hypertensive chronic kidney disease with stage 1 through stage 4 chronic kidney disease, or unspecified chronic kidney disease: Secondary | ICD-10-CM

## 2022-03-03 DIAGNOSIS — N184 Chronic kidney disease, stage 4 (severe): Secondary | ICD-10-CM

## 2022-03-03 DIAGNOSIS — R2681 Unsteadiness on feet: Secondary | ICD-10-CM | POA: Diagnosis not present

## 2022-03-03 DIAGNOSIS — D631 Anemia in chronic kidney disease: Secondary | ICD-10-CM

## 2022-03-03 DIAGNOSIS — I5042 Chronic combined systolic (congestive) and diastolic (congestive) heart failure: Secondary | ICD-10-CM | POA: Diagnosis not present

## 2022-03-03 DIAGNOSIS — N185 Chronic kidney disease, stage 5: Secondary | ICD-10-CM

## 2022-03-03 DIAGNOSIS — M6281 Muscle weakness (generalized): Secondary | ICD-10-CM | POA: Diagnosis not present

## 2022-03-03 DIAGNOSIS — I110 Hypertensive heart disease with heart failure: Secondary | ICD-10-CM | POA: Diagnosis not present

## 2022-03-03 DIAGNOSIS — N183 Chronic kidney disease, stage 3 unspecified: Secondary | ICD-10-CM | POA: Diagnosis not present

## 2022-03-03 DIAGNOSIS — R5383 Other fatigue: Secondary | ICD-10-CM | POA: Diagnosis not present

## 2022-03-03 MED ORDER — TORSEMIDE 20 MG PO TABS: 20.0000 mg | ORAL_TABLET | Freq: Two times a day (BID) | ORAL | 3 refills | Status: DC

## 2022-03-03 NOTE — Progress Notes (Unsigned)
Cardiology Office Note:    Date:  03/04/2022   ID:  Jason Beltran, DOB Aug 07, 1941, MRN [id number]  PCP:  Angelina Sheriff, MD  Cardiologist:  Shirlee More, MD    Referring MD: Angelina Sheriff, MD    ASSESSMENT:    1. Hypertensive heart disease with chronic combined systolic and diastolic congestive heart failure ([place])   2. CKD (chronic kidney disease) stage 5, GFR less than 15 ml/min (HCC)   3. Hypertension, renal disease, stage 1-4 or unspecified chronic kidney disease   4. Anemia due to stage 4 chronic kidney disease (HCC)    PLAN:    In order of problems listed above:  Jason Beltran is doing much better after extensive diuresis in the hospital by his home weights somewhere in the range of 17 to 20 pounds however he has been taking his diuretic once a day and his weight is pretty stable few pounds he is edema we will put his torsemide back to twice daily he is following closely infrequently by his nephrologist and they are holding off on placing a fistula.  Recently has begun to see hematology for iron and erythropoietin therapy for his anemia of chronic disease he is pleased with the quality of his life.  BP is at target.  We will continue his current cardiovascular medications including amlodipine and low-dose carvedilol with his disease hydralazine and statin.   Next appointment: 3 months   Medication Adjustments/Labs and Tests Ordered: Current medicines are reviewed at length with the patient today.  Concerns regarding medicines are outlined above.  No orders of the defined types were placed in this encounter.  Meds ordered this encounter  Medications   torsemide (DEMADEX) 20 MG tablet    Sig: Take 1 tablet (20 mg total) by mouth 2 (two) times daily.    Dispense:  180 tablet    Refill:  3    Chief complaint follow-up for heart failure he is planning renal replacement therapy   History of Present Illness:    Jason Beltran is a 81 y.o. male with a hx of CKD unfortunately  progressing to stage V hypertensive heart disease with heart failure combined systolic and diastolic and gout last seen 01/07/2022 with decompensated heart failure and severe volume overloaded directly referred to the hospital for admission and treatment as an inpatient.  He also had chest pain but no indication of acute coronary syndrome with elevated high-sensitivity troponin at 28 and a negative delta of 2.  His cardiac echo showed mildly decreased ejection fraction no aortic valve gradient and no findings of pericardial effusion or Perry carditis.  He was admitted to [facility] 01/10/2022 with decompensated heart failure volume overload in the setting of worsened CKD stage IV.  As an inpatient he underwent extensive diuresis with resolution of his volume overload shortness of breath and edema.  His cardiac echo showed mild LV dysfunction EF 40 to 45% right ventricle normal size and function left atrium severely dilated right atrium mildly dilated and no significant valvular abnormality.  His last BMP 1 month ago showed a creatinine of 3.77 GFR 15 cc transition to stage V CKD with a potassium of 3.9.  He was also anemic hemoglobin 11.5 and single isolated BNP was quite elevated 1019.  His EKG 01/08/2022 showed sinus rhythm first-degree AV block nonspecific ST-T  Compliance with diet, lifestyle and medications: Yes  He continues to feel markedly improved from the hospitalization however he was only taking his diuretic once  a day he has gained a little weight starting to have edema and he has agreed to go back to his prescribed torsemide twice daily he continues to follow with nephrology and he is in a watchful waiting pattern and does not have a fistula performed. He is not having shortness of breath orthopnea chest pain palpitations syncope and he meticulously restrict sodium in his diet Past Medical History:  Diagnosis Date   Anemia of chronic disease    Aortic stenosis 05/08/2018   Very mild  mean 9 mm Hg   Chronic sinusitis    CKD (chronic kidney disease)    Diabetes mellitus type 2, uncontrolled    Diastolic CHF ([place])    Diastolic dysfunction 1/63/8466   Essential hypertension 05/06/2018   Hyperparathyroidism ([place])    Hypertension    Neuropathy, peripheral    Obesity    Type 2 diabetes mellitus ([place]) 05/06/2018    Past Surgical History:  Procedure Laterality Date   ANKLE SURGERY Right 01/20/2012   NASAL SINUS SURGERY     VASECTOMY      Current Medications: Current Meds  Medication Sig   ALOE VERA PO Take 200 mg by mouth daily.   amLODipine (NORVASC) 5 MG tablet Take 5 mg by mouth daily.   aspirin 81 MG EC tablet Take 81 mg by mouth every evening.   Biotin 5000 MCG TABS Take 1 tablet by mouth at bedtime.   carvedilol (COREG) 6.25 MG tablet Take 6.25 mg by mouth 2 (two) times daily with a meal.   Cholecalciferol (VITAMIN D3) 50 MCG (2000 UT) TABS Take 1 tablet by mouth daily.   Coenzyme Q10 (COQ10) 200 MG CAPS Take 1 capsule by mouth every evening.   febuxostat (ULORIC) 40 MG tablet Take 20 mg by mouth daily.   gabapentin (NEURONTIN) 400 MG capsule Take 1 capsule (400 mg total) by mouth at bedtime. (Patient taking differently: Take 400 mg by mouth 2 (two) times daily.)   Glucosamine-Chondroit-Vit C-Mn (GLUCOSAMINE 1500 COMPLEX) CAPS Take 1 capsule by mouth daily.   GRAPE SEED ER PO Take 1 capsule by mouth daily.   hydrALAZINE (APRESOLINE) 50 MG tablet Take 50 mg by mouth 3 (three) times daily.   ipratropium (ATROVENT) 0.06 % nasal spray Place 2 sprays into both nostrils daily as needed for rhinitis.   Multiple Vitamin (MULTIVITAMIN) tablet Take 1 tablet by mouth daily.   Saw Palmetto 450 MG CAPS Take 3 capsules by mouth daily.   simvastatin (ZOCOR) 20 MG tablet Take 20 mg by mouth every evening.   torsemide (DEMADEX) 20 MG tablet Take 1 tablet (20 mg total) by mouth 2 (two) times daily.   Turmeric (QC TUMERIC COMPLEX PO) Take 1 tablet by mouth in the morning and at  bedtime.   [DISCONTINUED] torsemide (DEMADEX) 20 MG tablet Take 2 tablets (40 mg total) by mouth daily.     Allergies:   Actos [pioglitazone], Lisinopril, Motrin [ibuprofen], and Trilipix [choline fenofibrate]   Social History   Socioeconomic History   Marital status: Married    Spouse name: Not on file   Number of children: Not on file   Years of education: Not on file   Highest education level: Not on file  Occupational History   Not on file  Tobacco Use   Smoking status: Never   Smokeless tobacco: Never  Vaping Use   Vaping Use: Never used  Substance and Sexual Activity   Alcohol use: Yes    Comment: occasional glass of wine  Drug use: Never   Sexual activity: Not on file  Other Topics Concern   Not on file  Social History Narrative   Not on file   Social Determinants of Health   Financial Resource Strain: Not on file  Food Insecurity: Not on file  Transportation Needs: Not on file  Physical Activity: Not on file  Stress: Not on file  Social Connections: Not on file     Family History: The patient's family history includes Congestive Heart Failure in his mother; Heart failure in his father; Polymyalgia rheumatica in his mother; Stroke in his paternal aunt and paternal grandmother. ROS:   Please see the history of present illness.    All other systems reviewed and are negative.  EKGs/Labs/Other Studies Reviewed:    The following studies were reviewed today:    Recent Labs: 01/07/2022: B Natriuretic Peptide 1,019.3 01/09/2022: Hemoglobin 11.5; Platelets 129 01/10/2022: BUN 48; Creatinine, Ser 3.77; Potassium 3.9; Sodium 138  Recent Lipid Panel No results found for: "CHOL", "TRIG", "HDL", "CHOLHDL", "VLDL", "LDLCALC", "LDLDIRECT"  Physical Exam:    VS:  BP (!) 142/54 (BP Location: Left Arm, Patient Position: Sitting)   Pulse (!) 48   Ht '5\' 8"'$  (1.727 m)   Wt 189 lb 3.2 oz (85.8 kg)   SpO2 96%   BMI 28.77 kg/m     Wt Readings from Last 3 Encounters:   03/03/22 189 lb 3.2 oz (85.8 kg)  01/10/22 175 lb 1.6 oz (79.4 kg)  01/07/22 200 lb 12.8 oz (91.1 kg)     GEN: He looks markedly improved well nourished, well developed in no acute distress HEENT: Normal NECK: No JVD; No carotid bruits LYMPHATICS: No lymphadenopathy CARDIAC: 1/6 aortic ejection murmur RRR, no murmurs, rubs, gallops RESPIRATORY:  Clear to auscultation without rales, wheezing or rhonchi  ABDOMEN: Soft, non-tender, non-distended MUSCULOSKELETAL: 1-2+ bilateral lower extremity pitting below the knee edema; No deformity  SKIN: Warm and dry NEUROLOGIC:  Alert and oriented x 3 PSYCHIATRIC:  Normal affect    Signed, Shirlee More, MD  03/04/2022 9:07 AM    [place]

## 2022-03-03 NOTE — Patient Instructions (Signed)
Medication Instructions:  Your physician has recommended you make the following change in your medication:   START: Torsemide 20 mg twice daily  *If you need a refill on your cardiac medications before your next appointment, please call your pharmacy*   Lab Work: None If you have labs (blood work) drawn today and your tests are completely normal, you will receive your results only by: [place] (if you have MyChart) OR A paper copy in the mail If you have any lab test that is abnormal or we need to change your treatment, we will call you to review the results.   Testing/Procedures: None   Follow-Up: At [facility], you and your health needs are our priority.  As part of our continuing mission to provide you with exceptional heart care, we have created designated Provider Care Teams.  These Care Teams include your primary Cardiologist (physician) and Advanced Practice Providers (APPs -  Physician Assistants and Nurse Practitioners) who all work together to provide you with the care you need, when you need it.  We recommend signing up for the patient portal called "MyChart".  Sign up information is provided on this After Visit Summary.  MyChart is used to connect with patients for Virtual Visits (Telemedicine).  Patients are able to view lab/test results, encounter notes, upcoming appointments, etc.  Non-urgent messages can be sent to your provider as well.   To learn more about what you can do with MyChart, go to [email or web address].    Your next appointment:   3 month(s)  The format for your next appointment:   In Person  Provider:   Shirlee More, MD    Other Instructions None  Important Information About Sugar

## 2022-03-10 DIAGNOSIS — N189 Chronic kidney disease, unspecified: Secondary | ICD-10-CM | POA: Diagnosis not present

## 2022-03-10 DIAGNOSIS — D631 Anemia in chronic kidney disease: Secondary | ICD-10-CM | POA: Diagnosis not present

## 2022-03-11 DIAGNOSIS — R5383 Other fatigue: Secondary | ICD-10-CM | POA: Diagnosis not present

## 2022-03-11 DIAGNOSIS — N183 Chronic kidney disease, stage 3 unspecified: Secondary | ICD-10-CM | POA: Diagnosis not present

## 2022-03-11 DIAGNOSIS — D631 Anemia in chronic kidney disease: Secondary | ICD-10-CM | POA: Diagnosis not present

## 2022-03-11 DIAGNOSIS — M6281 Muscle weakness (generalized): Secondary | ICD-10-CM | POA: Diagnosis not present

## 2022-03-11 DIAGNOSIS — R2681 Unsteadiness on feet: Secondary | ICD-10-CM | POA: Diagnosis not present

## 2022-03-13 DIAGNOSIS — R5383 Other fatigue: Secondary | ICD-10-CM | POA: Diagnosis not present

## 2022-03-13 DIAGNOSIS — M6281 Muscle weakness (generalized): Secondary | ICD-10-CM | POA: Diagnosis not present

## 2022-03-13 DIAGNOSIS — N183 Chronic kidney disease, stage 3 unspecified: Secondary | ICD-10-CM | POA: Diagnosis not present

## 2022-03-13 DIAGNOSIS — R2681 Unsteadiness on feet: Secondary | ICD-10-CM | POA: Diagnosis not present

## 2022-03-13 DIAGNOSIS — D631 Anemia in chronic kidney disease: Secondary | ICD-10-CM | POA: Diagnosis not present

## 2022-03-17 DIAGNOSIS — R5383 Other fatigue: Secondary | ICD-10-CM | POA: Diagnosis not present

## 2022-03-17 DIAGNOSIS — N183 Chronic kidney disease, stage 3 unspecified: Secondary | ICD-10-CM | POA: Diagnosis not present

## 2022-03-17 DIAGNOSIS — D631 Anemia in chronic kidney disease: Secondary | ICD-10-CM | POA: Diagnosis not present

## 2022-03-17 DIAGNOSIS — R2681 Unsteadiness on feet: Secondary | ICD-10-CM | POA: Diagnosis not present

## 2022-03-17 DIAGNOSIS — M6281 Muscle weakness (generalized): Secondary | ICD-10-CM | POA: Diagnosis not present

## 2022-03-24 DIAGNOSIS — N184 Chronic kidney disease, stage 4 (severe): Secondary | ICD-10-CM | POA: Diagnosis not present

## 2022-03-24 DIAGNOSIS — D631 Anemia in chronic kidney disease: Secondary | ICD-10-CM | POA: Diagnosis not present

## 2022-03-31 DIAGNOSIS — N184 Chronic kidney disease, stage 4 (severe): Secondary | ICD-10-CM | POA: Diagnosis not present

## 2022-03-31 DIAGNOSIS — D631 Anemia in chronic kidney disease: Secondary | ICD-10-CM | POA: Diagnosis not present

## 2022-04-07 DIAGNOSIS — D631 Anemia in chronic kidney disease: Secondary | ICD-10-CM | POA: Diagnosis not present

## 2022-04-07 DIAGNOSIS — N184 Chronic kidney disease, stage 4 (severe): Secondary | ICD-10-CM | POA: Diagnosis not present

## 2022-04-10 DIAGNOSIS — D631 Anemia in chronic kidney disease: Secondary | ICD-10-CM | POA: Diagnosis not present

## 2022-04-10 DIAGNOSIS — E872 Acidosis, unspecified: Secondary | ICD-10-CM | POA: Diagnosis not present

## 2022-04-10 DIAGNOSIS — N2581 Secondary hyperparathyroidism of renal origin: Secondary | ICD-10-CM | POA: Diagnosis not present

## 2022-04-10 DIAGNOSIS — I129 Hypertensive chronic kidney disease with stage 1 through stage 4 chronic kidney disease, or unspecified chronic kidney disease: Secondary | ICD-10-CM | POA: Diagnosis not present

## 2022-04-10 DIAGNOSIS — E1122 Type 2 diabetes mellitus with diabetic chronic kidney disease: Secondary | ICD-10-CM | POA: Diagnosis not present

## 2022-04-10 DIAGNOSIS — I5189 Other ill-defined heart diseases: Secondary | ICD-10-CM | POA: Diagnosis not present

## 2022-04-10 DIAGNOSIS — N184 Chronic kidney disease, stage 4 (severe): Secondary | ICD-10-CM | POA: Diagnosis not present

## 2022-04-14 DIAGNOSIS — N184 Chronic kidney disease, stage 4 (severe): Secondary | ICD-10-CM | POA: Diagnosis not present

## 2022-04-14 DIAGNOSIS — D631 Anemia in chronic kidney disease: Secondary | ICD-10-CM | POA: Diagnosis not present

## 2022-04-28 DIAGNOSIS — N184 Chronic kidney disease, stage 4 (severe): Secondary | ICD-10-CM | POA: Diagnosis not present

## 2022-04-28 DIAGNOSIS — D631 Anemia in chronic kidney disease: Secondary | ICD-10-CM | POA: Diagnosis not present

## 2022-05-12 DIAGNOSIS — N184 Chronic kidney disease, stage 4 (severe): Secondary | ICD-10-CM | POA: Diagnosis not present

## 2022-05-12 DIAGNOSIS — D631 Anemia in chronic kidney disease: Secondary | ICD-10-CM | POA: Diagnosis not present

## 2022-05-14 DIAGNOSIS — E119 Type 2 diabetes mellitus without complications: Secondary | ICD-10-CM | POA: Diagnosis not present

## 2022-05-26 DIAGNOSIS — D631 Anemia in chronic kidney disease: Secondary | ICD-10-CM | POA: Diagnosis not present

## 2022-05-26 DIAGNOSIS — N184 Chronic kidney disease, stage 4 (severe): Secondary | ICD-10-CM | POA: Diagnosis not present

## 2022-05-29 DIAGNOSIS — Z23 Encounter for immunization: Secondary | ICD-10-CM | POA: Diagnosis not present

## 2022-06-06 DIAGNOSIS — N184 Chronic kidney disease, stage 4 (severe): Secondary | ICD-10-CM | POA: Diagnosis not present

## 2022-06-08 NOTE — Progress Notes (Unsigned)
Cardiology Office Note:    Date:  06/10/2022   ID:  Jason Beltran, DOB 1941/08/09, MRN [id number]  PCP:  Angelina Sheriff, MD  Cardiologist:  Shirlee More, MD    Referring MD: Angelina Sheriff, MD    ASSESSMENT:    1. Hypertensive heart disease with chronic combined systolic and diastolic congestive heart failure ([place])   2. CKD (chronic kidney disease) stage 5, GFR less than 15 ml/min (HCC)   3. Anemia due to stage 4 chronic kidney disease ([place])    PLAN:    In order of problems listed above:  He will be seeing his nephrologist tomorrow regarding hypertension they are the primary prescribers and heart failure in the setting of stage V CKD that appears to need renal replacement therapy.  For now he will continue his current diuretic Demadex 20 mg tablet twice daily and his blood pressure is controlled on a combination of amlodipine low-dose carvedilol with his renal insufficiency and hydralazine.  I be very hesitant to increase his diuretics as an outpatient at this time Followed by nephrology   Next appointment: 6 months   Medication Adjustments/Labs and Tests Ordered: Current medicines are reviewed at length with the patient today.  Concerns regarding medicines are outlined above.  No orders of the defined types were placed in this encounter.  No orders of the defined types were placed in this encounter.   Chief Complaint  Patient presents with   Follow-up   Hypertension   Congestive Heart Failure    History of Present Illness:    Jason Beltran is a 81 y.o. male with a hx of CKD progressing to stage V hypertensive heart disease with heart failure combined systolic and diastolic and gout which last seen 03/03/2022 after hospitalization with decompensated heart failure and extensive inpatient diuresis. GFR was 15 cc/min.  His echocardiogram 01/09/2022 showed mildly reduced EF 40 to 45% normal right ventricular size and function both atrium are dilated there is mild  calcification of the aortic valve no stenosis.   1. Mild anterolateral and posterolateral hypokinesis. Left ventricular  ejection fraction, by estimation, is 40 to 45%. The left ventricle has  mildly decreased function. The left ventricle demonstrates regional wall  motion abnormalities (see scoring  diagram/findings for description). Left ventricular diastolic parameters  are consistent with Grade I diastolic dysfunction (impaired relaxation).   2. Right ventricular systolic function is normal. The right ventricular  size is normal. There is normal pulmonary artery systolic pressure.   3. Left atrial size was severely dilated.   4. Right atrial size was mildly dilated.   5. The mitral valve is normal in structure. Trivial mitral valve  regurgitation. No evidence of mitral stenosis.   6. The aortic valve is tricuspid. There is mild calcification of the  aortic valve. Aortic valve regurgitation is not visualized. No aortic  stenosis is present.   7. The inferior vena cava is normal in size with greater than 50%   Compliance with diet, lifestyle and medications: Yes  His weight is up a few pounds unfortunately does not write his weight down but he tells me he is nowhere near this previous weight gain He does note edema he is seeing nephrology tomorrow and had lab work earlier this week for his GFR he tells me previously he was 14 cc and he anticipates he will need a fistula to prepare for renal replacement therapy He is not short of breath no orthopnea chest pain palpitation or syncope  Past Medical History:  Diagnosis Date   Anemia of chronic disease    Aortic stenosis 05/08/2018   Very mild mean 9 mm Hg   Chronic sinusitis    CKD (chronic kidney disease)    Diabetes mellitus type 2, uncontrolled    Diastolic CHF (HCC)    Diastolic dysfunction 6/73/4193   Essential hypertension 05/06/2018   Hyperparathyroidism ([place])    Hypertension    Neuropathy, peripheral    Obesity    Type 2  diabetes mellitus ([place]) 05/06/2018    Past Surgical History:  Procedure Laterality Date   ANKLE SURGERY Right 01/20/2012   NASAL SINUS SURGERY     VASECTOMY      Current Medications: Current Meds  Medication Sig   ALOE VERA PO Take 200 mg by mouth daily.   amLODipine (NORVASC) 5 MG tablet Take 5 mg by mouth daily.   aspirin 81 MG EC tablet Take 81 mg by mouth every evening.   Biotin 5000 MCG TABS Take 1 tablet by mouth at bedtime.   carvedilol (COREG) 6.25 MG tablet Take 6.25 mg by mouth 2 (two) times daily with a meal.   Cholecalciferol (VITAMIN D3) 50 MCG (2000 UT) TABS Take 1 tablet by mouth daily.   Coenzyme Q10 (COQ10) 200 MG CAPS Take 1 capsule by mouth every evening.   febuxostat (ULORIC) 40 MG tablet Take 20 mg by mouth daily.   gabapentin (NEURONTIN) 400 MG capsule Take 1 capsule (400 mg total) by mouth at bedtime. (Patient taking differently: Take 400 mg by mouth 2 (two) times daily.)   Glucosamine-Chondroit-Vit C-Mn (GLUCOSAMINE 1500 COMPLEX) CAPS Take 1 capsule by mouth daily.   GRAPE SEED ER PO Take 1 capsule by mouth daily.   hydrALAZINE (APRESOLINE) 50 MG tablet Take 50 mg by mouth 3 (three) times daily.   ipratropium (ATROVENT) 0.06 % nasal spray Place 2 sprays into both nostrils daily as needed for rhinitis.   Multiple Vitamin (MULTIVITAMIN) tablet Take 1 tablet by mouth daily.   Saw Palmetto 450 MG CAPS Take 3 capsules by mouth daily.   simvastatin (ZOCOR) 20 MG tablet Take 20 mg by mouth every evening.   torsemide (DEMADEX) 20 MG tablet Take 1 tablet (20 mg total) by mouth 2 (two) times daily.   Turmeric (QC TUMERIC COMPLEX PO) Take 1 tablet by mouth in the morning and at bedtime.     Allergies:   Actos [pioglitazone], Lisinopril, Motrin [ibuprofen], and Trilipix [choline fenofibrate]   Social History   Socioeconomic History   Marital status: Married    Spouse name: Not on file   Number of children: Not on file   Years of education: Not on file   Highest  education level: Not on file  Occupational History   Not on file  Tobacco Use   Smoking status: Never   Smokeless tobacco: Never  Vaping Use   Vaping Use: Never used  Substance and Sexual Activity   Alcohol use: Yes    Comment: occasional glass of wine   Drug use: Never   Sexual activity: Not on file  Other Topics Concern   Not on file  Social History Narrative   Not on file   Social Determinants of Health   Financial Resource Strain: Not on file  Food Insecurity: Not on file  Transportation Needs: Not on file  Physical Activity: Not on file  Stress: Not on file  Social Connections: Not on file     Family History: The patient's family history  includes Congestive Heart Failure in his mother; Heart failure in his father; Polymyalgia rheumatica in his mother; Stroke in his paternal aunt and paternal grandmother. ROS:   Please see the history of present illness.    All other systems reviewed and are negative.  EKGs/Labs/Other Studies Reviewed:    The following studies were reviewed today:  Echocardiogram 01/08/2022:  1. Mild anterolateral and posterolateral hypokinesis. Left ventricular  ejection fraction, by estimation, is 40 to 45%. The left ventricle has  mildly decreased function. The left ventricle demonstrates regional wall  motion abnormalities (see scoring  diagram/findings for description). Left ventricular diastolic parameters  are consistent with Grade I diastolic dysfunction (impaired relaxation).   2. Right ventricular systolic function is normal. The right ventricular  size is normal. There is normal pulmonary artery systolic pressure.   3. Left atrial size was severely dilated.   4. Right atrial size was mildly dilated.   5. The mitral valve is normal in structure. Trivial mitral valve  regurgitation. No evidence of mitral stenosis.   6. The aortic valve is tricuspid. There is mild calcification of the  aortic valve. Aortic valve regurgitation is not  visualized. No aortic  stenosis is present.   7. The inferior vena cava is normal in size with greater than 50%  respiratory variability, suggesting right atrial pressure of 3 mmHg.    Recent Labs: 01/07/2022: B Natriuretic Peptide 1,019.3 01/09/2022: Hemoglobin 11.5; Platelets 129 01/10/2022: BUN 48; Creatinine, Ser 3.77; Potassium 3.9; Sodium 138  Recent Lipid Panel 12/16/2021 cholesterol 128 HDL 47 triglycerides 93 04/07/2022 hemoglobin 11.4 creatinine 4.12 potassium 3.9  Physical Exam:    VS:  BP 138/68 (BP Location: Left Arm, Patient Position: Sitting)   Pulse 66   Ht '5\' 8"'$  (1.727 m)   Wt 193 lb 9.6 oz (87.8 kg)   SpO2 97%   BMI 29.44 kg/m     Wt Readings from Last 3 Encounters:  06/10/22 193 lb 9.6 oz (87.8 kg)  03/03/22 189 lb 3.2 oz (85.8 kg)  01/10/22 175 lb 1.6 oz (79.4 kg)     GEN: He appears his age well nourished, well developed in no acute distress HEENT: Normal NECK: No JVD; No carotid bruits LYMPHATICS: No lymphadenopathy CARDIAC: 1-6 flow murmur last echocardiogram did not have aortic stenosis RRR, no murmurs, rubs, gallops RESPIRATORY:  Clear to auscultation without rales, wheezing or rhonchi  ABDOMEN: Soft, non-tender, non-distended MUSCULOSKELETAL:  No edema; No deformity  SKIN: Warm and dry NEUROLOGIC:  Alert and oriented x 3 PSYCHIATRIC:  Normal affect    Signed, Shirlee More, MD  06/10/2022 5:16 PM    [place]

## 2022-06-09 DIAGNOSIS — D631 Anemia in chronic kidney disease: Secondary | ICD-10-CM | POA: Diagnosis not present

## 2022-06-09 DIAGNOSIS — N184 Chronic kidney disease, stage 4 (severe): Secondary | ICD-10-CM | POA: Diagnosis not present

## 2022-06-10 ENCOUNTER — Encounter: Payer: Self-pay | Admitting: Cardiology

## 2022-06-10 ENCOUNTER — Ambulatory Visit: Payer: Medicare Other | Attending: Cardiology | Admitting: Cardiology

## 2022-06-10 VITALS — BP 138/68 | HR 66 | Ht 68.0 in | Wt 193.6 lb

## 2022-06-10 DIAGNOSIS — I5042 Chronic combined systolic (congestive) and diastolic (congestive) heart failure: Secondary | ICD-10-CM | POA: Diagnosis not present

## 2022-06-10 DIAGNOSIS — N185 Chronic kidney disease, stage 5: Secondary | ICD-10-CM | POA: Diagnosis not present

## 2022-06-10 DIAGNOSIS — D631 Anemia in chronic kidney disease: Secondary | ICD-10-CM | POA: Insufficient documentation

## 2022-06-10 DIAGNOSIS — I110 Hypertensive heart disease with heart failure: Secondary | ICD-10-CM | POA: Insufficient documentation

## 2022-06-10 DIAGNOSIS — N184 Chronic kidney disease, stage 4 (severe): Secondary | ICD-10-CM | POA: Insufficient documentation

## 2022-06-10 NOTE — Patient Instructions (Signed)
Medication Instructions:  Your physician recommends that you continue on your current medications as directed. Please refer to the Current Medication list given to you today.  *If you need a refill on your cardiac medications before your next appointment, please call your pharmacy*   Lab Work: None If you have labs (blood work) drawn today and your tests are completely normal, you will receive your results only by: MyChart Message (if you have MyChart) OR A paper copy in the mail If you have any lab test that is abnormal or we need to change your treatment, we will call you to review the results.   Testing/Procedures: None   Follow-Up: At [facility], you and your health needs are our priority.  As part of our continuing mission to provide you with exceptional heart care, we have created designated Provider Care Teams.  These Care Teams include your primary Cardiologist (physician) and Advanced Practice Providers (APPs -  Physician Assistants and Nurse Practitioners) who all work together to provide you with the care you need, when you need it.  We recommend signing up for the patient portal called "MyChart".  Sign up information is provided on this After Visit Summary.  MyChart is used to connect with patients for Virtual Visits (Telemedicine).  Patients are able to view lab/test results, encounter notes, upcoming appointments, etc.  Non-urgent messages can be sent to your provider as well.   To learn more about what you can do with MyChart, go to [email or web address].    Your next appointment:   6 month(s)  The format for your next appointment:   In Person  Provider:   Brian Munley, MD    Other Instructions None  Important Information About Sugar       

## 2022-06-11 DIAGNOSIS — E872 Acidosis, unspecified: Secondary | ICD-10-CM | POA: Diagnosis not present

## 2022-06-11 DIAGNOSIS — L570 Actinic keratosis: Secondary | ICD-10-CM | POA: Diagnosis not present

## 2022-06-11 DIAGNOSIS — L821 Other seborrheic keratosis: Secondary | ICD-10-CM | POA: Diagnosis not present

## 2022-06-11 DIAGNOSIS — L565 Disseminated superficial actinic porokeratosis (DSAP): Secondary | ICD-10-CM | POA: Diagnosis not present

## 2022-06-11 DIAGNOSIS — D631 Anemia in chronic kidney disease: Secondary | ICD-10-CM | POA: Diagnosis not present

## 2022-06-11 DIAGNOSIS — C4442 Squamous cell carcinoma of skin of scalp and neck: Secondary | ICD-10-CM | POA: Diagnosis not present

## 2022-06-11 DIAGNOSIS — D485 Neoplasm of uncertain behavior of skin: Secondary | ICD-10-CM | POA: Diagnosis not present

## 2022-06-11 DIAGNOSIS — Z85828 Personal history of other malignant neoplasm of skin: Secondary | ICD-10-CM | POA: Diagnosis not present

## 2022-06-11 DIAGNOSIS — L812 Freckles: Secondary | ICD-10-CM | POA: Diagnosis not present

## 2022-06-11 DIAGNOSIS — I129 Hypertensive chronic kidney disease with stage 1 through stage 4 chronic kidney disease, or unspecified chronic kidney disease: Secondary | ICD-10-CM | POA: Diagnosis not present

## 2022-06-11 DIAGNOSIS — N184 Chronic kidney disease, stage 4 (severe): Secondary | ICD-10-CM | POA: Diagnosis not present

## 2022-06-11 DIAGNOSIS — N2581 Secondary hyperparathyroidism of renal origin: Secondary | ICD-10-CM | POA: Diagnosis not present

## 2022-06-11 DIAGNOSIS — I5189 Other ill-defined heart diseases: Secondary | ICD-10-CM | POA: Diagnosis not present

## 2022-06-11 DIAGNOSIS — E1122 Type 2 diabetes mellitus with diabetic chronic kidney disease: Secondary | ICD-10-CM | POA: Diagnosis not present

## 2022-06-23 DIAGNOSIS — N184 Chronic kidney disease, stage 4 (severe): Secondary | ICD-10-CM | POA: Diagnosis not present

## 2022-06-23 DIAGNOSIS — D631 Anemia in chronic kidney disease: Secondary | ICD-10-CM | POA: Diagnosis not present

## 2022-07-01 DIAGNOSIS — C44329 Squamous cell carcinoma of skin of other parts of face: Secondary | ICD-10-CM | POA: Diagnosis not present

## 2022-07-01 DIAGNOSIS — Z85828 Personal history of other malignant neoplasm of skin: Secondary | ICD-10-CM | POA: Diagnosis not present

## 2022-07-07 DIAGNOSIS — N184 Chronic kidney disease, stage 4 (severe): Secondary | ICD-10-CM | POA: Diagnosis not present

## 2022-07-07 DIAGNOSIS — D631 Anemia in chronic kidney disease: Secondary | ICD-10-CM | POA: Diagnosis not present

## 2022-07-21 DIAGNOSIS — D631 Anemia in chronic kidney disease: Secondary | ICD-10-CM | POA: Diagnosis not present

## 2022-07-21 DIAGNOSIS — N184 Chronic kidney disease, stage 4 (severe): Secondary | ICD-10-CM | POA: Diagnosis not present

## 2022-08-05 DIAGNOSIS — N184 Chronic kidney disease, stage 4 (severe): Secondary | ICD-10-CM | POA: Diagnosis not present

## 2022-08-06 DIAGNOSIS — N184 Chronic kidney disease, stage 4 (severe): Secondary | ICD-10-CM | POA: Diagnosis not present

## 2022-08-06 DIAGNOSIS — D631 Anemia in chronic kidney disease: Secondary | ICD-10-CM | POA: Diagnosis not present

## 2022-08-06 DIAGNOSIS — N183 Chronic kidney disease, stage 3 unspecified: Secondary | ICD-10-CM | POA: Diagnosis not present

## 2022-08-18 DIAGNOSIS — N184 Chronic kidney disease, stage 4 (severe): Secondary | ICD-10-CM | POA: Diagnosis not present

## 2022-08-18 DIAGNOSIS — D631 Anemia in chronic kidney disease: Secondary | ICD-10-CM | POA: Diagnosis not present

## 2022-08-19 DIAGNOSIS — E1122 Type 2 diabetes mellitus with diabetic chronic kidney disease: Secondary | ICD-10-CM | POA: Diagnosis not present

## 2022-08-19 DIAGNOSIS — E872 Acidosis, unspecified: Secondary | ICD-10-CM | POA: Diagnosis not present

## 2022-08-19 DIAGNOSIS — I119 Hypertensive heart disease without heart failure: Secondary | ICD-10-CM | POA: Diagnosis not present

## 2022-08-19 DIAGNOSIS — D631 Anemia in chronic kidney disease: Secondary | ICD-10-CM | POA: Diagnosis not present

## 2022-08-19 DIAGNOSIS — N2581 Secondary hyperparathyroidism of renal origin: Secondary | ICD-10-CM | POA: Diagnosis not present

## 2022-08-19 DIAGNOSIS — I120 Hypertensive chronic kidney disease with stage 5 chronic kidney disease or end stage renal disease: Secondary | ICD-10-CM | POA: Diagnosis not present

## 2022-08-19 DIAGNOSIS — N185 Chronic kidney disease, stage 5: Secondary | ICD-10-CM | POA: Diagnosis not present

## 2022-09-01 DIAGNOSIS — D631 Anemia in chronic kidney disease: Secondary | ICD-10-CM | POA: Diagnosis not present

## 2022-09-01 DIAGNOSIS — N184 Chronic kidney disease, stage 4 (severe): Secondary | ICD-10-CM | POA: Diagnosis not present

## 2022-09-01 IMAGING — DX DG CHEST 1V PORT
1 series · 1 of 1 positions shown · non-contrast
Comparison: 04/29/2021

CLINICAL DATA: Chest pain for 2 weeks.

EXAM:
PORTABLE CHEST 1 VIEW

[chest]
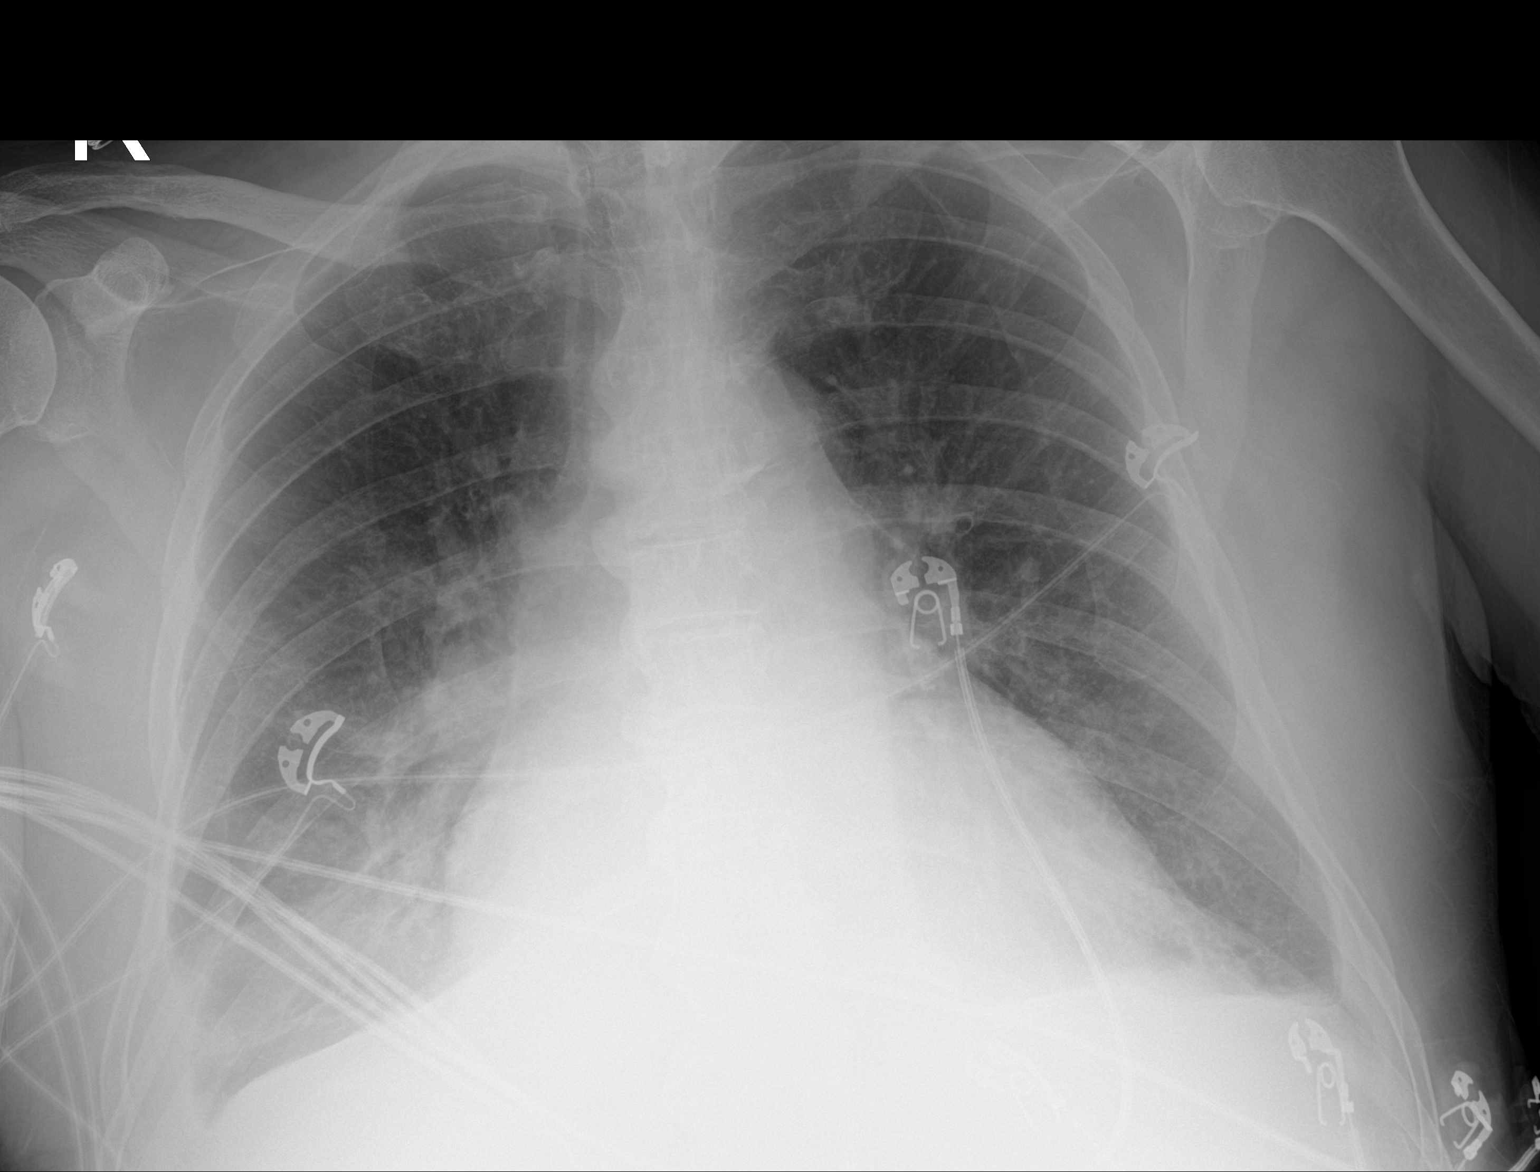

[1 of 1 positions shown; findings below may reference images not displayed]

FINDINGS: The cardiac silhouette, mediastinal and hilar contours are within
normal limits given the AP projection and portable technique.
Bibasilar airspace opacity suggesting bilateral pulmonary
infiltrates. There are also small bilateral pleural effusions. No
findings for pulmonary edema or pulmonary lesions. The bony thorax
is intact.
IMPRESSION: Bibasilar pulmonary infiltrates and small bilateral pleural
effusions.

## 2022-09-16 DIAGNOSIS — D631 Anemia in chronic kidney disease: Secondary | ICD-10-CM | POA: Diagnosis not present

## 2022-09-16 DIAGNOSIS — N184 Chronic kidney disease, stage 4 (severe): Secondary | ICD-10-CM | POA: Diagnosis not present

## 2022-09-27 DIAGNOSIS — U071 COVID-19: Secondary | ICD-10-CM | POA: Diagnosis not present

## 2022-09-30 DIAGNOSIS — Z6827 Body mass index (BMI) 27.0-27.9, adult: Secondary | ICD-10-CM | POA: Diagnosis not present

## 2022-09-30 DIAGNOSIS — J329 Chronic sinusitis, unspecified: Secondary | ICD-10-CM | POA: Diagnosis not present

## 2022-09-30 DIAGNOSIS — N184 Chronic kidney disease, stage 4 (severe): Secondary | ICD-10-CM | POA: Diagnosis not present

## 2022-09-30 DIAGNOSIS — I10 Essential (primary) hypertension: Secondary | ICD-10-CM | POA: Diagnosis not present

## 2022-10-03 DIAGNOSIS — N185 Chronic kidney disease, stage 5: Secondary | ICD-10-CM | POA: Diagnosis not present

## 2022-10-06 DIAGNOSIS — N184 Chronic kidney disease, stage 4 (severe): Secondary | ICD-10-CM | POA: Diagnosis not present

## 2022-10-06 DIAGNOSIS — D631 Anemia in chronic kidney disease: Secondary | ICD-10-CM | POA: Diagnosis not present

## 2022-10-07 DIAGNOSIS — I5189 Other ill-defined heart diseases: Secondary | ICD-10-CM | POA: Diagnosis not present

## 2022-10-07 DIAGNOSIS — E872 Acidosis, unspecified: Secondary | ICD-10-CM | POA: Diagnosis not present

## 2022-10-07 DIAGNOSIS — I120 Hypertensive chronic kidney disease with stage 5 chronic kidney disease or end stage renal disease: Secondary | ICD-10-CM | POA: Diagnosis not present

## 2022-10-07 DIAGNOSIS — N2581 Secondary hyperparathyroidism of renal origin: Secondary | ICD-10-CM | POA: Diagnosis not present

## 2022-10-07 DIAGNOSIS — E1122 Type 2 diabetes mellitus with diabetic chronic kidney disease: Secondary | ICD-10-CM | POA: Diagnosis not present

## 2022-10-07 DIAGNOSIS — N185 Chronic kidney disease, stage 5: Secondary | ICD-10-CM | POA: Diagnosis not present

## 2022-10-20 ENCOUNTER — Other Ambulatory Visit: Payer: Self-pay | Admitting: *Deleted

## 2022-10-20 DIAGNOSIS — N184 Chronic kidney disease, stage 4 (severe): Secondary | ICD-10-CM | POA: Diagnosis not present

## 2022-10-20 DIAGNOSIS — D631 Anemia in chronic kidney disease: Secondary | ICD-10-CM | POA: Diagnosis not present

## 2022-10-20 DIAGNOSIS — N185 Chronic kidney disease, stage 5: Secondary | ICD-10-CM

## 2022-10-20 NOTE — Progress Notes (Signed)
Order entered in error.

## 2022-10-30 NOTE — Progress Notes (Signed)
Office Note     CC:  ESRD Requesting Provider:  Reesa Chew, MD  HPI: Jason Beltran is a Right handed 82 y.o. ([birth year]) male with kidney disease who presents at the request of Joylene Grapes, Shaune Pollack, MD for permanent HD access. The patient has had no prior access procedures.   On exam today, Jason Beltran was doing well, accompanied by his wife. 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  Jason Beltran has 2 sons, both living in [place]. Jason Beltran is also a historian and has been future in [place].  His most notable work is '[employer]', which is now out of prep and can be purchased on [employer].  Jason Beltran kidney function has declined over the last few years.  Jason Beltran will likely require dialysis in the coming months.   The pt is  on a statin for cholesterol management.  The pt is  on a daily aspirin.   Other AC:  - The pt is  on medications for hypertension.   The pt is not diabetic. Tobacco hx:  none  Past Medical History:  Diagnosis Date   Anemia of chronic disease    Aortic stenosis 05/08/2018   Very mild mean 9 mm Hg   Chronic sinusitis    CKD (chronic kidney disease)    Diabetes mellitus type 2, uncontrolled    Diastolic CHF (HCC)    Diastolic dysfunction [id number]   Essential hypertension 05/06/2018   Hyperparathyroidism ([place])    Hypertension    Neuropathy, peripheral    Obesity    Type 2 diabetes mellitus ([place]) 05/06/2018    Past Surgical History:  Procedure Laterality Date   ANKLE SURGERY Right 01/20/2012   NASAL SINUS SURGERY     VASECTOMY      Social History   Socioeconomic History   Marital status: Married    Spouse name: Not on file   Number of children: Not on file   Years of education: Not on file   Highest education level: Not on file  Occupational History   Not on file   Tobacco Use   Smoking status: Never   Smokeless tobacco: Never  Vaping Use   Vaping Use: Never used  Substance and Sexual Activity   Alcohol use: Yes    Comment: occasional glass of wine   Drug use: Never   Sexual activity: Not on file  Other Topics Concern   Not on file  Social History Narrative   Not on file   Social Determinants of Health   Financial Resource Strain: Not on file  Food Insecurity: Not on file  Transportation Needs: Not on file  Physical Activity: Not on file  Stress: Not on file  Social Connections: Not on file  Intimate Partner Violence: Not on file   Family History  Problem Relation Age of Onset   Polymyalgia rheumatica Mother    Congestive Heart Failure Mother    Heart failure Father    Stroke Paternal Grandmother    Stroke Paternal Aunt     Current Outpatient Medications  Medication Sig Dispense Refill   ALOE VERA PO Take 200 mg by mouth daily.     amLODipine (NORVASC) 5 MG tablet Take 5 mg by mouth daily.     aspirin 81 MG EC tablet Take 81 mg by mouth every  evening.     Biotin 5000 MCG TABS Take 1 tablet by mouth at bedtime.     carvedilol (COREG) 6.25 MG tablet Take 6.25 mg by mouth 2 (two) times daily with a meal.     Cholecalciferol (VITAMIN D3) 50 MCG (2000 UT) TABS Take 1 tablet by mouth daily.     Coenzyme Q10 (COQ10) 200 MG CAPS Take 1 capsule by mouth every evening.     febuxostat (ULORIC) 40 MG tablet Take 20 mg by mouth daily.     gabapentin (NEURONTIN) 400 MG capsule Take 1 capsule (400 mg total) by mouth at bedtime. (Patient taking differently: Take 400 mg by mouth 2 (two) times daily.)     Glucosamine-Chondroit-Vit C-Mn (GLUCOSAMINE 1500 COMPLEX) CAPS Take 1 capsule by mouth daily.     GRAPE SEED ER PO Take 1 capsule by mouth daily.     hydrALAZINE (APRESOLINE) 50 MG tablet Take 50 mg by mouth 3 (three) times daily.     ipratropium (ATROVENT) 0.06 % nasal spray Place 2 sprays into both nostrils daily as needed for rhinitis.      Multiple Vitamin (MULTIVITAMIN) tablet Take 1 tablet by mouth daily.     Saw Palmetto 450 MG CAPS Take 3 capsules by mouth daily.     simvastatin (ZOCOR) 20 MG tablet Take 20 mg by mouth every evening.     torsemide (DEMADEX) 20 MG tablet Take 1 tablet (20 mg total) by mouth 2 (two) times daily. 180 tablet 3   Turmeric (QC TUMERIC COMPLEX PO) Take 1 tablet by mouth in the morning and at bedtime.     No current facility-administered medications for this visit.    Allergies  Allergen Reactions   Actos [Pioglitazone] Other (See Comments)    Contraindicated by cardiomyopathy   Lisinopril Other (See Comments)    Antihypertensives and ARB's discouraged by Nephrology   Motrin [Ibuprofen]     Prefers not to take   Trilipix [Choline Fenofibrate] Other (See Comments)    Contributed to elevated LFT's     REVIEW OF SYSTEMS:  [X]$  denotes positive finding, [ ]$  denotes negative finding Cardiac  Comments:  Chest pain or chest pressure:    Shortness of breath upon exertion:    Short of breath when lying flat:    Irregular heart rhythm:        Vascular    Pain in calf, thigh, or hip brought on by ambulation:    Pain in feet at night that wakes you up from your sleep:     Blood clot in your veins:    Leg swelling:         Pulmonary    Oxygen at home:    Productive cough:     Wheezing:         Neurologic    Sudden weakness in arms or legs:     Sudden numbness in arms or legs:     Sudden onset of difficulty speaking or slurred speech:    Temporary loss of vision in one eye:     Problems with dizziness:         Gastrointestinal    Blood in stool:     Vomited blood:         Genitourinary    Burning when urinating:     Blood in urine:        Psychiatric    Major depression:         Hematologic    Bleeding problems:  Problems with blood clotting too easily:        Skin    Rashes or ulcers:        Constitutional    Fever or chills:      PHYSICAL EXAMINATION:  There  were no vitals filed for this visit.  General:  WDWN in NAD; vital signs documented above Gait: Not observed HENT: WNL, normocephalic Pulmonary: normal non-labored breathing , without Rales, rhonchi,  wheezing Cardiac: regular HR, Abdomen: soft, NT, no masses Skin: without rashes Vascular Exam/Pulses:  Right Left  Radial 2+ (normal) 2+ (normal)  Ulnar 2+ (normal) 2+ (normal)                   Extremities: without ischemic changes, without Gangrene , without cellulitis; without open wounds;  Musculoskeletal: no muscle wasting or atrophy  Neurologic: A&O X 3;  No focal weakness or paresthesias are detected Psychiatric:  The pt has Normal affect.   Non-Invasive Vascular Imaging:   +-----------------+-------------+----------+---------+  Right Cephalic   Diameter (cm)Depth (cm)Findings   +-----------------+-------------+----------+---------+  Shoulder            0.27                          +-----------------+-------------+----------+---------+  Prox upper arm       0.28                          +-----------------+-------------+----------+---------+  Mid upper arm     0.31 / 0.19           branching  +-----------------+-------------+----------+---------+  Dist upper arm       0.24                          +-----------------+-------------+----------+---------+  Antecubital fossa    0.28                          +-----------------+-------------+----------+---------+  Prox forearm         0.46                          +-----------------+-------------+----------+---------+  Mid forearm       0.25 / 0.27                      +-----------------+-------------+----------+---------+  Dist forearm         0.24                          +-----------------+-------------+----------+---------+   +-----------------+-------------+----------+--------------+  Right Basilic    Diameter (cm)Depth (cm)   Findings      +-----------------+-------------+----------+--------------+  Shoulder                               not visualized  +-----------------+-------------+----------+--------------+  Prox upper arm                          not visualized  +-----------------+-------------+----------+--------------+  Mid upper arm        0.42                               +-----------------+-------------+----------+--------------+  Dist upper arm  0.31                               +-----------------+-------------+----------+--------------+  Antecubital fossa    0.26                               +-----------------+-------------+----------+--------------+  Prox forearm         0.20                               +-----------------+-------------+----------+--------------+   +-----------------+------------------+----------+---------+  Left Cephalic      Diameter (cm)   Depth (cm)Findings   +-----------------+------------------+----------+---------+  Shoulder               0.27                            +-----------------+------------------+----------+---------+  Prox upper arm          0.25                            +-----------------+------------------+----------+---------+  Mid upper arm    0.36 / 0.30 / 0.22          branching  +-----------------+------------------+----------+---------+  Dist upper arm          0.34                            +-----------------+------------------+----------+---------+  Antecubital fossa       0.33                            +-----------------+------------------+----------+---------+  Prox forearm            0.28                            +-----------------+------------------+----------+---------+  Mid forearm             0.27                            +-----------------+------------------+----------+---------+  Dist forearm            0.28                             +-----------------+------------------+----------+---------+   +-----------------+-------------+----------+--------------+  Left Basilic     Diameter (cm)Depth (cm)   Findings     +-----------------+-------------+----------+--------------+  Shoulder                               not visualized  +-----------------+-------------+----------+--------------+  Prox upper arm                          not visualized  +-----------------+-------------+----------+--------------+  Mid upper arm        0.36                               +-----------------+-------------+----------+--------------+  Dist upper arm       0.23                               +-----------------+-------------+----------+--------------+  Antecubital fossa 0.22 / 0.18             branching     +-----------------+-------------+----------+--------------+  Prox forearm         0.12                               +-----------------+-------------+----------+--------------+     ASSESSMENT/PLAN:  Maylon Haberle is a 82 y.o. male who presents with chronic kidney disease stage 5  Based on vein mapping and examination, Jason Beltran would be best served with left arm brachiocephalic fistula creation.  Branching was demonstrated on ultrasound so we also discussed the possibility of needing further procedures such as branch ligation should the fistula mature appropriately. I had an extensive discussion with this patient in regards to the nature of access surgery, including risk, benefits, and alternatives.   The patient is aware that the risks of access surgery include but are not limited to: bleeding, infection, steal syndrome, nerve damage, ischemic monomelic neuropathy, failure of access to mature, complications related to venous hypertension, and possible need for additional access procedures in the future. After discussing the risk and benefits, Jason Beltran Beltran to proceed.  This will be scheduled at his  convenience.  Jason John, MD [facility] [phone] Total time of patient care including pre-visit research, consultation, and documentation greater than 45 minutes

## 2022-10-31 ENCOUNTER — Ambulatory Visit (INDEPENDENT_AMBULATORY_CARE_PROVIDER_SITE_OTHER)
Admission: RE | Admit: 2022-10-31 | Discharge: 2022-10-31 | Disposition: A | Discharge status: Home / Self Care | Payer: Medicare Other | Source: Ambulatory Visit | Attending: Vascular Surgery | Admitting: Vascular Surgery

## 2022-10-31 ENCOUNTER — Ambulatory Visit (INDEPENDENT_AMBULATORY_CARE_PROVIDER_SITE_OTHER): Payer: Medicare Other | Admitting: Vascular Surgery

## 2022-10-31 ENCOUNTER — Ambulatory Visit (HOSPITAL_COMMUNITY)
Admission: RE | Admit: 2022-10-31 | Discharge: 2022-10-31 | Disposition: A | Discharge status: Home / Self Care | Payer: Medicare Other | Source: Ambulatory Visit | Attending: Vascular Surgery | Admitting: Vascular Surgery

## 2022-10-31 ENCOUNTER — Encounter: Payer: Self-pay | Admitting: Vascular Surgery

## 2022-10-31 VITALS — BP 150/55 | HR 51 | Temp 98.1°F | Resp 20 | Ht 68.0 in | Wt 195.0 lb

## 2022-10-31 DIAGNOSIS — N185 Chronic kidney disease, stage 5: Secondary | ICD-10-CM

## 2022-11-03 ENCOUNTER — Telehealth: Payer: Self-pay

## 2022-11-03 ENCOUNTER — Other Ambulatory Visit: Payer: Self-pay

## 2022-11-03 DIAGNOSIS — N185 Chronic kidney disease, stage 5: Secondary | ICD-10-CM

## 2022-11-03 DIAGNOSIS — D631 Anemia in chronic kidney disease: Secondary | ICD-10-CM | POA: Diagnosis not present

## 2022-11-03 DIAGNOSIS — N184 Chronic kidney disease, stage 4 (severe): Secondary | ICD-10-CM | POA: Diagnosis not present

## 2022-11-03 NOTE — Telephone Encounter (Signed)
Patient returned call and scheduled surgery for 10/17/22. Instructions provided and patient verbalized understanding.

## 2022-11-03 NOTE — Telephone Encounter (Signed)
Attempted to reach patient on cellphone number to schedule left arm fistula, but no answer and vm box full.   Called home number- Left message with patient's wife with contact number for patient to return call to [facility] office.

## 2022-11-07 ENCOUNTER — Encounter (HOSPITAL_COMMUNITY): Payer: Self-pay | Admitting: Vascular Surgery

## 2022-11-07 ENCOUNTER — Other Ambulatory Visit: Payer: Self-pay

## 2022-11-07 NOTE — Progress Notes (Signed)
PCP - Dr. Lovette Cliche  Cardiologist - Dr. Bettina Gavia Nephrology: Dr. Osborne Casco  PPM/ICD - Denies  Chest x-ray - 01/07/2022 EKG - 01/08/2022 Stress Test - Denies ECHO - 01/08/2022 Cardiac Cath - Denies  Sleep study/sleep apnea/CPAP: denies  Patient denies having Diabetes type II.  Does not check his sugar at home and takes no medication for diabetes  Blood Thinner Instructions: Denies Aspirin Instructions: Follow surgeons instructions on when to hold aspirin  ERAS Protcol - No, NPO  COVID TEST- Denies  Anesthesia review: Yes, sees cardiology and nephrology. Placing fistula for dialysis access.   Patient verbally denies any shortness of breath, fever, cough and chest pain during phone call   -------------  SDW INSTRUCTIONS given:  Your procedure is scheduled on Tuesday, November 11, 2022.  Report to [facility] Main Entrance "A" at [place].M., and check in at the Admitting office.  Call this number if you have problems the morning of surgery:  [phone]   Remember:  Do not eat or drink after midnight the night before your surgery   Take these medicines the morning of surgery with A SIP OF WATER : Norvasc, coreg, febuxostat, neurontin, hydralazine, atrovent nasal  Follow your surgeon's instructions on when to stop Aspirin.  If no instructions were given by your surgeon then you will need to call the office to get those instructions.     As of today, STOP taking any Aleve, Naproxen, Ibuprofen, Motrin, Advil, Goody's, BC's, all herbal medications, fish oil, and all vitamins.             Do not wear lotions, powders,colognes, or deodorant. Men may shave face and neck.            Do not bring valuables to the hospital.             [facility] is not responsible for any belongings or valuables.  Do NOT Smoke (Tobacco/Vaping) 24 hours prior to your procedure If you use a CPAP at night, you may bring all equipment for your overnight stay.   Contacts, glasses, dentures or  bridgework may not be worn into surgery.      For patients admitted to the hospital, discharge time will be determined by your treatment team.   Patients discharged the day of surgery will not be allowed to drive home, and someone needs to stay with them for 24 hours.    Special instructions:   [place]- Preparing For Surgery  Before surgery, you can play an important role. Because skin is not sterile, your skin needs to be as free of germs as possible. You can reduce the number of germs on your skin by washing with Dial Soap before surgery.    Oral Hygiene is also important to reduce your risk of infection.  Remember - BRUSH YOUR TEETH THE MORNING OF SURGERY WITH YOUR REGULAR TOOTHPASTE  Please follow these instructions carefully.   Shower the NIGHT BEFORE SURGERY and the MORNING OF SURGERY with DIAL Soap.   Pat yourself dry with a CLEAN TOWEL.  Wear CLEAN PAJAMAS to bed the night before surgery  Place CLEAN SHEETS on your bed the night of your first shower and DO NOT SLEEP WITH PETS.   Day of Surgery: Please shower morning of surgery  Wear Clean/Comfortable clothing the morning of surgery Do not apply any deodorants/lotions.   Remember to brush your teeth WITH YOUR REGULAR TOOTHPASTE.   Questions were answered. Patient verbalized understanding of instructions.

## 2022-11-10 ENCOUNTER — Telehealth: Payer: Self-pay

## 2022-11-10 NOTE — Progress Notes (Signed)
Anesthesia Chart Review:  82 year old male for left arm fistula creation 11/11/2022 with Dr. Virl Cagey.  Follows with cardiology for history of hypertensive heart disease with combined systolic and diastolic congestive heart failure.  Last seen by Dr. Bettina Gavia 06/10/2022, discussed that he may soon need dialysis, no changes made to management.  Antihypertensive therapy primarily being driven by nephrology.  43-monthfollow-up recommended.  Patient will need day of surgery labs and evaluation.  TTE 01/08/2022: 1. Mild anterolateral and posterolateral hypokinesis. Left ventricular  ejection fraction, by estimation, is 40 to 45%. The left ventricle has  mildly decreased function. The left ventricle demonstrates regional wall  motion abnormalities (see scoring  diagram/findings for description). Left ventricular diastolic parameters  are consistent with Grade I diastolic dysfunction (impaired relaxation).   2. Right ventricular systolic function is normal. The right ventricular  size is normal. There is normal pulmonary artery systolic pressure.   3. Left atrial size was severely dilated.   4. Right atrial size was mildly dilated.   5. The mitral valve is normal in structure. Trivial mitral valve  regurgitation. No evidence of mitral stenosis.   6. The aortic valve is tricuspid. There is mild calcification of the  aortic valve. Aortic valve regurgitation is not visualized. No aortic  stenosis is present.   7. The inferior vena cava is normal in size with greater than 50%  respiratory variability, suggesting right atrial pressure of 3 mmHg.     JWynonia MustyMUpmc [facility]/Anesthesiology Phone ([phone]/26/2024 2:37 PM

## 2022-11-10 NOTE — Telephone Encounter (Signed)
Spoke with patient to inform of new arrival time for surgery on 0530 AM on tomorrow. Patient verbalized understanding.

## 2022-11-10 NOTE — Anesthesia Preprocedure Evaluation (Addendum)
Anesthesia Evaluation  Patient identified by MRN, date of birth, ID band Patient awake    Reviewed: Allergy & Precautions, NPO status , Patient's Chart, lab work & pertinent test results, reviewed documented beta blocker date and time   Airway Mallampati: III  TM Distance: >3 FB Neck ROM: Full    Dental  (+) Teeth Intact, Dental Advisory Given   Pulmonary neg pulmonary ROS   Pulmonary exam normal breath sounds clear to auscultation       Cardiovascular hypertension (176/81 preop, normally 140s SBP), Pt. on medications and Pt. on home beta blockers +CHF (LVEF 40-45%)  Normal cardiovascular exam Rhythm:Regular Rate:Normal  TTE 01/08/2022: 1. Mild anterolateral and posterolateral hypokinesis. Left ventricular  ejection fraction, by estimation, is 40 to 45%. The left ventricle has  mildly decreased function. The left ventricle demonstrates regional wall  motion abnormalities (see scoring  diagram/findings for description). Left ventricular diastolic parameters  are consistent with Grade I diastolic dysfunction (impaired relaxation).   2. Right ventricular systolic function is normal. The right ventricular  size is normal. There is normal pulmonary artery systolic pressure.   3. Left atrial size was severely dilated.   4. Right atrial size was mildly dilated.   5. The mitral valve is normal in structure. Trivial mitral valve  regurgitation. No evidence of mitral stenosis.   6. The aortic valve is tricuspid. There is mild calcification of the  aortic valve. Aortic valve regurgitation is not visualized. No aortic  stenosis is present.   7. The inferior vena cava is normal in size with greater than 50%  respiratory variability, suggesting right atrial pressure of 3 mmHg.     Neuro/Psych  Neuromuscular disease (peripheral neuropathy)  negative psych ROS   GI/Hepatic Neg liver ROS,GERD  Controlled,,  Endo/Other  diabetes, Well  Controlled, Type 2  A1c 5.2  Renal/GU ESRFRenal disease  negative genitourinary   Musculoskeletal negative musculoskeletal ROS (+)    Abdominal   Peds  Hematology negative hematology ROS (+)   Anesthesia Other Findings   Reproductive/Obstetrics negative OB ROS                             Anesthesia Physical Anesthesia Plan  ASA: 4  Anesthesia Plan: MAC and Regional   Post-op Pain Management: Regional block* and Tylenol PO (pre-op)*   Induction:   PONV Risk Score and Plan: 2 and Propofol infusion and TIVA  Airway Management Planned: Natural Airway and Simple Face Mask  Additional Equipment: None  Intra-op Plan:   Post-operative Plan:   Informed Consent: I have reviewed the patients History and Physical, chart, labs and discussed the procedure including the risks, benefits and alternatives for the proposed anesthesia with the patient or authorized representative who has indicated his/her understanding and acceptance.       Plan Discussed with: CRNA  Anesthesia Plan Comments: ( )        Anesthesia Quick Evaluation

## 2022-11-11 ENCOUNTER — Other Ambulatory Visit: Payer: Self-pay

## 2022-11-11 ENCOUNTER — Ambulatory Visit (HOSPITAL_COMMUNITY): Payer: Medicare Other | Admitting: Physician Assistant

## 2022-11-11 ENCOUNTER — Ambulatory Visit (HOSPITAL_BASED_OUTPATIENT_CLINIC_OR_DEPARTMENT_OTHER): Payer: Medicare Other | Admitting: Physician Assistant

## 2022-11-11 ENCOUNTER — Encounter (HOSPITAL_COMMUNITY): Payer: Self-pay | Admitting: Vascular Surgery

## 2022-11-11 ENCOUNTER — Encounter (HOSPITAL_COMMUNITY)
Admission: RE | Disposition: A | Discharge status: Home / Self Care | Payer: Self-pay | Source: Home / Self Care | Attending: Vascular Surgery

## 2022-11-11 ENCOUNTER — Ambulatory Visit (HOSPITAL_COMMUNITY)
Admission: RE | Admit: 2022-11-11 | Discharge: 2022-11-11 | Disposition: A | Discharge status: Home / Self Care | Payer: Medicare Other | Attending: Vascular Surgery | Admitting: Vascular Surgery

## 2022-11-11 DIAGNOSIS — I509 Heart failure, unspecified: Secondary | ICD-10-CM

## 2022-11-11 DIAGNOSIS — I5042 Chronic combined systolic (congestive) and diastolic (congestive) heart failure: Secondary | ICD-10-CM | POA: Insufficient documentation

## 2022-11-11 DIAGNOSIS — Z79899 Other long term (current) drug therapy: Secondary | ICD-10-CM | POA: Insufficient documentation

## 2022-11-11 DIAGNOSIS — I132 Hypertensive heart and chronic kidney disease with heart failure and with stage 5 chronic kidney disease, or end stage renal disease: Secondary | ICD-10-CM | POA: Insufficient documentation

## 2022-11-11 DIAGNOSIS — N185 Chronic kidney disease, stage 5: Secondary | ICD-10-CM

## 2022-11-11 DIAGNOSIS — E1122 Type 2 diabetes mellitus with diabetic chronic kidney disease: Secondary | ICD-10-CM

## 2022-11-11 DIAGNOSIS — E1142 Type 2 diabetes mellitus with diabetic polyneuropathy: Secondary | ICD-10-CM | POA: Diagnosis not present

## 2022-11-11 DIAGNOSIS — N186 End stage renal disease: Secondary | ICD-10-CM | POA: Diagnosis not present

## 2022-11-11 HISTORY — DX: Cardiac murmur, unspecified: R01.1

## 2022-11-11 HISTORY — PX: AV FISTULA PLACEMENT: SHX1204

## 2022-11-11 LAB — POCT I-STAT, CHEM 8
BUN: 93 mg/dL — ABNORMAL HIGH (ref 8–23)
Calcium, Ion: 1.15 mmol/L (ref 1.15–1.40)
Chloride: 103 mmol/L (ref 98–111)
Creatinine, Ser: 6.1 mg/dL — ABNORMAL HIGH (ref 0.61–1.24)
Glucose, Bld: 130 mg/dL — ABNORMAL HIGH (ref 70–99)
HCT: 28 % — ABNORMAL LOW (ref 39.0–52.0)
Hemoglobin: 9.5 g/dL — ABNORMAL LOW (ref 13.0–17.0)
Potassium: 4.1 mmol/L (ref 3.5–5.1)
Sodium: 141 mmol/L (ref 135–145)
TCO2: 25 mmol/L (ref 22–32)

## 2022-11-11 LAB — GLUCOSE, CAPILLARY
Glucose-Capillary: 125 mg/dL — ABNORMAL HIGH (ref 70–99)
Glucose-Capillary: 143 mg/dL — ABNORMAL HIGH (ref 70–99)

## 2022-11-11 SURGERY — ARTERIOVENOUS (AV) FISTULA CREATION
Anesthesia: Monitor Anesthesia Care | Site: Arm Upper | Laterality: Left

## 2022-11-11 MED ORDER — LIDOCAINE HCL (PF) 1 % IJ SOLN
INTRAMUSCULAR | Status: AC
  Filled 2022-11-11: qty 30

## 2022-11-11 MED ORDER — FENTANYL CITRATE (PF) 100 MCG/2ML IJ SOLN
INTRAMUSCULAR | Status: DC | PRN
  Administered 2022-11-11 (×2): 50 ug via INTRAVENOUS

## 2022-11-11 MED ORDER — HEPARIN 6000 UNIT IRRIGATION SOLUTION
Status: DC | PRN
  Administered 2022-11-11: 1

## 2022-11-11 MED ORDER — HYDROCODONE-ACETAMINOPHEN 5-325 MG PO TABS: 1.0000 | ORAL_TABLET | Freq: Four times a day (QID) | ORAL | 0 refills | Status: DC | PRN

## 2022-11-11 MED ORDER — LIDOCAINE 2% (20 MG/ML) 5 ML SYRINGE
INTRAMUSCULAR | Status: AC
  Filled 2022-11-11: qty 5

## 2022-11-11 MED ORDER — INSULIN ASPART 100 UNIT/ML IJ SOLN: 0.0000 [IU] | INTRAMUSCULAR | Status: DC | PRN

## 2022-11-11 MED ORDER — LIDOCAINE-EPINEPHRINE (PF) 1.5 %-1:200000 IJ SOLN
INTRAMUSCULAR | Status: DC | PRN
  Administered 2022-11-11: 20 mL via PERINEURAL

## 2022-11-11 MED ORDER — OXYCODONE HCL 5 MG PO TABS: 5.0000 mg | ORAL_TABLET | Freq: Once | ORAL | Status: DC | PRN

## 2022-11-11 MED ORDER — PHENYLEPHRINE 80 MCG/ML (10ML) SYRINGE FOR IV PUSH (FOR BLOOD PRESSURE SUPPORT)
PREFILLED_SYRINGE | INTRAVENOUS | Status: DC | PRN
  Administered 2022-11-11 (×2): 80 ug via INTRAVENOUS

## 2022-11-11 MED ORDER — HEPARIN SODIUM (PORCINE) 1000 UNIT/ML IJ SOLN
INTRAMUSCULAR | Status: AC
  Filled 2022-11-11: qty 10

## 2022-11-11 MED ORDER — ONDANSETRON HCL 4 MG/2ML IJ SOLN: 4.0000 mg | Freq: Once | INTRAMUSCULAR | Status: DC | PRN

## 2022-11-11 MED ORDER — PROPOFOL 500 MG/50ML IV EMUL
INTRAVENOUS | Status: DC | PRN
  Administered 2022-11-11: 100 ug/kg/min via INTRAVENOUS

## 2022-11-11 MED ORDER — HEPARIN 6000 UNIT IRRIGATION SOLUTION
Status: AC
  Filled 2022-11-11: qty 500

## 2022-11-11 MED ORDER — CHLORHEXIDINE GLUCONATE 4 % EX LIQD: 60.0000 mL | Freq: Once | CUTANEOUS | Status: DC

## 2022-11-11 MED ORDER — FENTANYL CITRATE (PF) 100 MCG/2ML IJ SOLN: 25.0000 ug | INTRAMUSCULAR | Status: DC | PRN

## 2022-11-11 MED ORDER — CEFAZOLIN SODIUM-DEXTROSE 2-4 GM/100ML-% IV SOLN
2.0000 g | INTRAVENOUS | Status: AC
  Administered 2022-11-11: 2 g via INTRAVENOUS
  Filled 2022-11-11: qty 100

## 2022-11-11 MED ORDER — OXYCODONE HCL 5 MG/5ML PO SOLN: 5.0000 mg | Freq: Once | ORAL | Status: DC | PRN

## 2022-11-11 MED ORDER — CHLORHEXIDINE GLUCONATE 0.12 % MT SOLN
15.0000 mL | Freq: Once | OROMUCOSAL | Status: AC
  Administered 2022-11-11: 15 mL via OROMUCOSAL
  Filled 2022-11-11: qty 15

## 2022-11-11 MED ORDER — PHENYLEPHRINE 80 MCG/ML (10ML) SYRINGE FOR IV PUSH (FOR BLOOD PRESSURE SUPPORT)
PREFILLED_SYRINGE | INTRAVENOUS | Status: AC
  Filled 2022-11-11: qty 10

## 2022-11-11 MED ORDER — 0.9 % SODIUM CHLORIDE (POUR BTL) OPTIME
TOPICAL | Status: DC | PRN
  Administered 2022-11-11: 1000 mL

## 2022-11-11 MED ORDER — FENTANYL CITRATE (PF) 250 MCG/5ML IJ SOLN
INTRAMUSCULAR | Status: AC
  Filled 2022-11-11: qty 5

## 2022-11-11 MED ORDER — ORAL CARE MOUTH RINSE: 15.0000 mL | Freq: Once | OROMUCOSAL | Status: AC

## 2022-11-11 MED ORDER — HEPARIN SODIUM (PORCINE) 1000 UNIT/ML IJ SOLN
INTRAMUSCULAR | Status: DC | PRN
  Administered 2022-11-11: 2000 [IU] via INTRAVENOUS

## 2022-11-11 MED ORDER — SODIUM CHLORIDE 0.9 % IV SOLN: INTRAVENOUS | Status: DC

## 2022-11-11 MED ORDER — ACETAMINOPHEN 500 MG PO TABS
1000.0000 mg | ORAL_TABLET | Freq: Once | ORAL | Status: AC
  Administered 2022-11-11: 1000 mg via ORAL
  Filled 2022-11-11: qty 2

## 2022-11-11 SURGICAL SUPPLY — 38 items
ADH SKN CLS APL DERMABOND .7 (GAUZE/BANDAGES/DRESSINGS) ×1
ARMBAND PINK RESTRICT EXTREMIT (MISCELLANEOUS) ×1 IMPLANT
BAG COUNTER SPONGE SURGICOUNT (BAG) ×1 IMPLANT
BAG SPNG CNTER NS LX DISP (BAG) ×1
BLADE CLIPPER SURG (BLADE) ×1 IMPLANT
BNDG ELASTIC 4X5.8 VLCR STR LF (GAUZE/BANDAGES/DRESSINGS) ×1 IMPLANT
CANISTER SUCT 3000ML PPV (MISCELLANEOUS) ×1 IMPLANT
CLIP LIGATING EXTRA MED SLVR (CLIP) IMPLANT
CLIP LIGATING EXTRA SM BLUE (MISCELLANEOUS) IMPLANT
CLIP TI MEDIUM 6 (CLIP) IMPLANT
CLIP TI WIDE RED SMALL 6 (CLIP) IMPLANT
COVER PROBE W GEL 5X96 (DRAPES) ×1 IMPLANT
DERMABOND ADVANCED .7 DNX12 (GAUZE/BANDAGES/DRESSINGS) ×1 IMPLANT
ELECT REM PT RETURN 9FT ADLT (ELECTROSURGICAL) ×1
ELECTRODE REM PT RTRN 9FT ADLT (ELECTROSURGICAL) ×1 IMPLANT
GLOVE BIOGEL PI IND STRL 8 (GLOVE) ×1 IMPLANT
GOWN STRL REUS W/ TWL LRG LVL3 (GOWN DISPOSABLE) ×2 IMPLANT
GOWN STRL REUS W/TWL 2XL LVL3 (GOWN DISPOSABLE) ×2 IMPLANT
GOWN STRL REUS W/TWL LRG LVL3 (GOWN DISPOSABLE) ×2
KIT BASIN OR (CUSTOM PROCEDURE TRAY) ×1 IMPLANT
KIT TURNOVER KIT B (KITS) ×1 IMPLANT
NS IRRIG 1000ML POUR BTL (IV SOLUTION) ×1 IMPLANT
PACK CV ACCESS (CUSTOM PROCEDURE TRAY) ×1 IMPLANT
PAD ARMBOARD 7.5X6 YLW CONV (MISCELLANEOUS) ×2 IMPLANT
SLING ARM FOAM STRAP LRG (SOFTGOODS) IMPLANT
SLING ARM FOAM STRAP MED (SOFTGOODS) IMPLANT
SPIKE FLUID TRANSFER (MISCELLANEOUS) ×1 IMPLANT
SUT MNCRL AB 4-0 PS2 18 (SUTURE) ×1 IMPLANT
SUT PROLENE 6 0 BV (SUTURE) ×1 IMPLANT
SUT PROLENE 7 0 BV 1 (SUTURE) IMPLANT
SUT SILK 2 0 SH (SUTURE) IMPLANT
SUT SILK 3 0 SH CR/8 (SUTURE) ×1 IMPLANT
SUT VIC AB 3-0 SH 27 (SUTURE) ×1
SUT VIC AB 3-0 SH 27X BRD (SUTURE) ×1 IMPLANT
TOWEL GREEN STERILE (TOWEL DISPOSABLE) ×1 IMPLANT
TUBE CONNECTING 20X1/4 (TUBING) IMPLANT
UNDERPAD 30X36 HEAVY ABSORB (UNDERPADS AND DIAPERS) ×1 IMPLANT
WATER STERILE IRR 1000ML POUR (IV SOLUTION) ×1 IMPLANT

## 2022-11-11 NOTE — Op Note (Signed)
    NAME: Jason Beltran    MRN: [id number] DOB: 11-Apr-1941    DATE OF OPERATION: 11/11/2022  PREOP DIAGNOSIS:    Chronic kidney disease stage 5  POSTOP DIAGNOSIS:    Same  PROCEDURE:    Left arm brachiocephalic fistula  SURGEON: Broadus John  ASSIST: None  ANESTHESIA: Moderate, block   EBL: 22m  INDICATIONS:    Jason Beltran a 82y.o. male who presents with chronic kidney disease stage 5   Based on vein mapping and examination, WCameowould be best served with left arm brachiocephalic fistula creation.  Branching was demonstrated on ultrasound so we also discussed the possibility of needing further procedures such as branch ligation should the fistula mature appropriately. I had an extensive discussion with this patient in regards to the nature of access surgery, including risk, benefits, and alternatives.   The patient is aware that the risks of access surgery include but are not limited to: bleeding, infection, steal syndrome, nerve damage, ischemic monomelic neuropathy, failure of access to mature, complications related to venous hypertension, and possible need for additional access procedures in the future. After discussing the risk and benefits, Jason Beltran to proceed.  This will be scheduled at his convenience.  FINDINGS:    436mcephalic vein  TECHNIQUE:   The patient was brought to the operating room and placed in supine position. The left arm was prepped and draped in standard fashion. IV antibiotics were administered. A timeout was performed.   The case began with ultrasound insonation of the brachial artery and cephalic vein, which demonstrated sufficient size at the antecubital fossa for arteriovenous fistula.   A transverse incision was made below the elbow creese in the antecubital fossa. The  cephalic vein was isolated for 3 cm in length. Next the aponeurosis was partially released and the brachial artery secured with a vessel loop. The patient was  heparinized. The cephalic vein was transected and ligated distally with a 2-0 silk stick-tie. The vein was dilated with coronary dilators and flushed with heparin saline. Vascular clamps were placed proximally and distally on the brachial artery and a 5 mm arteriotomy was created on the brachial artery. This was flushed with heparin saline.  An anastomosis was created in end to side fashion on the brachial artery using running 6-0 Prolene suture.  Prior to completing the anastomosis, the vessels were flushed and the suture line was tied down. There was an excellent thrill in the cephalic vein from the anastomosis to the mid upper bicipital region. The patient had a multiphasic radial and ulnar signal. He had an excellent doppler signal in the fistula. The incision was irrigated and hemostasis achieved with cautery and suture. The deeper tissue was closed with 3-0 Vicryl and the skin closed with 4-0 Monocryl.  Dermabond was applied to the incisions. The patient was transferred to PACU in stable condition.  Pt may require future branch ligation.   JoMacie BurowsMD [facility]:   11/11/2022

## 2022-11-11 NOTE — Anesthesia Postprocedure Evaluation (Signed)
Anesthesia Post Note  Patient: Jason Beltran  Procedure(s) Performed: LEFT ARM BRACHIOCEPHALIC FISTULA CREATION (Left: Arm Upper)     Patient location during evaluation: PACU Anesthesia Type: Regional and MAC Level of consciousness: awake and alert Pain management: pain level controlled Vital Signs Assessment: post-procedure vital signs reviewed and stable Respiratory status: spontaneous breathing, nonlabored ventilation and respiratory function stable Cardiovascular status: blood pressure returned to baseline and stable Postop Assessment: no apparent nausea or vomiting Anesthetic complications: no  No notable events documented.  Last Vitals:  Vitals:   11/11/22 0909 11/11/22 0924  BP: (!) 130/42 (!) 143/41  Pulse: (!) 56 (!) 53  Resp: 12 11  Temp:  (!) 36.4 C  SpO2: 95% 95%    Last Pain:  Vitals:   11/11/22 0854  TempSrc:   PainSc: 0-No pain                 Pervis Hocking

## 2022-11-11 NOTE — Discharge Instructions (Signed)
° °  [facility] ° °Discharge Instructions ° °AV Fistula or Graft Surgery for Dialysis Access ° °Please refer to the following instructions for your post-procedure care. Your surgeon or physician assistant will discuss any changes with you. ° °Activity ° °You may drive the day following your surgery, if you are comfortable and no longer taking prescription pain medication. Resume full activity as the soreness in your incision resolves. ° °Bathing/Showering ° °You may shower after you go home. Keep your incision dry for 48 hours. Do not soak in a bathtub, hot tub, or swim until the incision heals completely. You may not shower if you have a hemodialysis catheter. ° °Incision Care ° °Clean your incision with mild soap and water after 48 hours. Pat the area dry with a clean towel. You do not need a bandage unless otherwise instructed. Do not apply any ointments or creams to your incision. You may have skin glue on your incision. Do not peel it off. It will come off on its own in about one week. Your arm may swell a bit after surgery. To reduce swelling use pillows to elevate your arm so it is above your heart. Your doctor will tell you if you need to lightly wrap your arm with an ACE bandage. ° °Diet ° °Resume your normal diet. There are not special food restrictions following this procedure. In order to heal from your surgery, it is CRITICAL to get adequate nutrition. Your body requires vitamins, minerals, and protein. Vegetables are the best source of vitamins and minerals. Vegetables also provide the perfect balance of protein. Processed food has little nutritional value, so try to avoid this. ° °Medications ° °Resume taking all of your medications. If your incision is causing pain, you may take over-the counter pain relievers such as acetaminophen (Tylenol). If you were prescribed a stronger pain medication, please be aware these medications can cause nausea and constipation. Prevent  nausea by taking the medication with a snack or meal. Avoid constipation by drinking plenty of fluids and eating foods with high amount of fiber, such as fruits, vegetables, and grains. Do not take Tylenol if you are taking prescription pain medications. ° ° ° ° °Follow up °Your surgeon may want to see you in the office following your access surgery. If so, this will be arranged at the time of your surgery. ° °Please call us immediately for any of the following conditions: ° °Increased pain, redness, drainage (pus) from your incision site °Fever of 101 degrees or higher °Severe or worsening pain at your incision site °Hand pain or numbness. ° °Reduce your risk of vascular disease: ° °Stop smoking. If you would like help, call [facility] at 1-800-QUIT-NOW (1-800-784-8669) or [facility] at 336-586-4000 ° °Manage your cholesterol °Maintain a desired weight °Control your diabetes °Keep your blood pressure down ° °Dialysis ° °It will take several weeks to several months for your new dialysis access to be ready for use. Your surgeon will determine when it is OK to use it. Your nephrologist will continue to direct your dialysis. You can continue to use your Permcath until your new access is ready for use. ° °If you have any questions, please call the office at [phone]. ° °

## 2022-11-11 NOTE — H&P (Signed)
Office Note   Patient seen and examined in preop holding.  No complaints. No changes to medication history or physical exam since last seen in clinic. After discussing the risks and benefits of left arm fistula creation, Jason Beltran elected to proceed.   Jason John MD   CC:  ESRD Requesting Provider:  No ref. provider found  HPI: Jason Beltran is a Right handed 82 y.o. (1941-07-13) male with kidney disease who presents at the request of No ref. provider found for permanent HD access. The patient has had no prior access procedures.   On exam today, Jason Beltran was doing well, accompanied by his wife. 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  He has 2 sons, both living in [place]. Jason Beltran is also a historian and has been future in [place].  His most notable work is '[employer] [place]', which is now out of prep and can be purchased on [employer].  Jason Beltran's kidney function has declined over the last few years.  He will likely require dialysis in the coming months.   The pt is  on a statin for cholesterol management.  The pt is  on a daily aspirin.   Other AC:  - The pt is  on medications for hypertension.   The pt is not diabetic. Tobacco hx:  none  Past Medical History:  Diagnosis Date   Anemia of chronic disease    Aortic stenosis 05/08/2018   Very mild mean 9 mm Hg   Chronic sinusitis    CKD (chronic kidney disease)    Diabetes mellitus type 2, uncontrolled    Diastolic CHF ([place])    Diastolic dysfunction [id number]   Essential hypertension 05/06/2018   Heart murmur    Hyperparathyroidism ([facility])    Hypertension    Neuropathy, peripheral    Obesity    Type 2 diabetes mellitus ([place]) 05/06/2018    Past Surgical History:  Procedure Laterality Date   ANKLE SURGERY Right 01/20/2012    NASAL SINUS SURGERY     VASECTOMY      Social History   Socioeconomic History   Marital status: Married    Spouse name: Not on file   Number of children: Not on file   Years of education: Not on file   Highest education level: Not on file  Occupational History   Not on file  Tobacco Use   Smoking status: Never   Smokeless tobacco: Never  Vaping Use   Vaping Use: Never used  Substance and Sexual Activity   Alcohol use: Yes    Comment: occasional glass of wine   Drug use: Never   Sexual activity: Not on file  Other Topics Concern   Not on file  Social History Narrative   Not on file   Social Determinants of Health   Financial Resource Strain: Not on file  Food Insecurity: Not on file  Transportation Needs: Not on file  Physical Activity: Not on file  Stress: Not on file  Social Connections: Not on file  Intimate Partner Violence: Not on file   Family History  Problem Relation Age of Onset   Polymyalgia rheumatica Mother    Congestive Heart Failure Mother    Heart failure Father    Stroke Paternal Grandmother    Stroke Paternal Aunt  Current Facility-Administered Medications  Medication Dose Route Frequency Provider Last Rate Last Admin   0.9 %  sodium chloride infusion   Intravenous Continuous Jason John, MD       ceFAZolin (ANCEF) IVPB 2g/100 mL premix  2 g Intravenous 30 min Pre-Op Jason John, MD       chlorhexidine (HIBICLENS) 4 % liquid 4 Application  60 mL Topical Once Jason John, MD       And   [START ON 11/12/2022] chlorhexidine (HIBICLENS) 4 % liquid 4 Application  60 mL Topical Once Jason John, MD       insulin aspart (novoLOG) injection 0-7 Units  0-7 Units Subcutaneous Q2H PRN Pervis Hocking, DO        Allergies  Allergen Reactions   Actos [Pioglitazone] Other (See Comments)    Contraindicated by cardiomyopathy   Lisinopril Other (See Comments)    Antihypertensives and ARB's discouraged by Nephrology   Motrin  [Ibuprofen]     Prefers not to take   Trilipix [Choline Fenofibrate] Other (See Comments)    Contributed to elevated LFT's     REVIEW OF SYSTEMS:  '[X]'$  denotes positive finding, '[ ]'$  denotes negative finding Cardiac  Comments:  Chest pain or chest pressure:    Shortness of breath upon exertion:    Short of breath when lying flat:    Irregular heart rhythm:        Vascular    Pain in calf, thigh, or hip brought on by ambulation:    Pain in feet at night that wakes you up from your sleep:     Blood clot in your veins:    Leg swelling:         Pulmonary    Oxygen at home:    Productive cough:     Wheezing:         Neurologic    Sudden weakness in arms or legs:     Sudden numbness in arms or legs:     Sudden onset of difficulty speaking or slurred speech:    Temporary loss of vision in one eye:     Problems with dizziness:         Gastrointestinal    Blood in stool:     Vomited blood:         Genitourinary    Burning when urinating:     Blood in urine:        Psychiatric    Major depression:         Hematologic    Bleeding problems:    Problems with blood clotting too easily:        Skin    Rashes or ulcers:        Constitutional    Fever or chills:      PHYSICAL EXAMINATION:  Vitals:   11/07/22 1834 11/11/22 0629  BP:  (!) 176/81  Pulse:  (!) 56  Resp:  16  Temp:  97.7 F (36.5 C)  TempSrc:  Oral  SpO2:  96%  Weight: 81.6 kg 82.1 kg  Height: '5\' 8"'$  (1.727 m) '5\' 8"'$  (1.727 m)    General:  WDWN in NAD; vital signs documented above Gait: Not observed HENT: WNL, normocephalic Pulmonary: normal non-labored breathing , without Rales, rhonchi,  wheezing Cardiac: regular HR, Abdomen: soft, NT, no masses Skin: without rashes Vascular Exam/Pulses:  Right Left  Radial 2+ (normal) 2+ (normal)  Ulnar 2+ (normal) 2+ (normal)  Extremities: without ischemic changes, without Gangrene , without cellulitis; without open wounds;   Musculoskeletal: no muscle wasting or atrophy  Neurologic: A&O X 3;  No focal weakness or paresthesias are detected Psychiatric:  The pt has Normal affect.   Non-Invasive Vascular Imaging:   +-----------------+-------------+----------+---------+  Right Cephalic   Diameter (cm)Depth (cm)Findings   +-----------------+-------------+----------+---------+  Shoulder            0.27                          +-----------------+-------------+----------+---------+  Prox upper arm       0.28                          +-----------------+-------------+----------+---------+  Mid upper arm     0.31 / 0.19           branching  +-----------------+-------------+----------+---------+  Dist upper arm       0.24                          +-----------------+-------------+----------+---------+  Antecubital fossa    0.28                          +-----------------+-------------+----------+---------+  Prox forearm         0.46                          +-----------------+-------------+----------+---------+  Mid forearm       0.25 / 0.27                      +-----------------+-------------+----------+---------+  Dist forearm         0.24                          +-----------------+-------------+----------+---------+   +-----------------+-------------+----------+--------------+  Right Basilic    Diameter (cm)Depth (cm)   Findings     +-----------------+-------------+----------+--------------+  Shoulder                               not visualized  +-----------------+-------------+----------+--------------+  Prox upper arm                          not visualized  +-----------------+-------------+----------+--------------+  Mid upper arm        0.42                               +-----------------+-------------+----------+--------------+  Dist upper arm       0.31                                +-----------------+-------------+----------+--------------+  Antecubital fossa    0.26                               +-----------------+-------------+----------+--------------+  Prox forearm         0.20                               +-----------------+-------------+----------+--------------+   +-----------------+------------------+----------+---------+  Left Cephalic      Diameter (cm)   Depth (cm)Findings   +-----------------+------------------+----------+---------+  Shoulder               0.27                            +-----------------+------------------+----------+---------+  Prox upper arm          0.25                            +-----------------+------------------+----------+---------+  Mid upper arm    0.36 / 0.30 / 0.22          branching  +-----------------+------------------+----------+---------+  Dist upper arm          0.34                            +-----------------+------------------+----------+---------+  Antecubital fossa       0.33                            +-----------------+------------------+----------+---------+  Prox forearm            0.28                            +-----------------+------------------+----------+---------+  Mid forearm             0.27                            +-----------------+------------------+----------+---------+  Dist forearm            0.28                            +-----------------+------------------+----------+---------+   +-----------------+-------------+----------+--------------+  Left Basilic     Diameter (cm)Depth (cm)   Findings     +-----------------+-------------+----------+--------------+  Shoulder                               not visualized  +-----------------+-------------+----------+--------------+  Prox upper arm                          not visualized  +-----------------+-------------+----------+--------------+  Mid upper arm         0.36                               +-----------------+-------------+----------+--------------+  Dist upper arm       0.23                               +-----------------+-------------+----------+--------------+  Antecubital fossa 0.22 / 0.18             branching     +-----------------+-------------+----------+--------------+  Prox forearm         0.12                               +-----------------+-------------+----------+--------------+     ASSESSMENT/PLAN:  Jason Beltran is a 82 y.o. male who presents with chronic kidney disease stage  5  Based on vein mapping and examination, Jason Beltran would be best served with left arm brachiocephalic fistula creation.  Branching was demonstrated on ultrasound so we also discussed the possibility of needing further procedures such as branch ligation should the fistula mature appropriately. I had an extensive discussion with this patient in regards to the nature of access surgery, including risk, benefits, and alternatives.   The patient is aware that the risks of access surgery include but are not limited to: bleeding, infection, steal syndrome, nerve damage, ischemic monomelic neuropathy, failure of access to mature, complications related to venous hypertension, and possible need for additional access procedures in the future. After discussing the risk and benefits, Jason Beltran elected to proceed.  This will be scheduled at his convenience.  Jason John, MD [facility] [phone] Total time of patient care including pre-visit research, consultation, and documentation greater than 45 minutes

## 2022-11-11 NOTE — Transfer of Care (Signed)
Immediate Anesthesia Transfer of Care Note  Patient: Donnal Fasciano  Procedure(s) Performed: LEFT ARM BRACHIOCEPHALIC FISTULA CREATION (Left: Arm Upper)  Patient Location: PACU  Anesthesia Type:MAC combined with regional for post-op pain  Level of Consciousness: awake and alert   Airway & Oxygen Therapy: Patient Spontanous Breathing and Patient connected to nasal cannula oxygen  Post-op Assessment: Report given to RN and Post -op Vital signs reviewed and stable  Post vital signs: Reviewed and stable  Last Vitals:  Vitals Value Taken Time  BP 114/60   Temp    Pulse 59 11/11/22 0853  Resp 17 11/11/22 0853  SpO2 94 % 11/11/22 0853  Vitals shown include unvalidated device data.  Last Pain:  Vitals:   11/11/22 0629  TempSrc: Oral  PainSc:          Complications: No notable events documented.

## 2022-11-11 NOTE — Anesthesia Procedure Notes (Signed)
Anesthesia Regional Block: Supraclavicular block   Pre-Anesthetic Checklist: , timeout performed,  Correct Patient, Correct Site, Correct Laterality,  Correct Procedure, Correct Position, site marked,  Risks and benefits discussed,  Surgical consent,  Pre-op evaluation,  At surgeon's request and post-op pain management  Laterality: Left  Prep: Maximum Sterile Barrier Precautions used, chloraprep       Needles:  Injection technique: Single-shot  Needle Type: Echogenic Stimulator Needle     Needle Length: 9cm  Needle Gauge: 22     Additional Needles:   Procedures:,,,, ultrasound used (permanent image in chart),,    Narrative:  Start time: 11/11/2022 7:10 AM End time: 11/11/2022 7:15 AM Injection made incrementally with aspirations every 5 mL.  Performed by: Personally  Anesthesiologist: Pervis Hocking, DO  Additional Notes: Monitors applied. No increased pain on injection. No increased resistance to injection. Injection made in 5cc increments. Good needle visualization. Patient tolerated procedure well.

## 2022-11-12 ENCOUNTER — Encounter (HOSPITAL_COMMUNITY): Payer: Self-pay | Admitting: Vascular Surgery

## 2022-11-13 DIAGNOSIS — N185 Chronic kidney disease, stage 5: Secondary | ICD-10-CM | POA: Diagnosis not present

## 2022-11-17 ENCOUNTER — Telehealth: Payer: Self-pay

## 2022-11-17 DIAGNOSIS — N185 Chronic kidney disease, stage 5: Secondary | ICD-10-CM | POA: Diagnosis not present

## 2022-11-17 DIAGNOSIS — D631 Anemia in chronic kidney disease: Secondary | ICD-10-CM | POA: Diagnosis not present

## 2022-11-17 NOTE — Telephone Encounter (Signed)
Pt called with c/o intermittent L hand/arm numbness since surgery. He denies swelling and states incision looks good. APP was made aware. Pt will continue to elevate LUE and monitor how he is feeling over the next few days. He will call us back if things do not improve or if anything changes. Pt verbalized understanding.

## 2022-11-18 DIAGNOSIS — I5189 Other ill-defined heart diseases: Secondary | ICD-10-CM | POA: Diagnosis not present

## 2022-11-18 DIAGNOSIS — I120 Hypertensive chronic kidney disease with stage 5 chronic kidney disease or end stage renal disease: Secondary | ICD-10-CM | POA: Diagnosis not present

## 2022-11-18 DIAGNOSIS — D631 Anemia in chronic kidney disease: Secondary | ICD-10-CM | POA: Diagnosis not present

## 2022-11-18 DIAGNOSIS — E872 Acidosis, unspecified: Secondary | ICD-10-CM | POA: Diagnosis not present

## 2022-11-18 DIAGNOSIS — N2581 Secondary hyperparathyroidism of renal origin: Secondary | ICD-10-CM | POA: Diagnosis not present

## 2022-11-18 DIAGNOSIS — E1122 Type 2 diabetes mellitus with diabetic chronic kidney disease: Secondary | ICD-10-CM | POA: Diagnosis not present

## 2022-11-18 DIAGNOSIS — N185 Chronic kidney disease, stage 5: Secondary | ICD-10-CM | POA: Diagnosis not present

## 2022-12-01 DIAGNOSIS — N185 Chronic kidney disease, stage 5: Secondary | ICD-10-CM | POA: Diagnosis not present

## 2022-12-01 DIAGNOSIS — D631 Anemia in chronic kidney disease: Secondary | ICD-10-CM | POA: Diagnosis not present

## 2022-12-07 NOTE — Progress Notes (Unsigned)
Cardiology Office Note:    Date:  12/10/2022   ID:  Jason Beltran, DOB 1941/09/12, MRN [id number]  PCP:  Angelina Sheriff, MD  Cardiologist:  Shirlee More, MD    Referring MD: Angelina Sheriff, MD    ASSESSMENT:    1. Hypertensive heart disease with chronic combined systolic and diastolic congestive heart failure ([place])   2. CKD (chronic kidney disease) stage 5, GFR less than 15 ml/min (HCC)   3. Anemia due to stage 4 chronic kidney disease (HCC)    PLAN:    In order of problems listed above:  I know Jason Beltran quite well socially he is doing well and improved quality of life anxious about the initiation of hemodialysis asked me to describe a temporary access not tomograms needed it can be done easily and would be effective and carry him into a mature suspicion 2. Hypertension is well-controlled he has no evidence of decompensated heart failure we will continue his current loop diuretic Severe case CKD awaiting renal replacement therapy followed by nephrology [facility] Improved with iron infusion He tells me he tells me his hemoglobin is above 10  Next appointment: 6 months   Medication Adjustments/Labs and Tests Ordered: Current medicines are reviewed at length with the patient today.  Concerns regarding medicines are outlined above.  No orders of the defined types were placed in this encounter.  No orders of the defined types were placed in this encounter.  Follow-up for heart failure   History of Present Illness:    Jason Beltran is a 82 y.o. male with a hx of CKD progressing to stage V hypertensive heart disease with heart failure combined systolic and diastolic and gout last seen 06/10/2022.  Compliance with diet, lifestyle and medications: Yes  He is anxiously awaiting the initiation of hemodialysis and follows with the vein surgeons he has a fistula with maturing Cardiology perspective he is doing well he is not having edema shortness of breath chest pain  palpitation or syncope and feels much better since improvement of his anemia related to chronic kidney disease with iron infusion Past Medical History:  Diagnosis Date   Anemia of chronic disease    Aortic stenosis 05/08/2018   Very mild mean 9 mm Hg   Chronic sinusitis    CKD (chronic kidney disease)    Diabetes mellitus type 2, uncontrolled    Diastolic CHF ([place])    Diastolic dysfunction [id number]   Essential hypertension 05/06/2018   Heart murmur    Hyperparathyroidism ([place])    Hypertension    Neuropathy, peripheral    Obesity    Type 2 diabetes mellitus ([place]) 05/06/2018    Past Surgical History:  Procedure Laterality Date   ANKLE SURGERY Right 01/20/2012   AV FISTULA PLACEMENT Left 11/11/2022   Procedure: LEFT ARM BRACHIOCEPHALIC FISTULA CREATION;  Surgeon: Broadus John, MD;  Location: [facility] OR;  Service: Vascular;  Laterality: Left;   NASAL SINUS SURGERY     VASECTOMY      Current Medications: Current Meds  Medication Sig   ALOE VERA PO Take 200 mg by mouth daily.   amLODipine (NORVASC) 5 MG tablet Take 5 mg by mouth daily.   aspirin 81 MG EC tablet Take 81 mg by mouth every evening.   Biotin 5000 MCG TABS Take 5,000 mcg by mouth at bedtime.   carvedilol (COREG) 6.25 MG tablet Take 6.25 mg by mouth 2 (two) times daily with a meal.   cetirizine (ZYRTEC) 10 MG  tablet Take 10 mg by mouth daily as needed for allergies.   Cholecalciferol (VITAMIN D3) 50 MCG (2000 UT) TABS Take 2,000 Units by mouth daily.   Coenzyme Q10 (COQ10) 200 MG CAPS Take 200 mg by mouth every evening.   febuxostat (ULORIC) 40 MG tablet Take 20 mg by mouth daily.   gabapentin (NEURONTIN) 400 MG capsule Take 1 capsule (400 mg total) by mouth at bedtime. (Patient taking differently: Take 400 mg by mouth 2 (two) times daily.)   Glucosamine-Chondroit-Vit C-Mn (GLUCOSAMINE 1500 COMPLEX) CAPS Take 1 capsule by mouth daily.   hydrALAZINE (APRESOLINE) 50 MG tablet Take 50 mg by mouth in the morning and at  bedtime.   HYDROcodone-acetaminophen (NORCO) 5-325 MG tablet Take 1 tablet by mouth every 6 (six) hours as needed for moderate pain.   ipratropium (ATROVENT) 0.06 % nasal spray Place 2 sprays into both nostrils daily as needed for rhinitis.   Multiple Vitamin (MULTIVITAMIN) tablet Take 1 tablet by mouth daily.   Resveratrol-Quercetin (RESVERATROL PLUS) 100-100 MG TABS Take 1 capsule by mouth daily.   Saw Palmetto 450 MG CAPS Take 1,350 mg by mouth daily.   simvastatin (ZOCOR) 20 MG tablet Take 20 mg by mouth every evening.   torsemide (DEMADEX) 20 MG tablet Take 1 tablet (20 mg total) by mouth 2 (two) times daily.   Turmeric (QC TUMERIC COMPLEX PO) Take 1 tablet by mouth in the morning and at bedtime.     Allergies:   Actos [pioglitazone], Lisinopril, Motrin [ibuprofen], and Trilipix [choline fenofibrate]   Social History   Socioeconomic History   Marital status: Married    Spouse name: Not on file   Number of children: Not on file   Years of education: Not on file   Highest education level: Not on file  Occupational History   Not on file  Tobacco Use   Smoking status: Never   Smokeless tobacco: Never  Vaping Use   Vaping Use: Never used  Substance and Sexual Activity   Alcohol use: Yes    Comment: occasional glass of wine   Drug use: Never   Sexual activity: Not on file  Other Topics Concern   Not on file  Social History Narrative   Not on file   Social Determinants of Health   Financial Resource Strain: Not on file  Food Insecurity: Not on file  Transportation Needs: Not on file  Physical Activity: Not on file  Stress: Not on file  Social Connections: Not on file     Family History: The patient's family history includes Congestive Heart Failure in his mother; Heart failure in his father; Polymyalgia rheumatica in his mother; Stroke in his paternal aunt and paternal grandmother. ROS:   Please see the history of present illness.    All other systems reviewed and  are negative.  EKGs/Labs/Other Studies Reviewed:    The following studies were reviewed today:  Cardiac Studies & Procedures       ECHOCARDIOGRAM  ECHOCARDIOGRAM COMPLETE 01/08/2022  Narrative ECHOCARDIOGRAM REPORT    Patient Name:   Jason Beltran Date of Exam: 01/08/2022 Medical Rec #:  [id number]    Height:       68.0 in Accession #:    [id number]   Weight:       184.0 lb Date of Birth:  11-12-1940    BSA:          1.973 m Patient Age:    2 years     BP:  119/65 mmHg Patient Gender: M            HR:           72 bpm. Exam Location:  Inpatient  Procedure: 2D Echo, Cardiac Doppler and Color Doppler  Indications:    CHF  History:        Patient has prior history of Echocardiogram examinations, most recent 07/24/2021. CHF; Risk Factors:Hypertension and Diabetes.  Sonographer:    Jefferey Pica Referring Phys: [id number] [place]   1. Mild anterolateral and posterolateral hypokinesis. Left ventricular ejection fraction, by estimation, is 40 to 45%. The left ventricle has mildly decreased function. The left ventricle demonstrates regional wall motion abnormalities (see scoring diagram/findings for description). Left ventricular diastolic parameters are consistent with Grade I diastolic dysfunction (impaired relaxation). 2. Right ventricular systolic function is normal. The right ventricular size is normal. There is normal pulmonary artery systolic pressure. 3. Left atrial size was severely dilated. 4. Right atrial size was mildly dilated. 5. The mitral valve is normal in structure. Trivial mitral valve regurgitation. No evidence of mitral stenosis. 6. The aortic valve is tricuspid. There is mild calcification of the aortic valve. Aortic valve regurgitation is not visualized. No aortic stenosis is present. 7. The inferior vena cava is normal in size with greater than 50% respiratory variability, suggesting right atrial pressure of 3  mmHg.  FINDINGS Left Ventricle: Mild anterolateral and posterolateral hypokinesis. Left ventricular ejection fraction, by estimation, is 40 to 45%. The left ventricle has mildly decreased function. The left ventricle demonstrates regional wall motion abnormalities. The left ventricular internal cavity size was normal in size. There is no left ventricular hypertrophy. Left ventricular diastolic parameters are consistent with Grade I diastolic dysfunction (impaired relaxation). Indeterminate filling pressures.  Right Ventricle: The right ventricular size is normal. No increase in right ventricular wall thickness. Right ventricular systolic function is normal. There is normal pulmonary artery systolic pressure. The tricuspid regurgitant velocity is 2.00 m/s, and with an assumed right atrial pressure of 3 mmHg, the estimated right ventricular systolic pressure is Q000111Q mmHg.  Left Atrium: Left atrial size was severely dilated.  Right Atrium: Right atrial size was mildly dilated.  Pericardium: There is no evidence of pericardial effusion.  Mitral Valve: The mitral valve is normal in structure. Trivial mitral valve regurgitation. No evidence of mitral valve stenosis.  Tricuspid Valve: The tricuspid valve is normal in structure. Tricuspid valve regurgitation is trivial. No evidence of tricuspid stenosis.  Aortic Valve: The aortic valve is tricuspid. There is mild calcification of the aortic valve. Aortic valve regurgitation is not visualized. No aortic stenosis is present. Aortic valve peak gradient measures 18.2 mmHg.  Pulmonic Valve: The pulmonic valve was normal in structure. Pulmonic valve regurgitation is not visualized. No evidence of pulmonic stenosis.  Aorta: The aortic root is normal in size and structure.  Venous: The inferior vena cava is normal in size with greater than 50% respiratory variability, suggesting right atrial pressure of 3 mmHg.  IAS/Shunts: No atrial level shunt detected  by color flow Doppler.   LEFT VENTRICLE PLAX 2D LVIDd:         5.20 cm      Diastology LVIDs:         4.40 cm      LV e' medial:    7.07 cm/s LV PW:         1.10 cm      LV E/e' medial:  12.2 LV IVS:  1.00 cm      LV e' lateral:   8.38 cm/s LVOT diam:     2.10 cm      LV E/e' lateral: 10.3 LV SV:         88 LV SV Index:   45 LVOT Area:     3.46 cm  LV Volumes (MOD) LV vol d, MOD A4C: 181.0 ml LV vol s, MOD A4C: 111.0 ml LV SV MOD A4C:     181.0 ml  RIGHT VENTRICLE             IVC RV Basal diam:  3.00 cm     IVC diam: 1.70 cm RV S prime:     14.00 cm/s TAPSE (M-mode): 2.9 cm  LEFT ATRIUM             Index        RIGHT ATRIUM           Index LA diam:        4.20 cm 2.13 cm/m   RA Area:     19.80 cm LA Vol (A2C):   88.8 ml 45.02 ml/m  RA Volume:   62.00 ml  31.43 ml/m LA Vol (A4C):   75.5 ml 38.27 ml/m LA Biplane Vol: 82.2 ml 41.67 ml/m AORTIC VALVE                 PULMONIC VALVE AV Area (Vmax): 2.03 cm     PV Vmax:       0.96 m/s AV Vmax:        213.50 cm/s  PV Peak grad:  3.7 mmHg AV Peak Grad:   18.2 mmHg LVOT Vmax:      125.00 cm/s LVOT Vmean:     72.400 cm/s LVOT VTI:       0.254 m  AORTA Ao Root diam: 3.10 cm Ao Asc diam:  3.40 cm  MITRAL VALVE               TRICUSPID VALVE MV Area (PHT): 5.31 cm    TR Peak grad:   16.0 mmHg MV Decel Time: 143 msec    TR Vmax:        200.00 cm/s MV E velocity: 86.40 cm/s MV A velocity: 97.60 cm/s  SHUNTS MV E/A ratio:  0.89        Systemic VTI:  0.25 m Systemic Diam: 2.10 cm  Skeet Latch MD Electronically signed by Skeet Latch MD Signature Date/Time: 01/08/2022/2:01:55 PM    Final             EKG:  EKG ordered today and personally reviewed.  The ekg ordered today demonstrates sinus rhythm she has APCs otherwise normal EKG  Recent Labs: 01/07/2022: B Natriuretic Peptide 1,019.3 01/09/2022: Platelets 129 11/11/2022: BUN 93; Creatinine, Ser 6.10; Hemoglobin 9.5; Potassium 4.1; Sodium 141  Recent  Lipid Panel No results found for: "CHOL", "TRIG", "HDL", "CHOLHDL", "VLDL", "LDLCALC", "LDLDIRECT"  Physical Exam:    VS:  BP (!) 152/50 (BP Location: Right Arm, Patient Position: Sitting)   Pulse 63   Ht 5\' 8"  (1.727 m)   Wt 189 lb (85.7 kg)   SpO2 99%   BMI 28.74 kg/m     Wt Readings from Last 3 Encounters:  12/10/22 189 lb (85.7 kg)  11/11/22 181 lb (82.1 kg)  10/31/22 195 lb (88.5 kg)     GEN:  Well nourished, well developed in no acute distress HEENT: Normal NECK: No JVD; No carotid bruits LYMPHATICS: No lymphadenopathy  CARDIAC: RRR, no murmurs, rubs, gallops RESPIRATORY:  Clear to auscultation without rales, wheezing or rhonchi  ABDOMEN: Soft, non-tender, non-distended MUSCULOSKELETAL:  No edema; No deformity  SKIN: Warm and dry NEUROLOGIC:  Alert and oriented x 3 PSYCHIATRIC:  Normal affect    Signed, Shirlee More, MD  12/10/2022 2:41 PM    [facility]

## 2022-12-10 ENCOUNTER — Ambulatory Visit: Payer: Medicare Other | Attending: Cardiology | Admitting: Cardiology

## 2022-12-10 ENCOUNTER — Encounter: Payer: Self-pay | Admitting: Cardiology

## 2022-12-10 VITALS — BP 152/50 | HR 63 | Ht 68.0 in | Wt 189.0 lb

## 2022-12-10 DIAGNOSIS — N185 Chronic kidney disease, stage 5: Secondary | ICD-10-CM | POA: Diagnosis not present

## 2022-12-10 DIAGNOSIS — D631 Anemia in chronic kidney disease: Secondary | ICD-10-CM | POA: Diagnosis not present

## 2022-12-10 DIAGNOSIS — I110 Hypertensive heart disease with heart failure: Secondary | ICD-10-CM | POA: Insufficient documentation

## 2022-12-10 DIAGNOSIS — N184 Chronic kidney disease, stage 4 (severe): Secondary | ICD-10-CM | POA: Diagnosis not present

## 2022-12-10 DIAGNOSIS — I5042 Chronic combined systolic (congestive) and diastolic (congestive) heart failure: Secondary | ICD-10-CM | POA: Diagnosis not present

## 2022-12-10 NOTE — Patient Instructions (Signed)
Medication Instructions:  Your physician recommends that you continue on your current medications as directed. Please refer to the Current Medication list given to you today.  *If you need a refill on your cardiac medications before your next appointment, please call your pharmacy*   Lab Work: None If you have labs (blood work) drawn today and your tests are completely normal, you will receive your results only by: MyChart Message (if you have MyChart) OR A paper copy in the mail If you have any lab test that is abnormal or we need to change your treatment, we will call you to review the results.   Testing/Procedures: None   Follow-Up: At [facility], you and your health needs are our priority.  As part of our continuing mission to provide you with exceptional heart care, we have created designated Provider Care Teams.  These Care Teams include your primary Cardiologist (physician) and Advanced Practice Providers (APPs -  Physician Assistants and Nurse Practitioners) who all work together to provide you with the care you need, when you need it.  We recommend signing up for the patient portal called "MyChart".  Sign up information is provided on this After Visit Summary.  MyChart is used to connect with patients for Virtual Visits (Telemedicine).  Patients are able to view lab/test results, encounter notes, upcoming appointments, etc.  Non-urgent messages can be sent to your provider as well.   To learn more about what you can do with MyChart, go to [email or web address].    Your next appointment:   6 month(s)  Provider:   Brian Munley, MD    Other Instructions None  

## 2022-12-11 DIAGNOSIS — L565 Disseminated superficial actinic porokeratosis (DSAP): Secondary | ICD-10-CM | POA: Diagnosis not present

## 2022-12-11 DIAGNOSIS — L570 Actinic keratosis: Secondary | ICD-10-CM | POA: Diagnosis not present

## 2022-12-11 DIAGNOSIS — D2272 Melanocytic nevi of left lower limb, including hip: Secondary | ICD-10-CM | POA: Diagnosis not present

## 2022-12-11 DIAGNOSIS — D2271 Melanocytic nevi of right lower limb, including hip: Secondary | ICD-10-CM | POA: Diagnosis not present

## 2022-12-11 DIAGNOSIS — L821 Other seborrheic keratosis: Secondary | ICD-10-CM | POA: Diagnosis not present

## 2022-12-11 DIAGNOSIS — Z85828 Personal history of other malignant neoplasm of skin: Secondary | ICD-10-CM | POA: Diagnosis not present

## 2022-12-15 DIAGNOSIS — D631 Anemia in chronic kidney disease: Secondary | ICD-10-CM | POA: Diagnosis not present

## 2022-12-15 DIAGNOSIS — N185 Chronic kidney disease, stage 5: Secondary | ICD-10-CM | POA: Diagnosis not present

## 2022-12-17 DIAGNOSIS — E1122 Type 2 diabetes mellitus with diabetic chronic kidney disease: Secondary | ICD-10-CM | POA: Diagnosis not present

## 2022-12-17 DIAGNOSIS — E872 Acidosis, unspecified: Secondary | ICD-10-CM | POA: Diagnosis not present

## 2022-12-17 DIAGNOSIS — D631 Anemia in chronic kidney disease: Secondary | ICD-10-CM | POA: Diagnosis not present

## 2022-12-17 DIAGNOSIS — N2581 Secondary hyperparathyroidism of renal origin: Secondary | ICD-10-CM | POA: Diagnosis not present

## 2022-12-17 DIAGNOSIS — I120 Hypertensive chronic kidney disease with stage 5 chronic kidney disease or end stage renal disease: Secondary | ICD-10-CM | POA: Diagnosis not present

## 2022-12-17 DIAGNOSIS — N185 Chronic kidney disease, stage 5: Secondary | ICD-10-CM | POA: Diagnosis not present

## 2022-12-18 ENCOUNTER — Other Ambulatory Visit: Payer: Self-pay | Admitting: *Deleted

## 2022-12-18 DIAGNOSIS — N185 Chronic kidney disease, stage 5: Secondary | ICD-10-CM

## 2022-12-22 DIAGNOSIS — N185 Chronic kidney disease, stage 5: Secondary | ICD-10-CM | POA: Diagnosis not present

## 2022-12-22 DIAGNOSIS — D631 Anemia in chronic kidney disease: Secondary | ICD-10-CM | POA: Diagnosis not present

## 2022-12-23 DIAGNOSIS — D631 Anemia in chronic kidney disease: Secondary | ICD-10-CM | POA: Diagnosis not present

## 2022-12-23 DIAGNOSIS — N189 Chronic kidney disease, unspecified: Secondary | ICD-10-CM | POA: Diagnosis not present

## 2022-12-23 NOTE — Progress Notes (Signed)
POST OPERATIVE OFFICE NOTE    CC:  F/u for surgery  HPI:  This is a 82 y.o. male who is s/p left BC AVF on 11/11/2022 by Dr. Karin Lieuobins.   Pt states he does have some numbness in the left hand.  He states this is tolerable.  He is able to use his hand without difficulty.   [place] is a [place]  He went to school at [employer].  He and his wife have been married for 60 years.   The pt is not yet on dialysis.   Allergies  Allergen Reactions   Actos [Pioglitazone] Other (See Comments)    Contraindicated by cardiomyopathy   Lisinopril Other (See Comments)    Antihypertensives and ARB's discouraged by Nephrology   Motrin [Ibuprofen]     Prefers not to take   Trilipix [Choline Fenofibrate] Other (See Comments)    Contributed to elevated LFT's    Current Outpatient Medications  Medication Sig Dispense Refill   ALOE VERA PO Take 200 mg by mouth daily.     amLODipine (NORVASC) 5 MG tablet Take 5 mg by mouth daily.     aspirin 81 MG EC tablet Take 81 mg by mouth every evening.     Biotin 5000 MCG TABS Take 5,000 mcg by mouth at bedtime.     carvedilol (COREG) 6.25 MG tablet Take 6.25 mg by mouth 2 (two) times daily with a meal.     cetirizine (ZYRTEC) 10 MG tablet Take 10 mg by mouth daily as needed for allergies.     Cholecalciferol (VITAMIN D3) 50 MCG (2000 UT) TABS Take 2,000 Units by mouth daily.     Coenzyme Q10 (COQ10) 200 MG CAPS Take 200 mg by mouth every evening.     febuxostat (ULORIC) 40 MG tablet Take 20 mg by mouth daily.     gabapentin (NEURONTIN) 400 MG capsule Take 1 capsule (400 mg total) by mouth at bedtime. (Patient taking differently: Take 400 mg by mouth 2 (two) times daily.)     Glucosamine-Chondroit-Vit C-Mn (GLUCOSAMINE 1500 COMPLEX) CAPS Take 1 capsule by mouth daily.     hydrALAZINE (APRESOLINE) 50 MG tablet Take 50 mg by mouth in the morning and at bedtime.     HYDROcodone-acetaminophen (NORCO) 5-325 MG tablet Take 1 tablet by mouth every 6 (six)  hours as needed for moderate pain. 10 tablet 0   ipratropium (ATROVENT) 0.06 % nasal spray Place 2 sprays into both nostrils daily as needed for rhinitis.     Multiple Vitamin (MULTIVITAMIN) tablet Take 1 tablet by mouth daily.     Resveratrol-Quercetin (RESVERATROL PLUS) 100-100 MG TABS Take 1 capsule by mouth daily.     Saw Palmetto 450 MG CAPS Take 1,350 mg by mouth daily.     simvastatin (ZOCOR) 20 MG tablet Take 20 mg by mouth every evening.     torsemide (DEMADEX) 20 MG tablet Take 1 tablet (20 mg total) by mouth 2 (two) times daily. 180 tablet 3   Turmeric (QC TUMERIC COMPLEX PO) Take 1 tablet by mouth in the morning and at bedtime.     No current facility-administered medications for this visit.     ROS:  See HPI  Physical Exam:  Incision:  well healed Extremities:   There is a palpable left radial pulse.   Motor and sensory are in tact.   There is a thrill/bruit present.  Access is  easily palpable   Dialysis Duplex on 12/26/2022: Findings:  +--------------------+----------+-----------------+--------+  AVF                PSV (cm/s)Flow Vol (mL/min)Comments  +--------------------+----------+-----------------+--------+  Native artery inflow   380          1337                 +--------------------+----------+-----------------+--------+  AVF Anastomosis        708                               +--------------------+----------+-----------------+--------+     +------------+----------+-------------+----------+----------------+  OUTFLOW VEINPSV (cm/s)Diameter (cm)Depth (cm)    Describe      +------------+----------+-------------+----------+----------------+  Prox UA        201        0.62        0.63   competing branch  +------------+----------+-------------+----------+----------------+  Mid UA         225        0.72        0.27   competing branch  +------------+----------+-------------+----------+----------------+  Dist UA        217         0.73        0.32                     +------------+----------+-------------+----------+----------------+  AC Fossa       420        0.41        0.49                     +------------+----------+-------------+----------+----------------+    Assessment/Plan:  This is a 82 y.o. male who is s/p: Left BC AVF 11/11/2022 by Dr. Karin Lieuobins  -the pt does have evidence of mild steal with numbness of his left hand with motor and sensory in tact.  We discussed that if this worsens or he is not able to use his hand, we would need to ligate the fistula.  -pt's fistula has matured nicely and this access can be used 02/09/2023. -discussed with pt that access does not last forever and will need intervention or even new access at some point.  -the pt will follow up as needed.    Doreatha MassedSamantha Raidyn Breiner, [facility] [phone]  Clinic MD:  Karin Lieuobins on call MD

## 2022-12-26 ENCOUNTER — Ambulatory Visit (INDEPENDENT_AMBULATORY_CARE_PROVIDER_SITE_OTHER): Payer: Medicare Other | Admitting: Physician Assistant

## 2022-12-26 ENCOUNTER — Ambulatory Visit (HOSPITAL_COMMUNITY)
Admission: RE | Admit: 2022-12-26 | Discharge: 2022-12-26 | Disposition: A | Discharge status: Home / Self Care | Payer: Medicare Other | Source: Ambulatory Visit | Attending: Vascular Surgery | Admitting: Vascular Surgery

## 2022-12-26 ENCOUNTER — Encounter: Payer: Self-pay | Admitting: Physician Assistant

## 2022-12-26 DIAGNOSIS — N185 Chronic kidney disease, stage 5: Secondary | ICD-10-CM | POA: Diagnosis not present

## 2022-12-29 DIAGNOSIS — D631 Anemia in chronic kidney disease: Secondary | ICD-10-CM | POA: Diagnosis not present

## 2022-12-29 DIAGNOSIS — N183 Chronic kidney disease, stage 3 unspecified: Secondary | ICD-10-CM | POA: Diagnosis not present

## 2023-01-05 DIAGNOSIS — N185 Chronic kidney disease, stage 5: Secondary | ICD-10-CM | POA: Diagnosis not present

## 2023-01-05 DIAGNOSIS — D631 Anemia in chronic kidney disease: Secondary | ICD-10-CM | POA: Diagnosis not present

## 2023-01-12 DIAGNOSIS — D631 Anemia in chronic kidney disease: Secondary | ICD-10-CM | POA: Diagnosis not present

## 2023-01-12 DIAGNOSIS — N185 Chronic kidney disease, stage 5: Secondary | ICD-10-CM | POA: Diagnosis not present

## 2023-01-15 DIAGNOSIS — N185 Chronic kidney disease, stage 5: Secondary | ICD-10-CM | POA: Diagnosis not present

## 2023-01-19 DIAGNOSIS — D631 Anemia in chronic kidney disease: Secondary | ICD-10-CM | POA: Diagnosis not present

## 2023-01-19 DIAGNOSIS — N185 Chronic kidney disease, stage 5: Secondary | ICD-10-CM | POA: Diagnosis not present

## 2023-01-20 DIAGNOSIS — I120 Hypertensive chronic kidney disease with stage 5 chronic kidney disease or end stage renal disease: Secondary | ICD-10-CM | POA: Diagnosis not present

## 2023-01-20 DIAGNOSIS — N185 Chronic kidney disease, stage 5: Secondary | ICD-10-CM | POA: Diagnosis not present

## 2023-01-20 DIAGNOSIS — D631 Anemia in chronic kidney disease: Secondary | ICD-10-CM | POA: Diagnosis not present

## 2023-01-20 DIAGNOSIS — N2581 Secondary hyperparathyroidism of renal origin: Secondary | ICD-10-CM | POA: Diagnosis not present

## 2023-01-20 DIAGNOSIS — E872 Acidosis, unspecified: Secondary | ICD-10-CM | POA: Diagnosis not present

## 2023-01-20 DIAGNOSIS — E1122 Type 2 diabetes mellitus with diabetic chronic kidney disease: Secondary | ICD-10-CM | POA: Diagnosis not present

## 2023-01-21 ENCOUNTER — Other Ambulatory Visit: Payer: Self-pay | Admitting: Cardiology

## 2023-01-26 DIAGNOSIS — D631 Anemia in chronic kidney disease: Secondary | ICD-10-CM | POA: Diagnosis not present

## 2023-01-26 DIAGNOSIS — N183 Chronic kidney disease, stage 3 unspecified: Secondary | ICD-10-CM | POA: Diagnosis not present

## 2023-01-28 DIAGNOSIS — N189 Chronic kidney disease, unspecified: Secondary | ICD-10-CM | POA: Diagnosis not present

## 2023-01-28 DIAGNOSIS — D631 Anemia in chronic kidney disease: Secondary | ICD-10-CM | POA: Diagnosis not present

## 2023-02-02 DIAGNOSIS — D631 Anemia in chronic kidney disease: Secondary | ICD-10-CM | POA: Diagnosis not present

## 2023-02-02 DIAGNOSIS — N183 Chronic kidney disease, stage 3 unspecified: Secondary | ICD-10-CM | POA: Diagnosis not present

## 2023-02-04 DIAGNOSIS — D631 Anemia in chronic kidney disease: Secondary | ICD-10-CM | POA: Diagnosis not present

## 2023-02-04 DIAGNOSIS — N189 Chronic kidney disease, unspecified: Secondary | ICD-10-CM | POA: Diagnosis not present

## 2023-02-10 DIAGNOSIS — N185 Chronic kidney disease, stage 5: Secondary | ICD-10-CM | POA: Diagnosis not present

## 2023-02-10 DIAGNOSIS — D631 Anemia in chronic kidney disease: Secondary | ICD-10-CM | POA: Diagnosis not present

## 2023-02-12 DIAGNOSIS — N185 Chronic kidney disease, stage 5: Secondary | ICD-10-CM | POA: Diagnosis not present

## 2023-02-12 DIAGNOSIS — E119 Type 2 diabetes mellitus without complications: Secondary | ICD-10-CM | POA: Diagnosis not present

## 2023-02-12 DIAGNOSIS — J329 Chronic sinusitis, unspecified: Secondary | ICD-10-CM | POA: Diagnosis not present

## 2023-02-12 DIAGNOSIS — Z79899 Other long term (current) drug therapy: Secondary | ICD-10-CM | POA: Diagnosis not present

## 2023-02-12 DIAGNOSIS — Z0000 Encounter for general adult medical examination without abnormal findings: Secondary | ICD-10-CM | POA: Diagnosis not present

## 2023-02-12 DIAGNOSIS — I110 Hypertensive heart disease with heart failure: Secondary | ICD-10-CM | POA: Diagnosis not present

## 2023-02-16 DIAGNOSIS — Z043 Encounter for examination and observation following other accident: Secondary | ICD-10-CM | POA: Diagnosis not present

## 2023-02-16 DIAGNOSIS — S8002XA Contusion of left knee, initial encounter: Secondary | ICD-10-CM | POA: Diagnosis not present

## 2023-02-16 DIAGNOSIS — M25562 Pain in left knee: Secondary | ICD-10-CM | POA: Diagnosis not present

## 2023-02-16 DIAGNOSIS — W19XXXA Unspecified fall, initial encounter: Secondary | ICD-10-CM | POA: Diagnosis not present

## 2023-02-16 DIAGNOSIS — N185 Chronic kidney disease, stage 5: Secondary | ICD-10-CM | POA: Diagnosis not present

## 2023-02-16 DIAGNOSIS — S0081XA Abrasion of other part of head, initial encounter: Secondary | ICD-10-CM | POA: Diagnosis not present

## 2023-02-16 DIAGNOSIS — S0083XA Contusion of other part of head, initial encounter: Secondary | ICD-10-CM | POA: Diagnosis not present

## 2023-02-16 DIAGNOSIS — D631 Anemia in chronic kidney disease: Secondary | ICD-10-CM | POA: Diagnosis not present

## 2023-02-23 DIAGNOSIS — E872 Acidosis, unspecified: Secondary | ICD-10-CM | POA: Diagnosis not present

## 2023-02-23 DIAGNOSIS — I120 Hypertensive chronic kidney disease with stage 5 chronic kidney disease or end stage renal disease: Secondary | ICD-10-CM | POA: Diagnosis not present

## 2023-02-23 DIAGNOSIS — E1122 Type 2 diabetes mellitus with diabetic chronic kidney disease: Secondary | ICD-10-CM | POA: Diagnosis not present

## 2023-02-23 DIAGNOSIS — D631 Anemia in chronic kidney disease: Secondary | ICD-10-CM | POA: Diagnosis not present

## 2023-02-23 DIAGNOSIS — N2581 Secondary hyperparathyroidism of renal origin: Secondary | ICD-10-CM | POA: Diagnosis not present

## 2023-02-23 DIAGNOSIS — N185 Chronic kidney disease, stage 5: Secondary | ICD-10-CM | POA: Diagnosis not present

## 2023-02-24 DIAGNOSIS — N185 Chronic kidney disease, stage 5: Secondary | ICD-10-CM | POA: Diagnosis not present

## 2023-02-24 DIAGNOSIS — D631 Anemia in chronic kidney disease: Secondary | ICD-10-CM | POA: Diagnosis not present

## 2023-03-09 DIAGNOSIS — N185 Chronic kidney disease, stage 5: Secondary | ICD-10-CM | POA: Diagnosis not present

## 2023-03-09 DIAGNOSIS — D631 Anemia in chronic kidney disease: Secondary | ICD-10-CM | POA: Diagnosis not present

## 2023-03-16 DIAGNOSIS — N185 Chronic kidney disease, stage 5: Secondary | ICD-10-CM | POA: Diagnosis not present

## 2023-03-16 DIAGNOSIS — D631 Anemia in chronic kidney disease: Secondary | ICD-10-CM | POA: Diagnosis not present

## 2023-03-23 DIAGNOSIS — D631 Anemia in chronic kidney disease: Secondary | ICD-10-CM | POA: Diagnosis not present

## 2023-03-30 DIAGNOSIS — I120 Hypertensive chronic kidney disease with stage 5 chronic kidney disease or end stage renal disease: Secondary | ICD-10-CM | POA: Diagnosis not present

## 2023-03-30 DIAGNOSIS — N2581 Secondary hyperparathyroidism of renal origin: Secondary | ICD-10-CM | POA: Diagnosis not present

## 2023-03-30 DIAGNOSIS — E872 Acidosis, unspecified: Secondary | ICD-10-CM | POA: Diagnosis not present

## 2023-03-30 DIAGNOSIS — D631 Anemia in chronic kidney disease: Secondary | ICD-10-CM | POA: Diagnosis not present

## 2023-03-30 DIAGNOSIS — E1122 Type 2 diabetes mellitus with diabetic chronic kidney disease: Secondary | ICD-10-CM | POA: Diagnosis not present

## 2023-03-30 DIAGNOSIS — N185 Chronic kidney disease, stage 5: Secondary | ICD-10-CM | POA: Diagnosis not present

## 2023-04-13 DIAGNOSIS — D631 Anemia in chronic kidney disease: Secondary | ICD-10-CM | POA: Diagnosis not present

## 2023-04-13 DIAGNOSIS — N185 Chronic kidney disease, stage 5: Secondary | ICD-10-CM | POA: Diagnosis not present

## 2023-04-27 DIAGNOSIS — N185 Chronic kidney disease, stage 5: Secondary | ICD-10-CM | POA: Diagnosis not present

## 2023-04-27 DIAGNOSIS — D631 Anemia in chronic kidney disease: Secondary | ICD-10-CM | POA: Diagnosis not present

## 2023-05-11 DIAGNOSIS — N185 Chronic kidney disease, stage 5: Secondary | ICD-10-CM | POA: Diagnosis not present

## 2023-05-11 DIAGNOSIS — D631 Anemia in chronic kidney disease: Secondary | ICD-10-CM | POA: Diagnosis not present

## 2023-05-19 DIAGNOSIS — N2581 Secondary hyperparathyroidism of renal origin: Secondary | ICD-10-CM | POA: Diagnosis not present

## 2023-05-19 DIAGNOSIS — N185 Chronic kidney disease, stage 5: Secondary | ICD-10-CM | POA: Diagnosis not present

## 2023-05-19 DIAGNOSIS — E872 Acidosis, unspecified: Secondary | ICD-10-CM | POA: Diagnosis not present

## 2023-05-19 DIAGNOSIS — E1122 Type 2 diabetes mellitus with diabetic chronic kidney disease: Secondary | ICD-10-CM | POA: Diagnosis not present

## 2023-05-19 DIAGNOSIS — I120 Hypertensive chronic kidney disease with stage 5 chronic kidney disease or end stage renal disease: Secondary | ICD-10-CM | POA: Diagnosis not present

## 2023-05-19 DIAGNOSIS — D631 Anemia in chronic kidney disease: Secondary | ICD-10-CM | POA: Diagnosis not present

## 2023-05-21 DIAGNOSIS — Z23 Encounter for immunization: Secondary | ICD-10-CM | POA: Diagnosis not present

## 2023-05-25 DIAGNOSIS — D631 Anemia in chronic kidney disease: Secondary | ICD-10-CM | POA: Diagnosis not present

## 2023-05-25 DIAGNOSIS — N185 Chronic kidney disease, stage 5: Secondary | ICD-10-CM | POA: Diagnosis not present

## 2023-05-27 DIAGNOSIS — E119 Type 2 diabetes mellitus without complications: Secondary | ICD-10-CM | POA: Diagnosis not present

## 2023-06-01 DIAGNOSIS — Z23 Encounter for immunization: Secondary | ICD-10-CM | POA: Diagnosis not present

## 2023-06-05 ENCOUNTER — Other Ambulatory Visit (HOSPITAL_COMMUNITY): Payer: Self-pay | Admitting: *Deleted

## 2023-06-08 ENCOUNTER — Inpatient Hospital Stay (HOSPITAL_COMMUNITY): Admission: RE | Admit: 2023-06-08 | Payer: Medicare Other | Source: Ambulatory Visit

## 2023-06-08 DIAGNOSIS — D2271 Melanocytic nevi of right lower limb, including hip: Secondary | ICD-10-CM | POA: Diagnosis not present

## 2023-06-08 DIAGNOSIS — L812 Freckles: Secondary | ICD-10-CM | POA: Diagnosis not present

## 2023-06-08 DIAGNOSIS — L570 Actinic keratosis: Secondary | ICD-10-CM | POA: Diagnosis not present

## 2023-06-08 DIAGNOSIS — Z85828 Personal history of other malignant neoplasm of skin: Secondary | ICD-10-CM | POA: Diagnosis not present

## 2023-06-08 DIAGNOSIS — D692 Other nonthrombocytopenic purpura: Secondary | ICD-10-CM | POA: Diagnosis not present

## 2023-06-08 DIAGNOSIS — L565 Disseminated superficial actinic porokeratosis (DSAP): Secondary | ICD-10-CM | POA: Diagnosis not present

## 2023-06-08 DIAGNOSIS — D1801 Hemangioma of skin and subcutaneous tissue: Secondary | ICD-10-CM | POA: Diagnosis not present

## 2023-06-08 DIAGNOSIS — L821 Other seborrheic keratosis: Secondary | ICD-10-CM | POA: Diagnosis not present

## 2023-06-09 DIAGNOSIS — N185 Chronic kidney disease, stage 5: Secondary | ICD-10-CM | POA: Diagnosis not present

## 2023-06-09 DIAGNOSIS — D631 Anemia in chronic kidney disease: Secondary | ICD-10-CM | POA: Diagnosis not present

## 2023-06-15 ENCOUNTER — Encounter (HOSPITAL_COMMUNITY): Payer: Medicare Other

## 2023-06-16 DIAGNOSIS — N2581 Secondary hyperparathyroidism of renal origin: Secondary | ICD-10-CM | POA: Diagnosis not present

## 2023-06-16 DIAGNOSIS — I120 Hypertensive chronic kidney disease with stage 5 chronic kidney disease or end stage renal disease: Secondary | ICD-10-CM | POA: Diagnosis not present

## 2023-06-16 DIAGNOSIS — N185 Chronic kidney disease, stage 5: Secondary | ICD-10-CM | POA: Diagnosis not present

## 2023-06-16 DIAGNOSIS — E872 Acidosis, unspecified: Secondary | ICD-10-CM | POA: Diagnosis not present

## 2023-06-16 DIAGNOSIS — D631 Anemia in chronic kidney disease: Secondary | ICD-10-CM | POA: Diagnosis not present

## 2023-06-16 DIAGNOSIS — E1122 Type 2 diabetes mellitus with diabetic chronic kidney disease: Secondary | ICD-10-CM | POA: Diagnosis not present

## 2023-06-17 NOTE — Progress Notes (Unsigned)
at bedtime.   ipratropium (ATROVENT) 0.06 % nasal spray Place 2 sprays into both nostrils daily as needed for rhinitis.   Multiple Vitamin (MULTIVITAMIN) tablet Take 1 tablet by mouth daily.   Resveratrol-Quercetin (RESVERATROL PLUS) 100-100 MG TABS Take 1 capsule by mouth daily.   Saw Palmetto 450 MG CAPS Take 1,350 mg by mouth daily.   simvastatin (ZOCOR) 20 MG tablet Take 20 mg by mouth every evening.   torsemide (DEMADEX) 20 MG tablet Take 1 tablet (20 mg total) by mouth 2 (two) times daily.   Turmeric (QC TUMERIC COMPLEX PO) Take 1 tablet by mouth in the morning and at bedtime.      EKGs/Labs/Other Studies Reviewed:    The following studies were  reviewed today:  Cardiac Studies & Procedures       ECHOCARDIOGRAM  ECHOCARDIOGRAM COMPLETE 01/08/2022  Narrative ECHOCARDIOGRAM REPORT    Patient Name:   Jason Beltran Date of Exam: 01/08/2022 Medical Rec #:  [id number]    Height:       68.0 in Accession #:    [id number]   Weight:       184.0 lb Date of Birth:  08-30-1941    BSA:          1.973 m Patient Age:    82 years     BP:           119/65 mmHg Patient Gender: M            HR:           72 bpm. Exam Location:  Inpatient  Procedure: 2D Echo, Cardiac Doppler and Color Doppler  Indications:    CHF  History:        Patient has prior history of Echocardiogram examinations, most recent 07/24/2021. CHF; Risk Factors:Hypertension and Diabetes.  Sonographer:    Eduard Roux Referring Phys: [id number] KUBER GHIMIRE  IMPRESSIONS   1. Mild anterolateral and posterolateral hypokinesis. Left ventricular ejection fraction, by estimation, is 40 to 45%. The left ventricle has mildly decreased function. The left ventricle demonstrates regional wall motion abnormalities (see scoring diagram/findings for description). Left ventricular diastolic parameters are consistent with Grade I diastolic dysfunction (impaired relaxation). 2. Right ventricular systolic function is normal. The right ventricular size is normal. There is normal pulmonary artery systolic pressure. 3. Left atrial size was severely dilated. 4. Right atrial size was mildly dilated. 5. The mitral valve is normal in structure. Trivial mitral valve regurgitation. No evidence of mitral stenosis. 6. The aortic valve is tricuspid. There is mild calcification of the aortic valve. Aortic valve regurgitation is not visualized. No aortic stenosis is present. 7. The inferior vena cava is normal in size with greater than 50% respiratory variability, suggesting right atrial pressure of 3 mmHg.  FINDINGS Left Ventricle: Mild anterolateral and posterolateral hypokinesis. Left ventricular  ejection fraction, by estimation, is 40 to 45%. The left ventricle has mildly decreased function. The left ventricle demonstrates regional wall motion abnormalities. The left ventricular internal cavity size was normal in size. There is no left ventricular hypertrophy. Left ventricular diastolic parameters are consistent with Grade I diastolic dysfunction (impaired relaxation). Indeterminate filling pressures.  Right Ventricle: The right ventricular size is normal. No increase in right ventricular wall thickness. Right ventricular systolic function is normal. There is normal pulmonary artery systolic pressure. The tricuspid regurgitant velocity is 2.00 m/s, and with an assumed right atrial pressure of 3 mmHg, the estimated right ventricular systolic pressure is 19.0 mmHg.  Left Atrium: Left atrial  Cardiology Office Note:    Date:  06/18/2023   ID:  Jason Beltran, DOB 1941-01-01, MRN [id number]  PCP:  Julianne Handler, NP  Cardiologist:  Norman Herrlich, MD    Referring MD: Noni Saupe, MD    ASSESSMENT:    1. Hypertensive heart disease with chronic combined systolic and diastolic congestive heart failure (HCC)   2. CKD (chronic kidney disease) stage 5, GFR less than 15 ml/min (HCC)   3. Anemia due to stage 4 chronic kidney disease (HCC)   4. Nonrheumatic aortic valve stenosis    PLAN:    In order of problems listed above:  He continues to do well stable and so far has avoided the initiation of heme dialysis.  He has no volume overload and follows closely with the nephrology group continue his current loop diuretic and antihypertensives carvedilol amlodipine and hydralazine. Stable anemia  Plan to recheck echocardiogram after his next visit   Next appointment: 6 months   Medication Adjustments/Labs and Tests Ordered: Current medicines are reviewed at length with the patient today.  Concerns regarding medicines are outlined above.  No orders of the defined types were placed in this encounter.  No orders of the defined types were placed in this encounter.    History of Present Illness:    Jason Beltran is a 82 y.o. male with a hx of hypertensive heart disease with chronic combined systolic and diastolic heart failure 40 to 45% stage V CKD and anemia related to CKD last seen with 12/10/2022.  Compliance with diet, lifestyle and medications: Yes  He continues to be stable and has avoided the initiation of hemodialysis.  He is followed closely by the nephrology service.  He is not having edema shortness of breath chest pain palpitation or syncope  Most recent labs 01/15/2023 hemoglobin 10.4 creatinine 5.26 potassium 4.1 Past Medical History:  Diagnosis Date   Anemia of chronic disease    Aortic stenosis 05/08/2018   Very mild mean 9 mm Hg   Chronic sinusitis     CKD (chronic kidney disease)    Diabetes mellitus type 2, uncontrolled    Diastolic CHF (HCC)    Diastolic dysfunction 05/08/2018   Essential hypertension 05/06/2018   Heart murmur    Hyperparathyroidism (HCC)    Hypertension    Neuropathy, peripheral    Obesity    Type 2 diabetes mellitus (HCC) 05/06/2018    Current Medications: Current Meds  Medication Sig   ALOE VERA PO Take 200 mg by mouth daily.   amLODipine (NORVASC) 5 MG tablet Take 5 mg by mouth daily.   aspirin 81 MG EC tablet Take 81 mg by mouth every evening.   Biotin 5000 MCG TABS Take 5,000 mcg by mouth at bedtime.   carvedilol (COREG) 6.25 MG tablet Take 6.25 mg by mouth 2 (two) times daily with a meal.   cetirizine (ZYRTEC) 10 MG tablet Take 10 mg by mouth daily as needed for allergies.   Cholecalciferol (VITAMIN D3) 50 MCG (2000 UT) TABS Take 2,000 Units by mouth daily.   Coenzyme Q10 (COQ10) 200 MG CAPS Take 200 mg by mouth every evening.   febuxostat (ULORIC) 40 MG tablet Take 20 mg by mouth daily.   gabapentin (NEURONTIN) 400 MG capsule Take 1 capsule (400 mg total) by mouth at bedtime.   Glucosamine-Chondroit-Vit C-Mn (GLUCOSAMINE 1500 COMPLEX) CAPS Take 1 capsule by mouth daily.   hydrALAZINE (APRESOLINE) 50 MG tablet Take 50 mg by mouth in the morning and  at bedtime.   ipratropium (ATROVENT) 0.06 % nasal spray Place 2 sprays into both nostrils daily as needed for rhinitis.   Multiple Vitamin (MULTIVITAMIN) tablet Take 1 tablet by mouth daily.   Resveratrol-Quercetin (RESVERATROL PLUS) 100-100 MG TABS Take 1 capsule by mouth daily.   Saw Palmetto 450 MG CAPS Take 1,350 mg by mouth daily.   simvastatin (ZOCOR) 20 MG tablet Take 20 mg by mouth every evening.   torsemide (DEMADEX) 20 MG tablet Take 1 tablet (20 mg total) by mouth 2 (two) times daily.   Turmeric (QC TUMERIC COMPLEX PO) Take 1 tablet by mouth in the morning and at bedtime.      EKGs/Labs/Other Studies Reviewed:    The following studies were  reviewed today:  Cardiac Studies & Procedures       ECHOCARDIOGRAM  ECHOCARDIOGRAM COMPLETE 01/08/2022  Narrative ECHOCARDIOGRAM REPORT    Patient Name:   Jason Beltran Date of Exam: 01/08/2022 Medical Rec #:  [id number]    Height:       68.0 in Accession #:    [id number]   Weight:       184.0 lb Date of Birth:  08-30-1941    BSA:          1.973 m Patient Age:    82 years     BP:           119/65 mmHg Patient Gender: M            HR:           72 bpm. Exam Location:  Inpatient  Procedure: 2D Echo, Cardiac Doppler and Color Doppler  Indications:    CHF  History:        Patient has prior history of Echocardiogram examinations, most recent 07/24/2021. CHF; Risk Factors:Hypertension and Diabetes.  Sonographer:    Eduard Roux Referring Phys: [id number] KUBER GHIMIRE  IMPRESSIONS   1. Mild anterolateral and posterolateral hypokinesis. Left ventricular ejection fraction, by estimation, is 40 to 45%. The left ventricle has mildly decreased function. The left ventricle demonstrates regional wall motion abnormalities (see scoring diagram/findings for description). Left ventricular diastolic parameters are consistent with Grade I diastolic dysfunction (impaired relaxation). 2. Right ventricular systolic function is normal. The right ventricular size is normal. There is normal pulmonary artery systolic pressure. 3. Left atrial size was severely dilated. 4. Right atrial size was mildly dilated. 5. The mitral valve is normal in structure. Trivial mitral valve regurgitation. No evidence of mitral stenosis. 6. The aortic valve is tricuspid. There is mild calcification of the aortic valve. Aortic valve regurgitation is not visualized. No aortic stenosis is present. 7. The inferior vena cava is normal in size with greater than 50% respiratory variability, suggesting right atrial pressure of 3 mmHg.  FINDINGS Left Ventricle: Mild anterolateral and posterolateral hypokinesis. Left ventricular  ejection fraction, by estimation, is 40 to 45%. The left ventricle has mildly decreased function. The left ventricle demonstrates regional wall motion abnormalities. The left ventricular internal cavity size was normal in size. There is no left ventricular hypertrophy. Left ventricular diastolic parameters are consistent with Grade I diastolic dysfunction (impaired relaxation). Indeterminate filling pressures.  Right Ventricle: The right ventricular size is normal. No increase in right ventricular wall thickness. Right ventricular systolic function is normal. There is normal pulmonary artery systolic pressure. The tricuspid regurgitant velocity is 2.00 m/s, and with an assumed right atrial pressure of 3 mmHg, the estimated right ventricular systolic pressure is 19.0 mmHg.  Left Atrium: Left atrial  at bedtime.   ipratropium (ATROVENT) 0.06 % nasal spray Place 2 sprays into both nostrils daily as needed for rhinitis.   Multiple Vitamin (MULTIVITAMIN) tablet Take 1 tablet by mouth daily.   Resveratrol-Quercetin (RESVERATROL PLUS) 100-100 MG TABS Take 1 capsule by mouth daily.   Saw Palmetto 450 MG CAPS Take 1,350 mg by mouth daily.   simvastatin (ZOCOR) 20 MG tablet Take 20 mg by mouth every evening.   torsemide (DEMADEX) 20 MG tablet Take 1 tablet (20 mg total) by mouth 2 (two) times daily.   Turmeric (QC TUMERIC COMPLEX PO) Take 1 tablet by mouth in the morning and at bedtime.      EKGs/Labs/Other Studies Reviewed:    The following studies were  reviewed today:  Cardiac Studies & Procedures       ECHOCARDIOGRAM  ECHOCARDIOGRAM COMPLETE 01/08/2022  Narrative ECHOCARDIOGRAM REPORT    Patient Name:   Jason Beltran Date of Exam: 01/08/2022 Medical Rec #:  [id number]    Height:       68.0 in Accession #:    [id number]   Weight:       184.0 lb Date of Birth:  08-30-1941    BSA:          1.973 m Patient Age:    82 years     BP:           119/65 mmHg Patient Gender: M            HR:           72 bpm. Exam Location:  Inpatient  Procedure: 2D Echo, Cardiac Doppler and Color Doppler  Indications:    CHF  History:        Patient has prior history of Echocardiogram examinations, most recent 07/24/2021. CHF; Risk Factors:Hypertension and Diabetes.  Sonographer:    Eduard Roux Referring Phys: [id number] KUBER GHIMIRE  IMPRESSIONS   1. Mild anterolateral and posterolateral hypokinesis. Left ventricular ejection fraction, by estimation, is 40 to 45%. The left ventricle has mildly decreased function. The left ventricle demonstrates regional wall motion abnormalities (see scoring diagram/findings for description). Left ventricular diastolic parameters are consistent with Grade I diastolic dysfunction (impaired relaxation). 2. Right ventricular systolic function is normal. The right ventricular size is normal. There is normal pulmonary artery systolic pressure. 3. Left atrial size was severely dilated. 4. Right atrial size was mildly dilated. 5. The mitral valve is normal in structure. Trivial mitral valve regurgitation. No evidence of mitral stenosis. 6. The aortic valve is tricuspid. There is mild calcification of the aortic valve. Aortic valve regurgitation is not visualized. No aortic stenosis is present. 7. The inferior vena cava is normal in size with greater than 50% respiratory variability, suggesting right atrial pressure of 3 mmHg.  FINDINGS Left Ventricle: Mild anterolateral and posterolateral hypokinesis. Left ventricular  ejection fraction, by estimation, is 40 to 45%. The left ventricle has mildly decreased function. The left ventricle demonstrates regional wall motion abnormalities. The left ventricular internal cavity size was normal in size. There is no left ventricular hypertrophy. Left ventricular diastolic parameters are consistent with Grade I diastolic dysfunction (impaired relaxation). Indeterminate filling pressures.  Right Ventricle: The right ventricular size is normal. No increase in right ventricular wall thickness. Right ventricular systolic function is normal. There is normal pulmonary artery systolic pressure. The tricuspid regurgitant velocity is 2.00 m/s, and with an assumed right atrial pressure of 3 mmHg, the estimated right ventricular systolic pressure is 19.0 mmHg.  Left Atrium: Left atrial

## 2023-06-18 ENCOUNTER — Encounter: Payer: Self-pay | Admitting: Cardiology

## 2023-06-18 ENCOUNTER — Ambulatory Visit: Payer: Medicare Other | Attending: Cardiology | Admitting: Cardiology

## 2023-06-18 VITALS — BP 138/60 | HR 56 | Ht 68.0 in | Wt 184.4 lb

## 2023-06-18 DIAGNOSIS — D631 Anemia in chronic kidney disease: Secondary | ICD-10-CM | POA: Insufficient documentation

## 2023-06-18 DIAGNOSIS — N184 Chronic kidney disease, stage 4 (severe): Secondary | ICD-10-CM | POA: Insufficient documentation

## 2023-06-18 DIAGNOSIS — I350 Nonrheumatic aortic (valve) stenosis: Secondary | ICD-10-CM | POA: Insufficient documentation

## 2023-06-18 DIAGNOSIS — I110 Hypertensive heart disease with heart failure: Secondary | ICD-10-CM | POA: Insufficient documentation

## 2023-06-18 DIAGNOSIS — N185 Chronic kidney disease, stage 5: Secondary | ICD-10-CM | POA: Insufficient documentation

## 2023-06-18 DIAGNOSIS — I5042 Chronic combined systolic (congestive) and diastolic (congestive) heart failure: Secondary | ICD-10-CM | POA: Diagnosis not present

## 2023-06-18 NOTE — Patient Instructions (Signed)
Medication Instructions:  Your physician recommends that you continue on your current medications as directed. Please refer to the Current Medication list given to you today.  *If you need a refill on your cardiac medications before your next appointment, please call your pharmacy*   Lab Work: None If you have labs (blood work) drawn today and your tests are completely normal, you will receive your results only by: MyChart Message (if you have MyChart) OR A paper copy in the mail If you have any lab test that is abnormal or we need to change your treatment, we will call you to review the results.   Testing/Procedures: None   Follow-Up: At [facility], you and your health needs are our priority.  As part of our continuing mission to provide you with exceptional heart care, we have created designated Provider Care Teams.  These Care Teams include your primary Cardiologist (physician) and Advanced Practice Providers (APPs -  Physician Assistants and Nurse Practitioners) who all work together to provide you with the care you need, when you need it.  We recommend signing up for the patient portal called "MyChart".  Sign up information is provided on this After Visit Summary.  MyChart is used to connect with patients for Virtual Visits (Telemedicine).  Patients are able to view lab/test results, encounter notes, upcoming appointments, etc.  Non-urgent messages can be sent to your provider as well.   To learn more about what you can do with MyChart, go to [email or web address].    Your next appointment:   6 month(s)  Provider:   Brian Munley, MD    Other Instructions None  

## 2023-06-22 DIAGNOSIS — D631 Anemia in chronic kidney disease: Secondary | ICD-10-CM | POA: Diagnosis not present

## 2023-06-22 DIAGNOSIS — N185 Chronic kidney disease, stage 5: Secondary | ICD-10-CM | POA: Diagnosis not present

## 2023-07-06 DIAGNOSIS — N185 Chronic kidney disease, stage 5: Secondary | ICD-10-CM | POA: Diagnosis not present

## 2023-07-06 DIAGNOSIS — D631 Anemia in chronic kidney disease: Secondary | ICD-10-CM | POA: Diagnosis not present

## 2023-07-20 DIAGNOSIS — N185 Chronic kidney disease, stage 5: Secondary | ICD-10-CM | POA: Diagnosis not present

## 2023-07-20 DIAGNOSIS — D631 Anemia in chronic kidney disease: Secondary | ICD-10-CM | POA: Diagnosis not present

## 2023-07-27 DIAGNOSIS — N185 Chronic kidney disease, stage 5: Secondary | ICD-10-CM | POA: Diagnosis not present

## 2023-07-27 DIAGNOSIS — I120 Hypertensive chronic kidney disease with stage 5 chronic kidney disease or end stage renal disease: Secondary | ICD-10-CM | POA: Diagnosis not present

## 2023-07-27 DIAGNOSIS — E1122 Type 2 diabetes mellitus with diabetic chronic kidney disease: Secondary | ICD-10-CM | POA: Diagnosis not present

## 2023-07-27 DIAGNOSIS — E872 Acidosis, unspecified: Secondary | ICD-10-CM | POA: Diagnosis not present

## 2023-07-27 DIAGNOSIS — D631 Anemia in chronic kidney disease: Secondary | ICD-10-CM | POA: Diagnosis not present

## 2023-07-27 DIAGNOSIS — N2581 Secondary hyperparathyroidism of renal origin: Secondary | ICD-10-CM | POA: Diagnosis not present

## 2023-08-03 DIAGNOSIS — N185 Chronic kidney disease, stage 5: Secondary | ICD-10-CM | POA: Diagnosis not present

## 2023-08-03 DIAGNOSIS — D631 Anemia in chronic kidney disease: Secondary | ICD-10-CM | POA: Diagnosis not present

## 2023-08-17 DIAGNOSIS — N185 Chronic kidney disease, stage 5: Secondary | ICD-10-CM | POA: Diagnosis not present

## 2023-08-17 DIAGNOSIS — D631 Anemia in chronic kidney disease: Secondary | ICD-10-CM | POA: Diagnosis not present

## 2023-08-26 DIAGNOSIS — I120 Hypertensive chronic kidney disease with stage 5 chronic kidney disease or end stage renal disease: Secondary | ICD-10-CM | POA: Diagnosis not present

## 2023-08-26 DIAGNOSIS — E1122 Type 2 diabetes mellitus with diabetic chronic kidney disease: Secondary | ICD-10-CM | POA: Diagnosis not present

## 2023-08-26 DIAGNOSIS — N2581 Secondary hyperparathyroidism of renal origin: Secondary | ICD-10-CM | POA: Diagnosis not present

## 2023-08-26 DIAGNOSIS — E872 Acidosis, unspecified: Secondary | ICD-10-CM | POA: Diagnosis not present

## 2023-08-26 DIAGNOSIS — D631 Anemia in chronic kidney disease: Secondary | ICD-10-CM | POA: Diagnosis not present

## 2023-08-26 DIAGNOSIS — N185 Chronic kidney disease, stage 5: Secondary | ICD-10-CM | POA: Diagnosis not present

## 2023-08-31 DIAGNOSIS — N185 Chronic kidney disease, stage 5: Secondary | ICD-10-CM | POA: Diagnosis not present

## 2023-08-31 DIAGNOSIS — D631 Anemia in chronic kidney disease: Secondary | ICD-10-CM | POA: Diagnosis not present

## 2023-09-14 DIAGNOSIS — N185 Chronic kidney disease, stage 5: Secondary | ICD-10-CM | POA: Diagnosis not present

## 2023-09-14 DIAGNOSIS — D631 Anemia in chronic kidney disease: Secondary | ICD-10-CM | POA: Diagnosis not present

## 2023-10-01 LAB — LAB REPORT - SCANNED: EGFR: 12

## 2023-11-17 LAB — RENAL FUNCTION PANEL: EGFR: 13

## 2024-01-27 DIAGNOSIS — R011 Cardiac murmur, unspecified: Secondary | ICD-10-CM | POA: Insufficient documentation

## 2024-01-29 ENCOUNTER — Ambulatory Visit: Admitting: Cardiology

## 2024-01-29 ENCOUNTER — Ambulatory Visit

## 2024-01-29 VITALS — BP 137/67 | HR 56 | Ht 68.0 in | Wt 172.6 lb

## 2024-01-29 DIAGNOSIS — N189 Chronic kidney disease, unspecified: Secondary | ICD-10-CM | POA: Insufficient documentation

## 2024-01-29 DIAGNOSIS — I10 Essential (primary) hypertension: Secondary | ICD-10-CM | POA: Insufficient documentation

## 2024-01-29 DIAGNOSIS — R011 Cardiac murmur, unspecified: Secondary | ICD-10-CM

## 2024-01-29 DIAGNOSIS — I350 Nonrheumatic aortic (valve) stenosis: Secondary | ICD-10-CM | POA: Diagnosis present

## 2024-01-29 DIAGNOSIS — I5042 Chronic combined systolic (congestive) and diastolic (congestive) heart failure: Secondary | ICD-10-CM | POA: Diagnosis present

## 2024-01-29 NOTE — Assessment & Plan Note (Signed)
 Mildly reduced LVEF 40 to 45% on echocardiogram April 2023. No further follow-up echocardiogram images available. Today appears compensated, euvolemic. NYHA class II appropriate to his age.  Fluid status appears to be well-maintained on current dose of torsemide  40 mg once daily. Follows up closely with nephrologist. With CKD stage V near to being on renal replacement therapy but holding steady for the last couple years.  Guideline directed medical therapy for cardiomyopathy limited due to renal function. Continue with carvedilol  6.25 mg twice daily Continue with hydralazine  50 mg twice daily.  No prior ischemic workup available. Ischemic workup limited due to renal function.  Will obtain repeat transthoracic echocardiogram to assess any significant interval change in cardiac function.

## 2024-01-29 NOTE — Assessment & Plan Note (Signed)
 Mild aortic stenosis. Last echocardiogram April 2020 3 aortic stenosis was not reported but on review of images V-max appears to be about 2.1 m/s. Valve anatomy poorly visualized but appears to be associated with significant calcification.

## 2024-01-29 NOTE — Progress Notes (Signed)
 Cardiology Consultation:    Date:  01/29/2024   ID:  Jason Beltran, DOB February 18, 1941, MRN [id number]  PCP:  Trudi Fus, NP  Cardiologist:  Daymon Evans Emma Birchler, MD   Referring MD: Trudi Fus, NP   No chief complaint on file.    ASSESSMENT AND PLAN:   Mr. Jason Beltran 83 year old male history of CHF with reduced LVEF 40 to 45%, mild aortic stenosis [V-max 2.1 m/s echocardiogram April 2023], mild mitral regurgitation, hypertension, CKD stage V currently not on hemodialysis (has left arm AV fistula in place), anemia, last echocardiogram from April 2023 noted LVEF 40 to 45%, grade 1 diastolic dysfunction, normal RV size and function, severely dilated left atrium, diabetes mellitus.   Problem List Items Addressed This Visit     Essential hypertension   Well-controlled. Continue carvedilol  and hydralazine  as above discussed under CHF.       Aortic stenosis   Mild aortic stenosis. Last echocardiogram April 2020 3 aortic stenosis was not reported but on review of images V-max appears to be about 2.1 m/s. Valve anatomy poorly visualized but appears to be associated with significant calcification.      Relevant Orders   ECHOCARDIOGRAM COMPLETE   CHF (congestive heart failure) (HCC) - Primary   Mildly reduced LVEF 40 to 45% on echocardiogram April 2023. No further follow-up echocardiogram images available. Today appears compensated, euvolemic. NYHA class II appropriate to his age.  Fluid status appears to be well-maintained on current dose of torsemide  40 mg once daily. Follows up closely with nephrologist. With CKD stage V near to being on renal replacement therapy but holding steady for the last couple years.  Guideline directed medical therapy for cardiomyopathy limited due to renal function. Continue with carvedilol  6.25 mg twice daily Continue with hydralazine  50 mg twice daily.  No prior ischemic workup available. Ischemic workup limited due to renal function.  Will  obtain repeat transthoracic echocardiogram to assess any significant interval change in cardiac function.        Relevant Orders   EKG 12-Lead (Completed)   ECHOCARDIOGRAM COMPLETE   Return to clinic tentatively in 6 months, earlier follow-up as needed.   History of Present Illness:    Jason Beltran is a 83 y.o. male who is being seen today for t follow-up visit. PCP is Trudi Fus, NP. Last visit with us  was 06/18/2023 with Dr. Sandee Crook. Follows up closely with nephrology service. Very pleasant woman here for the visit today accompanied by his wife.  Has history of CHF with reduced LVEF 40 to 45%, mild aortic stenosis [V-max 2.1 m/s echocardiogram April 2023], mild mitral regurgitation, hypertension, CKD stage V currently not on hemodialysis (has left arm AV fistula in place), anemia, last echocardiogram from April 2023 noted LVEF 40 to 45%, grade 1 diastolic dysfunction, normal RV size and function, severely dilated left atrium, diabetes mellitus. He denies any prior history of atrial fibrillation and I do not see any prior documentation of A-fib other than mention of A-fib in his problem list.  I will remove this from his problem list.  Pleasant gentleman overall mentions he has been doing stable.  Denies any significant symptoms of chest pain, shortness of breath, orthopnea.  No significant increase in fluid buildup.  Takes his torsemide  consistently 40 mg once a day in the morning.  Weight has been steady.  Continues to closely follow-up with his nephrologist for electrolyte monitoring. Has left arm fistula in place.  Denies any palpitations, lightheadedness, dizziness or syncopal  episodes.  EKG in the clinic today shows sinus rhythm heart rate 56/min, PR interval 204 ms, rightward axis, LVH morphology QRS with repolarization abnormality in inferolateral leads.  Repolarization changes more prominent in comparison to prior EKG from January 07, 2022.   Past Medical History:   Diagnosis Date   Abnormal EKG 01/03/2022   AKI (acute kidney injury) (HCC) 01/03/2022   Anemia of chronic disease    Aortic stenosis 05/08/2018   Very mild mean 9 mm Hg   CHF exacerbation (HCC) 01/07/2022   Chronic sinusitis    CKD (chronic kidney disease)    CKD (chronic kidney disease) stage 5, GFR less than 15 ml/min (HCC) 05/06/2018   COVID-19 virus infection 12/07/2020   Diabetes mellitus type 2, uncontrolled    Diastolic CHF (HCC)    Diastolic dysfunction 05/08/2018   Essential hypertension 05/06/2018   GERD (gastroesophageal reflux disease) 01/03/2022   Heart murmur    Hyperparathyroidism (HCC)    Hypertension    Hyponatremia 01/03/2022   Nausea and vomiting 01/03/2022   Neuropathy, peripheral    New onset a-fib (HCC) 01/03/2022   Obesity    Shortness of breath 01/03/2022   Type 2 diabetes mellitus (HCC) 05/06/2018    Past Surgical History:  Procedure Laterality Date   ANKLE SURGERY Right 01/20/2012   AV FISTULA PLACEMENT Left 11/11/2022   Procedure: LEFT ARM BRACHIOCEPHALIC FISTULA CREATION;  Surgeon: Kayla Part, MD;  Location: [facility] OR;  Service: Vascular;  Laterality: Left;   NASAL SINUS SURGERY     VASECTOMY      Current Medications: Current Meds  Medication Sig   aspirin  81 MG EC tablet Take 81 mg by mouth every evening.   carvedilol  (COREG ) 6.25 MG tablet Take 6.25 mg by mouth 2 (two) times daily with a meal.   Cholecalciferol (VITAMIN D3) 50 MCG (2000 UT) TABS Take 2,000 Units by mouth daily.   Coenzyme Q10 (COQ10) 200 MG CAPS Take 400 mg by mouth every evening.   febuxostat (ULORIC) 40 MG tablet Take 20 mg by mouth daily.   gabapentin  (NEURONTIN ) 100 MG capsule Take 100 mg by mouth 2 (two) times daily.   hydrALAZINE  (APRESOLINE ) 50 MG tablet Take 50 mg by mouth in the morning and at bedtime.   ipratropium (ATROVENT) 0.06 % nasal spray Place 2 sprays into both nostrils daily as needed for rhinitis.   Multiple Vitamin (MULTIVITAMIN) tablet Take 1  tablet by mouth daily.   Resveratrol-Quercetin (RESVERATROL PLUS) 100-100 MG TABS Take 1 capsule by mouth daily.   simvastatin (ZOCOR) 20 MG tablet Take 20 mg by mouth every evening.   torsemide  (DEMADEX ) 20 MG tablet Take 1 tablet (20 mg total) by mouth 2 (two) times daily.     Allergies:   Actos [pioglitazone], Lisinopril, Motrin [ibuprofen], and Trilipix [choline fenofibrate]   Social History   Socioeconomic History   Marital status: Married    Spouse name: Not on file   Number of children: Not on file   Years of education: Not on file   Highest education level: Not on file  Occupational History   Not on file  Tobacco Use   Smoking status: Never   Smokeless tobacco: Never  Vaping Use   Vaping status: Never Used  Substance and Sexual Activity   Alcohol use: Yes    Comment: occasional glass of wine   Drug use: Never   Sexual activity: Not on file  Other Topics Concern   Not on file  Social History  Narrative   Not on file   Social Drivers of Health   Financial Resource Strain: Not on file  Food Insecurity: Not on file  Transportation Needs: Not on file  Physical Activity: Not on file  Stress: Not on file  Social Connections: Not on file     Family History: The patient's family history includes Congestive Heart Failure in his mother; Heart failure in his father; Polymyalgia rheumatica in his mother; Stroke in his paternal aunt and paternal grandmother. ROS:   Please see the history of present illness.    All 14 point review of systems negative except as described per history of present illness.  EKGs/Labs/Other Studies Reviewed:    The following studies were reviewed today:   EKG:  EKG Interpretation Date/Time:  Friday Jan 29 2024 11:40:01 EDT Ventricular Rate:  56 PR Interval:  204 QRS Duration:  100 QT Interval:  474 QTC Calculation: 457 R Axis:   93  Text Interpretation: Sinus bradycardia Rightward axis Left ventricular hypertrophy with repolarization  abnormality Abnormal ECG When compared with ECG of 07-Jan-2022 13:07, Inverted T waves have replaced nonspecific T wave abnormality in Lateral leads Confirmed by Bertha Broad reddy [phone]) on 01/29/2024 12:42:54 PM    Recent Labs: No results found for requested labs within last 365 days.  Recent Lipid Panel No results found for: "CHOL", "TRIG", "HDL", "CHOLHDL", "VLDL", "LDLCALC", "LDLDIRECT"  Physical Exam:    VS:  BP 137/67   Pulse (!) 56   Ht 5\' 8"  (1.727 m)   Wt 172 lb 9.6 oz (78.3 kg)   SpO2 96%   BMI 26.24 kg/m     Wt Readings from Last 3 Encounters:  01/29/24 172 lb 9.6 oz (78.3 kg)  06/18/23 184 lb 6.4 oz (83.6 kg)  12/10/22 189 lb (85.7 kg)     GENERAL:  Well nourished, well developed in no acute distress NECK: No JVD; No carotid bruits CARDIAC: RRR, S1 and S2 present, no murmurs, no rubs, no gallops CHEST:  Clear to auscultation without rales, wheezing or rhonchi  Extremities: Trace bilateral beating ankle edema. Pulses bilaterally symmetric with radial 2+ and dorsalis pedis 2+ NEUROLOGIC:  Alert and oriented x 3  Medication Adjustments/Labs and Tests Ordered: Current medicines are reviewed at length with the patient today.  Concerns regarding medicines are outlined above.  Orders Placed This Encounter  Procedures   EKG 12-Lead   ECHOCARDIOGRAM COMPLETE   No orders of the defined types were placed in this encounter.   Signed, Justyne Roell reddy Meer Reindl, MD, MPH, [facility]. 01/29/2024 12:49 PM    [facility]

## 2024-01-29 NOTE — Assessment & Plan Note (Signed)
 Well-controlled. Continue carvedilol  and hydralazine  as above discussed under CHF.

## 2024-01-29 NOTE — Patient Instructions (Signed)
 Medication Instructions:  Your physician recommends that you continue on your current medications as directed. Please refer to the Current Medication list given to you today.  *If you need a refill on your cardiac medications before your next appointment, please call your pharmacy*  Lab Work: None If you have labs (blood work) drawn today and your tests are completely normal, you will receive your results only by: MyChart Message (if you have MyChart) OR A paper copy in the mail If you have any lab test that is abnormal or we need to change your treatment, we will call you to review the results.  Testing/Procedures: Your physician has requested that you have an echocardiogram. Echocardiography is a painless test that uses sound waves to create images of your heart. It provides your doctor with information about the size and shape of your heart and how well your heart's chambers and valves are working. This procedure takes approximately one hour. There are no restrictions for this procedure. Please do NOT wear cologne, perfume, aftershave, or lotions (deodorant is allowed). Please arrive 15 minutes prior to your appointment time.  Please note: We ask at that you not bring children with you during ultrasound (echo/ vascular) testing. Due to room size and safety concerns, children are not allowed in the ultrasound rooms during exams. Our front office staff cannot provide observation of children in our lobby area while testing is being conducted. An adult accompanying a patient to their appointment will only be allowed in the ultrasound room at the discretion of the ultrasound technician under special circumstances. We apologize for any inconvenience.   Follow-Up: At [facility], you and your health needs are our priority.  As part of our continuing mission to provide you with exceptional heart care, our providers are all part of one team.  This team includes your primary Cardiologist  (physician) and Advanced Practice Providers or APPs (Physician Assistants and Nurse Practitioners) who all work together to provide you with the care you need, when you need it.  Your next appointment:   6 month(s)  Provider:   Bertha Broad, MD    We recommend signing up for the patient portal called "MyChart".  Sign up information is provided on this After Visit Summary.  MyChart is used to connect with patients for Virtual Visits (Telemedicine).  Patients are able to view lab/test results, encounter notes, upcoming appointments, etc.  Non-urgent messages can be sent to your provider as well.   To learn more about what you can do with MyChart, go to [email or web address].   Other Instructions None

## 2024-02-25 ENCOUNTER — Ambulatory Visit

## 2024-02-25 DIAGNOSIS — I5042 Chronic combined systolic (congestive) and diastolic (congestive) heart failure: Secondary | ICD-10-CM | POA: Diagnosis present

## 2024-02-25 DIAGNOSIS — I350 Nonrheumatic aortic (valve) stenosis: Secondary | ICD-10-CM | POA: Insufficient documentation

## 2024-02-27 ENCOUNTER — Ambulatory Visit: Payer: Self-pay

## 2024-02-27 LAB — ECHOCARDIOGRAM COMPLETE
AR max vel: 2.25 cm2
AV Area VTI: 2.13 cm2
AV Area mean vel: 2.07 cm2
AV Mean grad: 10 mmHg
AV Peak grad: 17.1 mmHg
Ao pk vel: 2.07 m/s
Area-P 1/2: 5.2 cm2
MV M vel: 5.18 m/s
MV Peak grad: 107.3 mmHg
Radius: 0.55 cm
S' Lateral: 4.9 cm

## 2024-02-27 NOTE — Progress Notes (Signed)
 Please inform him echocardiogram results show slightly improved pumping function of the heart with left ventricular ejection fraction 50 to 55%, increased stiffness of the heart muscle. There is mild stenosis [stiffness] of aortic valve and mild to moderate leakiness of the mitral valve. Overall pressures across the lungs appear increased.  Would recommend cutting down on salt intake Follow-up closely with nephrologist with regards to water pill [torsemide ] management. If he noticing any significant signs or symptoms of worsening shortness of breath or fluid buildup in the legs, notify us  right away..  Will follow-up in the office in 6 months as noted at last office visit. Please forward results to his PCPs office.  Thank you

## 2024-03-01 NOTE — Telephone Encounter (Signed)
 Results reviewed with pt as per Dr. Madireddy's note.  Pt verbalized understanding and had no additional questions. Routed to PCP

## 2024-03-21 ENCOUNTER — Telehealth: Payer: Self-pay

## 2024-03-21 ENCOUNTER — Inpatient Hospital Stay (HOSPITAL_BASED_OUTPATIENT_CLINIC_OR_DEPARTMENT_OTHER)
Admission: EM | Admit: 2024-03-21 | Discharge: 2024-03-24 | DRG: 291 | Disposition: A | Discharge status: Home / Self Care | Attending: Internal Medicine | Admitting: Internal Medicine

## 2024-03-21 ENCOUNTER — Emergency Department (HOSPITAL_BASED_OUTPATIENT_CLINIC_OR_DEPARTMENT_OTHER): Admitting: Radiology

## 2024-03-21 ENCOUNTER — Encounter (HOSPITAL_BASED_OUTPATIENT_CLINIC_OR_DEPARTMENT_OTHER): Payer: Self-pay

## 2024-03-21 DIAGNOSIS — E1140 Type 2 diabetes mellitus with diabetic neuropathy, unspecified: Secondary | ICD-10-CM | POA: Diagnosis present

## 2024-03-21 DIAGNOSIS — Z8616 Personal history of COVID-19: Secondary | ICD-10-CM

## 2024-03-21 DIAGNOSIS — D631 Anemia in chronic kidney disease: Secondary | ICD-10-CM | POA: Diagnosis present

## 2024-03-21 DIAGNOSIS — J9601 Acute respiratory failure with hypoxia: Secondary | ICD-10-CM | POA: Diagnosis present

## 2024-03-21 DIAGNOSIS — Z823 Family history of stroke: Secondary | ICD-10-CM | POA: Diagnosis not present

## 2024-03-21 DIAGNOSIS — J90 Pleural effusion, not elsewhere classified: Principal | ICD-10-CM

## 2024-03-21 DIAGNOSIS — N179 Acute kidney failure, unspecified: Secondary | ICD-10-CM | POA: Diagnosis present

## 2024-03-21 DIAGNOSIS — R0600 Dyspnea, unspecified: Secondary | ICD-10-CM

## 2024-03-21 DIAGNOSIS — I2489 Other forms of acute ischemic heart disease: Secondary | ICD-10-CM | POA: Diagnosis present

## 2024-03-21 DIAGNOSIS — Z79899 Other long term (current) drug therapy: Secondary | ICD-10-CM | POA: Diagnosis not present

## 2024-03-21 DIAGNOSIS — R5381 Other malaise: Secondary | ICD-10-CM | POA: Insufficient documentation

## 2024-03-21 DIAGNOSIS — E1169 Type 2 diabetes mellitus with other specified complication: Secondary | ICD-10-CM | POA: Diagnosis present

## 2024-03-21 DIAGNOSIS — E1122 Type 2 diabetes mellitus with diabetic chronic kidney disease: Secondary | ICD-10-CM | POA: Diagnosis present

## 2024-03-21 DIAGNOSIS — E785 Hyperlipidemia, unspecified: Secondary | ICD-10-CM | POA: Diagnosis present

## 2024-03-21 DIAGNOSIS — Z7982 Long term (current) use of aspirin: Secondary | ICD-10-CM | POA: Diagnosis not present

## 2024-03-21 DIAGNOSIS — I350 Nonrheumatic aortic (valve) stenosis: Secondary | ICD-10-CM | POA: Diagnosis present

## 2024-03-21 DIAGNOSIS — I5033 Acute on chronic diastolic (congestive) heart failure: Secondary | ICD-10-CM | POA: Diagnosis not present

## 2024-03-21 DIAGNOSIS — Z886 Allergy status to analgesic agent status: Secondary | ICD-10-CM

## 2024-03-21 DIAGNOSIS — N185 Chronic kidney disease, stage 5: Secondary | ICD-10-CM | POA: Diagnosis present

## 2024-03-21 DIAGNOSIS — Z888 Allergy status to other drugs, medicaments and biological substances status: Secondary | ICD-10-CM | POA: Diagnosis not present

## 2024-03-21 DIAGNOSIS — Z8249 Family history of ischemic heart disease and other diseases of the circulatory system: Secondary | ICD-10-CM | POA: Diagnosis not present

## 2024-03-21 DIAGNOSIS — I10 Essential (primary) hypertension: Secondary | ICD-10-CM | POA: Diagnosis not present

## 2024-03-21 DIAGNOSIS — E669 Obesity, unspecified: Secondary | ICD-10-CM | POA: Diagnosis present

## 2024-03-21 DIAGNOSIS — E213 Hyperparathyroidism, unspecified: Secondary | ICD-10-CM | POA: Diagnosis present

## 2024-03-21 DIAGNOSIS — I4891 Unspecified atrial fibrillation: Secondary | ICD-10-CM | POA: Diagnosis present

## 2024-03-21 DIAGNOSIS — D509 Iron deficiency anemia, unspecified: Secondary | ICD-10-CM | POA: Diagnosis present

## 2024-03-21 DIAGNOSIS — Z9981 Dependence on supplemental oxygen: Secondary | ICD-10-CM

## 2024-03-21 DIAGNOSIS — I132 Hypertensive heart and chronic kidney disease with heart failure and with stage 5 chronic kidney disease, or end stage renal disease: Secondary | ICD-10-CM | POA: Diagnosis present

## 2024-03-21 LAB — BASIC METABOLIC PANEL WITH GFR
Anion gap: 15 (ref 5–15)
BUN: 73 mg/dL — ABNORMAL HIGH (ref 8–23)
CO2: 23 mmol/L (ref 22–32)
Calcium: 9.5 mg/dL (ref 8.9–10.3)
Chloride: 101 mmol/L (ref 98–111)
Creatinine, Ser: 4.97 mg/dL — ABNORMAL HIGH (ref 0.61–1.24)
GFR, Estimated: 11 mL/min — ABNORMAL LOW (ref >60)
Glucose, Bld: 106 mg/dL — ABNORMAL HIGH (ref 70–99)
Potassium: 4.4 mmol/L (ref 3.5–5.1)
Sodium: 139 mmol/L (ref 135–145)

## 2024-03-21 LAB — CBC
HCT: 32.4 % — ABNORMAL LOW (ref 39.0–52.0)
Hemoglobin: 10.4 g/dL — ABNORMAL LOW (ref 13.0–17.0)
MCH: 29.5 pg (ref 26.0–34.0)
MCHC: 32.1 g/dL (ref 30.0–36.0)
MCV: 92 fL (ref 80.0–100.0)
Platelets: 150 K/uL (ref 150–400)
RBC: 3.52 MIL/uL — ABNORMAL LOW (ref 4.22–5.81)
RDW: 14.6 % (ref 11.5–15.5)
WBC: 5.8 K/uL (ref 4.0–10.5)
nRBC: 0 % (ref 0.0–0.2)

## 2024-03-21 LAB — TROPONIN T, HIGH SENSITIVITY
Troponin T High Sensitivity: 96 ng/L — ABNORMAL HIGH (ref <19)
Troponin T High Sensitivity: 94 ng/L — ABNORMAL HIGH (ref <19)

## 2024-03-21 LAB — PRO BRAIN NATRIURETIC PEPTIDE: Pro Brain Natriuretic Peptide: >35000 pg/mL — ABNORMAL HIGH (ref <300.0)

## 2024-03-21 MED ORDER — FUROSEMIDE 10 MG/ML IJ SOLN
40.0000 mg | Freq: Two times a day (BID) | INTRAMUSCULAR | Status: DC
  Administered 2024-03-22: 40 mg via INTRAVENOUS
  Filled 2024-03-21: qty 4

## 2024-03-21 MED ORDER — CARVEDILOL 6.25 MG PO TABS
6.2500 mg | ORAL_TABLET | Freq: Two times a day (BID) | ORAL | Status: DC
  Administered 2024-03-22 – 2024-03-24 (×5): 6.25 mg via ORAL
  Filled 2024-03-21 (×5): qty 1

## 2024-03-21 MED ORDER — HEPARIN SODIUM (PORCINE) 5000 UNIT/ML IJ SOLN
5000.0000 [IU] | Freq: Three times a day (TID) | INTRAMUSCULAR | Status: DC
  Administered 2024-03-22 – 2024-03-24 (×7): 5000 [IU] via SUBCUTANEOUS
  Filled 2024-03-21 (×7): qty 1

## 2024-03-21 MED ORDER — MELATONIN 3 MG PO TABS: 6.0000 mg | ORAL_TABLET | Freq: Every evening | ORAL | Status: DC | PRN

## 2024-03-21 MED ORDER — FUROSEMIDE 10 MG/ML IJ SOLN
40.0000 mg | Freq: Once | INTRAMUSCULAR | Status: AC
  Administered 2024-03-21: 40 mg via INTRAVENOUS
  Filled 2024-03-21: qty 4

## 2024-03-21 MED ORDER — ALBUTEROL SULFATE (2.5 MG/3ML) 0.083% IN NEBU: 2.5000 mg | INHALATION_SOLUTION | RESPIRATORY_TRACT | Status: DC | PRN

## 2024-03-21 MED ORDER — ACETAMINOPHEN 500 MG PO TABS: 1000.0000 mg | ORAL_TABLET | Freq: Four times a day (QID) | ORAL | Status: DC | PRN

## 2024-03-21 MED ORDER — POLYETHYLENE GLYCOL 3350 17 G PO PACK: 17.0000 g | PACK | Freq: Every day | ORAL | Status: DC | PRN

## 2024-03-21 MED ORDER — FUROSEMIDE 10 MG/ML IJ SOLN
80.0000 mg | Freq: Once | INTRAMUSCULAR | Status: AC
  Administered 2024-03-21: 80 mg via INTRAVENOUS
  Filled 2024-03-21: qty 8

## 2024-03-21 MED ORDER — SODIUM CHLORIDE 0.9% FLUSH
3.0000 mL | Freq: Two times a day (BID) | INTRAVENOUS | Status: DC
  Administered 2024-03-22 – 2024-03-24 (×5): 3 mL via INTRAVENOUS

## 2024-03-21 MED ORDER — HYDRALAZINE HCL 50 MG PO TABS
50.0000 mg | ORAL_TABLET | Freq: Two times a day (BID) | ORAL | Status: DC
  Administered 2024-03-21 – 2024-03-24 (×6): 50 mg via ORAL
  Filled 2024-03-21 (×4): qty 1
  Filled 2024-03-21: qty 2
  Filled 2024-03-21: qty 1

## 2024-03-21 NOTE — Plan of Care (Signed)
 83 year old with past medical history of CKD stage V with baseline creatinine in the range of 4, HFpEF who presented to [facility] with complaint of shortness of breath.  Found to be in overloaded on examination.  Lab work showed creatinine clearance of 49 which is his baseline.  Extremely elevated proBNP more than 35000 .  Chest x-ray showed moderate pleural effusion.  Plan is to admit for further management of acute on chronic HFpEF.  Started IV lasix .

## 2024-03-21 NOTE — Telephone Encounter (Signed)
 See previous phone note.

## 2024-03-21 NOTE — ED Notes (Signed)
 ED Provider at bedside.

## 2024-03-21 NOTE — Telephone Encounter (Signed)
 Pt c/o Shortness Of Breath: STAT if SOB developed within the last 24 hours or pt is noticeably SOB on the phone  1. Are you currently SOB (can you hear that pt is SOB on the phone)? Yes, can hear over the phone  2. How long have you been experiencing SOB? A couple of weeks ago, got bad over the weekend.   3. Are you SOB when sitting or when up moving around? Both, but worse when moving around   4. Are you currently experiencing any other symptoms? Swelling in ankles. States he has gained 5 lbs or more in a week due to swelling.   Patient states his wife is a Engineer, civil (consulting) and believes he is in heart failure. Call transferred to triage due to SOB being very noticeable over the phone. Patient is asking to be seen today.

## 2024-03-21 NOTE — ED Notes (Signed)
 Patient placed on 2L [place] at this time due to desaturations of 88-90%. RN aware.

## 2024-03-21 NOTE — ED Notes (Signed)
 [facility] at bedside

## 2024-03-21 NOTE — Telephone Encounter (Signed)
 Patient transferred by call center. He is audibly short of breath. He complained of short of breath for a few days with worsening. Advised to go to the ED. pt was reluctant to go to [facility] due to the wait time. Recommended [facility] or any other ED of his choice. Pt agreed and verbalized understanding. He was tankful for the assistance.

## 2024-03-21 NOTE — ED Triage Notes (Signed)
 Pt c/o SHOB, increased swelling x3wks. Hx CHF, compliant w home lasix . Called cardiologist, advised to be evaluated. Pt noticeably SHOB in triage, increased work of breathing.

## 2024-03-21 NOTE — ED Provider Notes (Signed)
 [place] EMERGENCY DEPARTMENT AT [facility] Provider Note  CSN: [id number] Arrival date & time: 03/21/24 1407  Chief Complaint(s) Shortness of Breath and Leg Swelling  HPI Jason Beltran is a 83 y.o. male with PMH CKD 5 not on dialysis but fistula in place, CHF, hyperparathyroidism, HTN, T2DM who presents emerged part for evaluation of lower extremity edema and exertional shortness of breath.  States that symptoms been progressively worsening over the last 3 weeks but got even worse over the last 48 hours.  Having difficulty lying flat with persistent cough.  States he has been taking his home diuretic and is urinating but he is gaining weight.  Denies chest pain, headache, fever or other systemic symptoms.   Past Medical History Past Medical History:  Diagnosis Date   Abnormal EKG 01/03/2022   AKI (acute kidney injury) (HCC) 01/03/2022   Anemia of chronic disease    Aortic stenosis 05/08/2018   Very mild mean 9 mm Hg   CHF exacerbation (HCC) 01/07/2022   Chronic sinusitis    CKD (chronic kidney disease)    CKD (chronic kidney disease) stage 5, GFR less than 15 ml/min (HCC) 05/06/2018   COVID-19 virus infection 12/07/2020   Diabetes mellitus type 2, uncontrolled    Diastolic CHF (HCC)    Diastolic dysfunction 05/08/2018   Essential hypertension 05/06/2018   GERD (gastroesophageal reflux disease) 01/03/2022   Heart murmur    Hyperparathyroidism (HCC)    Hypertension    Hyponatremia 01/03/2022   Nausea and vomiting 01/03/2022   Neuropathy, peripheral    New onset a-fib (HCC) 01/03/2022   Obesity    Shortness of breath 01/03/2022   Type 2 diabetes mellitus (HCC) 05/06/2018   Patient Active Problem List   Diagnosis Date Noted   CKD (chronic kidney disease)    Heart murmur    CHF (congestive heart failure) (HCC) 01/07/2022   Hyponatremia 01/03/2022   GERD (gastroesophageal reflux disease) 01/03/2022   AKI (acute kidney injury) (HCC) 01/03/2022   Abnormal EKG  01/03/2022   Shortness of breath 01/03/2022   COVID-19 virus infection 12/07/2020   Obesity    Neuropathy, peripheral    Hypertension    Hyperparathyroidism (HCC)    Diabetes mellitus type 2, uncontrolled    Chronic sinusitis    Anemia of chronic disease    Aortic stenosis 05/08/2018   Diastolic dysfunction 05/08/2018   Type 2 diabetes mellitus (HCC) 05/06/2018   Essential hypertension 05/06/2018   CKD (chronic kidney disease) stage 5, GFR less than 15 ml/min (HCC) 05/06/2018   Home Medication(s) Prior to Admission medications   Medication Sig Start Date End Date Taking? Authorizing Provider  sodium bicarbonate  650 MG tablet Take 650 mg by mouth 2 (two) times daily. 02/12/24  Yes [provider]  aspirin  81 MG EC tablet Take 81 mg by mouth every evening.    [provider]  carvedilol  (COREG ) 6.25 MG tablet Take 6.25 mg by mouth 2 (two) times daily with a meal.    [provider]  Cholecalciferol (VITAMIN D3) 50 MCG (2000 UT) TABS Take 2,000 Units by mouth daily.    [provider]  Coenzyme Q10 (COQ10) 200 MG CAPS Take 400 mg by mouth every evening.    [provider]  febuxostat  (ULORIC ) 40 MG tablet Take 20 mg by mouth daily.    [provider]  gabapentin  (NEURONTIN ) 100 MG capsule Take 100 mg by mouth 2 (two) times daily. 01/11/24   [provider]  hydrALAZINE  (APRESOLINE )  50 MG tablet Take 50 mg by mouth in the morning and at bedtime.    [provider]  ipratropium (ATROVENT) 0.06 % nasal spray Place 2 sprays into both nostrils daily as needed for rhinitis.    [provider]  Multiple Vitamin (MULTIVITAMIN) tablet Take 1 tablet by mouth daily.    [provider]  Resveratrol-Quercetin (RESVERATROL PLUS) 100-100 MG TABS Take 1 capsule by mouth daily.    [provider]  simvastatin  (ZOCOR ) 20 MG tablet Take 20 mg by mouth every evening.    [provider]  torsemide   (DEMADEX ) 20 MG tablet Take 1 tablet (20 mg total) by mouth 2 (two) times daily. 01/21/23   Monetta Redell PARAS, MD                                                                                                                                    Past Surgical History Past Surgical History:  Procedure Laterality Date   ANKLE SURGERY Right 01/20/2012   AV FISTULA PLACEMENT Left 11/11/2022   Procedure: LEFT ARM BRACHIOCEPHALIC FISTULA CREATION;  Surgeon: Lanis Fonda BRAVO, MD;  Location: [facility] OR;  Service: Vascular;  Laterality: Left;   NASAL SINUS SURGERY     VASECTOMY     Family History Family History  Problem Relation Age of Onset   Polymyalgia rheumatica Mother    Congestive Heart Failure Mother    Heart failure Father    Stroke Paternal Grandmother    Stroke Paternal Aunt     Social History Social History   Tobacco Use   Smoking status: Never   Smokeless tobacco: Never  Vaping Use   Vaping status: Never Used  Substance Use Topics   Alcohol use: Yes    Comment: occasional glass of wine   Drug use: Never   Allergies Actos [pioglitazone], Lisinopril, Motrin [ibuprofen], and Trilipix [choline fenofibrate]  Review of Systems Review of Systems  Respiratory:  Positive for cough.   Cardiovascular:  Positive for leg swelling.    Physical Exam Vital Signs  I have reviewed the triage vital signs BP (!) 147/69   Pulse 65   Temp 98.9 F (37.2 C)   Resp 18   SpO2 96%   Physical Exam Constitutional:      General: He is not in acute distress.    Appearance: Normal appearance.  HENT:     Head: Normocephalic and atraumatic.     Nose: No congestion or rhinorrhea.  Eyes:     General:        Right eye: No discharge.        Left eye: No discharge.     Extraocular Movements: Extraocular movements intact.     Pupils: Pupils are equal, round, and reactive to light.  Cardiovascular:     Rate and Rhythm: Normal rate and regular rhythm.     Heart sounds: No murmur heard. Pulmonary:  Effort: No respiratory distress.     Breath sounds: Rales present. No wheezing.  Abdominal:     General: There is no distension.     Tenderness: There is no abdominal tenderness.  Musculoskeletal:        General: Normal range of motion.     Cervical back: Normal range of motion.     Right lower leg: Tenderness present.     Left lower leg: Tenderness present.  Skin:    General: Skin is warm and dry.  Neurological:     General: No focal deficit present.     Mental Status: He is alert.     ED Results and Treatments Labs (all labs ordered are listed, but only abnormal results are displayed) Labs Reviewed  BASIC METABOLIC PANEL WITH GFR - Abnormal; Notable for the following components:      Result Value   Glucose, Bld 106 (*)    BUN 73 (*)    Creatinine, Ser 4.97 (*)    GFR, Estimated 11 (*)    All other components within normal limits  CBC - Abnormal; Notable for the following components:   RBC 3.52 (*)    Hemoglobin 10.4 (*)    HCT 32.4 (*)    All other components within normal limits  PRO BRAIN NATRIURETIC PEPTIDE - Abnormal; Notable for the following components:   Pro Brain Natriuretic Peptide >35,000.0 (*)    All other components within normal limits  TROPONIN T, HIGH SENSITIVITY - Abnormal; Notable for the following components:   Troponin T High Sensitivity 96 (*)    All other components within normal limits  TROPONIN T, HIGH SENSITIVITY                                                                                                                          Radiology No results found.  Pertinent labs & imaging results that were available during my care of the patient were reviewed by me and considered in my medical decision making (see MDM for details).  Medications Ordered in ED Medications  furosemide  (LASIX ) injection 40 mg (40 mg Intravenous Given 03/21/24 1554)                                                                                                                                      Procedures Procedures  (including critical care time)  Medical Decision Making /  ED Course   This patient presents to the ED for concern of shortness of breath, cough, this involves an extensive number of treatment options, and is a complaint that carries with it a high risk of complications and morbidity.  The differential diagnosis includes CHF exacerbation, COPD exacerbation, pneumonia, pleural effusion  MDM: Patient seen emerged part for evaluation of shortness of breath and cough.  Physical exam with rales at the bases with some mild decreased breath sounds on the right, lower extremity 1+ pitting edema.  Laboratory evaluation with a BUN of 73, creatinine 4.97 which is near patient's baseline as last documented GFR is 13 and today is 11.  Hemoglobin 10.4, high sensitive troponin 96 likely demand ischemia.  BNP is undetectably high.  Chest x-ray with right-sided pleural effusion.  IV Lasix  initiated and patient require hospital admission for thoracentesis and failure of outpatient diuretic regimen with persistent dyspnea.  Patient admitted   Additional history obtained: -Additional history obtained from family members -External records from outside source obtained and reviewed including: Chart review including previous notes, labs, imaging, consultation notes   Lab Tests: -I ordered, reviewed, and interpreted labs.   The pertinent results include:   Labs Reviewed  BASIC METABOLIC PANEL WITH GFR - Abnormal; Notable for the following components:      Result Value   Glucose, Bld 106 (*)    BUN 73 (*)    Creatinine, Ser 4.97 (*)    GFR, Estimated 11 (*)    All other components within normal limits  CBC - Abnormal; Notable for the following components:   RBC 3.52 (*)    Hemoglobin 10.4 (*)    HCT 32.4 (*)    All other components within normal limits  PRO BRAIN NATRIURETIC PEPTIDE - Abnormal; Notable for the following components:   Pro Brain  Natriuretic Peptide >35,000.0 (*)    All other components within normal limits  TROPONIN T, HIGH SENSITIVITY - Abnormal; Notable for the following components:   Troponin T High Sensitivity 96 (*)    All other components within normal limits  TROPONIN T, HIGH SENSITIVITY      EKG   EKG Interpretation Date/Time:  Monday March 21 2024 14:17:47 EDT Ventricular Rate:  67 PR Interval:  190 QRS Duration:  100 QT Interval:  468 QTC Calculation: 494 R Axis:   93  Text Interpretation: Normal sinus rhythm Prolonged QT Abnormal ECG When compared with ECG of 29-Jan-2024 11:40, ST now depressed in Inferior leads Confirmed by Aundreya Souffrant ([id number]) on 03/21/2024 3:20:39 PM         Imaging Studies ordered: I ordered imaging studies including chest x-ray I independently visualized and interpreted imaging. I agree with the radiologist interpretation   Medicines ordered and prescription drug management: Meds ordered this encounter  Medications   furosemide  (LASIX ) injection 40 mg    -I have reviewed the patients home medicines and have made adjustments as needed  Critical interventions none   Cardiac Monitoring: The patient was maintained on a cardiac monitor.  I personally viewed and interpreted the cardiac monitored which showed an underlying rhythm of: NSR  Social Determinants of Health:  Factors impacting patients care include: none   Reevaluation: After the interventions noted above, I reevaluated the patient and found that they have :improved  Co morbidities that complicate the patient evaluation  Past Medical History:  Diagnosis Date   Abnormal EKG 01/03/2022   AKI (acute kidney injury) (HCC) 01/03/2022   Anemia of chronic disease  Aortic stenosis 05/08/2018   Very mild mean 9 mm Hg   CHF exacerbation (HCC) 01/07/2022   Chronic sinusitis    CKD (chronic kidney disease)    CKD (chronic kidney disease) stage 5, GFR less than 15 ml/min (HCC) 05/06/2018   COVID-19  virus infection 12/07/2020   Diabetes mellitus type 2, uncontrolled    Diastolic CHF (HCC)    Diastolic dysfunction 05/08/2018   Essential hypertension 05/06/2018   GERD (gastroesophageal reflux disease) 01/03/2022   Heart murmur    Hyperparathyroidism (HCC)    Hypertension    Hyponatremia 01/03/2022   Nausea and vomiting 01/03/2022   Neuropathy, peripheral    New onset a-fib (HCC) 01/03/2022   Obesity    Shortness of breath 01/03/2022   Type 2 diabetes mellitus (HCC) 05/06/2018      Dispostion: I considered admission for this patient, and patient require hospital admission for pleural effusion and fluid overload     Final Clinical Impression(s) / ED Diagnoses Final diagnoses:  Pleural effusion  Dyspnea, unspecified type     @PCDICTATION @    Albertina Dixon, MD 03/21/24 [phone]

## 2024-03-21 NOTE — ED Notes (Addendum)
 Pt's oxygen sat in 89 to low 90s while on Room Air. Pt placed on 2L of O2 via [place]. RT at bedside.

## 2024-03-22 ENCOUNTER — Other Ambulatory Visit: Payer: Self-pay

## 2024-03-22 DIAGNOSIS — J9601 Acute respiratory failure with hypoxia: Secondary | ICD-10-CM | POA: Diagnosis not present

## 2024-03-22 DIAGNOSIS — R5381 Other malaise: Secondary | ICD-10-CM | POA: Insufficient documentation

## 2024-03-22 DIAGNOSIS — I5033 Acute on chronic diastolic (congestive) heart failure: Secondary | ICD-10-CM | POA: Diagnosis not present

## 2024-03-22 LAB — CBC
HCT: 29.8 % — ABNORMAL LOW (ref 39.0–52.0)
Hemoglobin: 9.7 g/dL — ABNORMAL LOW (ref 13.0–17.0)
MCH: 29.5 pg (ref 26.0–34.0)
MCHC: 32.6 g/dL (ref 30.0–36.0)
MCV: 90.6 fL (ref 80.0–100.0)
Platelets: 148 K/uL — ABNORMAL LOW (ref 150–400)
RBC: 3.29 MIL/uL — ABNORMAL LOW (ref 4.22–5.81)
RDW: 14.3 % (ref 11.5–15.5)
WBC: 7.4 K/uL (ref 4.0–10.5)
nRBC: 0 % (ref 0.0–0.2)

## 2024-03-22 LAB — BASIC METABOLIC PANEL WITH GFR
Anion gap: 13 (ref 5–15)
BUN: 77 mg/dL — ABNORMAL HIGH (ref 8–23)
CO2: 21 mmol/L — ABNORMAL LOW (ref 22–32)
Calcium: 8.8 mg/dL — ABNORMAL LOW (ref 8.9–10.3)
Chloride: 106 mmol/L (ref 98–111)
Creatinine, Ser: 5.07 mg/dL — ABNORMAL HIGH (ref 0.61–1.24)
GFR, Estimated: 11 mL/min — ABNORMAL LOW (ref >60)
Glucose, Bld: 130 mg/dL — ABNORMAL HIGH (ref 70–99)
Potassium: 3.5 mmol/L (ref 3.5–5.1)
Sodium: 140 mmol/L (ref 135–145)

## 2024-03-22 LAB — TSH: TSH: 3.079 u[IU]/mL (ref 0.350–4.500)

## 2024-03-22 LAB — MAGNESIUM: Magnesium: 2.4 mg/dL (ref 1.7–2.4)

## 2024-03-22 LAB — PHOSPHORUS: Phosphorus: 5 mg/dL — ABNORMAL HIGH (ref 2.5–4.6)

## 2024-03-22 MED ORDER — ASPIRIN 81 MG PO TBEC
81.0000 mg | DELAYED_RELEASE_TABLET | Freq: Every evening | ORAL | Status: DC
  Administered 2024-03-22 – 2024-03-23 (×2): 81 mg via ORAL
  Filled 2024-03-22 (×2): qty 1

## 2024-03-22 MED ORDER — SODIUM BICARBONATE 650 MG PO TABS
650.0000 mg | ORAL_TABLET | Freq: Two times a day (BID) | ORAL | Status: DC
  Administered 2024-03-22 – 2024-03-24 (×5): 650 mg via ORAL
  Filled 2024-03-22 (×5): qty 1

## 2024-03-22 MED ORDER — SIMVASTATIN 20 MG PO TABS
20.0000 mg | ORAL_TABLET | Freq: Every evening | ORAL | Status: DC
  Administered 2024-03-22 – 2024-03-23 (×2): 20 mg via ORAL
  Filled 2024-03-22 (×2): qty 1

## 2024-03-22 MED ORDER — MENTHOL 3 MG MT LOZG
1.0000 | LOZENGE | OROMUCOSAL | Status: DC | PRN
  Administered 2024-03-22: 3 mg via ORAL
  Filled 2024-03-22: qty 9

## 2024-03-22 MED ORDER — FUROSEMIDE 10 MG/ML IJ SOLN
80.0000 mg | Freq: Two times a day (BID) | INTRAMUSCULAR | Status: DC
  Administered 2024-03-22 – 2024-03-24 (×4): 80 mg via INTRAVENOUS
  Filled 2024-03-22 (×4): qty 8

## 2024-03-22 MED ORDER — FEBUXOSTAT 40 MG PO TABS
20.0000 mg | ORAL_TABLET | Freq: Every day | ORAL | Status: DC
  Administered 2024-03-22 – 2024-03-24 (×3): 20 mg via ORAL
  Filled 2024-03-22 (×3): qty 1

## 2024-03-22 MED ORDER — GABAPENTIN 100 MG PO CAPS
100.0000 mg | ORAL_CAPSULE | Freq: Three times a day (TID) | ORAL | Status: DC
  Administered 2024-03-22 – 2024-03-24 (×7): 100 mg via ORAL
  Filled 2024-03-22 (×7): qty 1

## 2024-03-22 NOTE — Evaluation (Signed)
 Physical Therapy Brief Evaluation and Discharge Note Patient Details Name: Jason Beltran MRN: [id number] DOB: December 13, 1940 Today's Date: 03/22/2024   History of Present Illness  83 yo male adm 03/21/24 with heart failure exacerbation. 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 Pt does not use O2 or AD at home and able to walk in hall and complete stairs on RA without need for assist at this time. Pt endorses eating whatever he wants and educated for heart healthy low sodium diet. Encouraged daily walking program acutely and at D/C. No further therapy needs at this time, will defer to mobility.     PT Assessment Patient does not need any further PT services  Assistance Needed at Discharge  PRN    Equipment Recommendations None recommended by PT  Recommendations for Other Services       Precautions/Restrictions Precautions Precautions: Fall Recall of Precautions/Restrictions: Intact        Mobility  Bed Mobility Rolling: Modified independent (Device/Increase time) Supine/Sidelying to sit: Modified independent (Device/Increased time) Sit to supine/sidelying: Modified independent (Device/Increased time)    Transfers Overall transfer level: Modified independent                      Ambulation/Gait Ambulation/Gait assistance: Modified independent (Device/Increase time) Gait Distance (Feet): 350 Feet Assistive device: None Gait Pattern/deviations: Step-through pattern, Decreased stride length Gait Speed: Pace WFL    Home Activity Instructions    Stairs Stairs: Yes Stairs assistance: Modified independent (Device/Increase time) Stair Management: Alternating pattern, Forwards, Two rails Number of Stairs: 11 General stair comments: brief desaturation to 88% on RA with stairs but recovered with standing  rest  Modified Rankin (Stroke Patients Only)        Balance Overall balance assessment: Mild deficits observed, not formally tested                        Pertinent Vitals/Pain PT - Brief Vital Signs All Vital Signs Stable: Yes (SPO2 >92% on RA throughout gait, brief drop to 88% after stairs but recovered with brief standing rest) Pain Assessment Pain Assessment: No/denies pain     Home Living Family/patient expects to be discharged to:: Private residence Living Arrangements: Spouse/significant other Available Help at Discharge: Family;Available 24 hours/day Home Environment: Stairs to enter  [employer] of Steps: 4 Home Equipment: Shower seat;Other (comment) (stair lift)   Additional Comments: flight of stairs to bedroom but stair lift available    Prior Function Level of Independence: Independent      UE/LE Assessment   UE ROM/Strength/Tone/Coordination: Generalized weakness    LE ROM/Strength/Tone/Coordination: Generalized weakness      Communication   Communication Communication: Impaired Factors Affecting Communication: Hearing impaired     Cognition Overall Cognitive Status: Appears within functional limits for tasks assessed/performed       General Comments      Exercises     Assessment/Plan    PT Problem List         PT Visit Diagnosis Other abnormalities of gait and mobility (R26.89)    No Skilled PT All education completed;Patient is modified independent with all activity/mobility   Co-evaluation                AMPAC 6 Clicks Help needed turning from your back to your side while in a  flat bed without using bedrails?: None Help needed moving from lying on your back to sitting on the side of a flat bed without using bedrails?: None Help needed moving to and from a bed to a chair (including a wheelchair)?: None Help needed standing up from a chair using your arms (e.g., wheelchair or bedside chair)?: None Help needed to  walk in hospital room?: None Help needed climbing 3-5 steps with a railing? : None 6 Click Score: 24      End of Session   Activity Tolerance: Patient tolerated treatment well Patient left: in bed;with call bell/phone within reach;with family/visitor present Nurse Communication: Mobility status PT Visit Diagnosis: Other abnormalities of gait and mobility (R26.89)     Time: 9078-9056 PT Time Calculation (min) (ACUTE ONLY): 22 min  Charges:   PT Evaluation $PT Eval Low Complexity: 1 Low      Darlyn Repsher P, PT Acute Rehabilitation Services Office: [phone]   Lenoard NOVAK Ree Alcalde  03/22/2024, 10:38 AM

## 2024-03-22 NOTE — H&P (Signed)
 History and Physical    Jason Beltran FMW:[id number] DOB: 10/08/1940 DOA: 03/21/2024  PCP: Benjamine Lauraine DASEN, NP   Patient coming from: Transfer from [facility] ED    Chief Complaint:  Chief Complaint  Patient presents with   Shortness of Breath   Leg Swelling    HPI:  Jason Beltran is a 83 y.o. male with hx of HFpEF, mild AS, CKD 5, fistula in place but not on HD, diabetes type 2, hypertension, hyperlipidemia, neuropathy, anemia, who was transferred from [facility] ED for heart failure exacerbation.  Patient reports that over the past week he is progressively developed dyspnea on exertion.  Especially worse over the past 2 days and limiting functional status, even dyspneic at rest.  Denies any chest pain.  Adherent with diuretic although reports taking Lasix  40 mg daily; appears most recent prescription was for torsemide  40 mg daily 90-day supply in 4/'25 (suspect taking Torsemide  at home and just misspoke during history).  Reports decreasing efficacy of diuretic   does report associated cough although states he gets this when he gets fluid overloaded, nonproductive.  Some rhinorrhea, no sore throat.  No sick contacts.  No fevers, chills.    Review of Systems:  ROS complete and negative except as marked above   Allergies  Allergen Reactions   Actos [Pioglitazone] Other (See Comments)    Contraindicated by cardiomyopathy   Lisinopril Other (See Comments)    Antihypertensives and ARB's discouraged by Nephrology   Motrin [Ibuprofen]     Prefers not to take   Trilipix [Choline Fenofibrate] Other (See Comments)    Contributed to elevated LFT's    Prior to Admission medications   Medication Sig Start Date End Date Taking? Authorizing Provider  sodium bicarbonate  650 MG tablet Take 650 mg by mouth 2 (two) times daily. 02/12/24  Yes [provider]  aspirin  81 MG EC tablet Take 81 mg by mouth every evening.    [provider]  carvedilol  (COREG ) 6.25 MG tablet Take 6.25 mg by mouth 2  (two) times daily with a meal.    [provider]  Cholecalciferol (VITAMIN D3) 50 MCG (2000 UT) TABS Take 2,000 Units by mouth daily.    [provider]  Coenzyme Q10 (COQ10) 200 MG CAPS Take 400 mg by mouth every evening.    [provider]  febuxostat  (ULORIC ) 40 MG tablet Take 20 mg by mouth daily.    [provider]  gabapentin  (NEURONTIN ) 100 MG capsule Take 100 mg by mouth 2 (two) times daily. 01/11/24   [provider]  hydrALAZINE  (APRESOLINE ) 50 MG tablet Take 50 mg by mouth in the morning and at bedtime.    [provider]  ipratropium (ATROVENT) 0.06 % nasal spray Place 2 sprays into both nostrils daily as needed for rhinitis.    [provider]  Multiple Vitamin (MULTIVITAMIN) tablet Take 1 tablet by mouth daily.    [provider]  Resveratrol-Quercetin (RESVERATROL PLUS) 100-100 MG TABS Take 1 capsule by mouth daily.    [provider]  simvastatin  (ZOCOR ) 20 MG tablet Take 20 mg by mouth every evening.    [provider]  torsemide  (DEMADEX ) 20 MG tablet Take 1 tablet (20 mg total) by mouth 2 (two) times daily. 01/21/23   Monetta Redell PARAS, MD    Past Medical History:  Diagnosis Date   Abnormal EKG 01/03/2022   AKI (acute kidney injury) (HCC) 01/03/2022   Anemia of chronic disease    Aortic stenosis  05/08/2018   Very mild mean 9 mm Hg   CHF exacerbation (HCC) 01/07/2022   Chronic sinusitis    CKD (chronic kidney disease)    CKD (chronic kidney disease) stage 5, GFR less than 15 ml/min (HCC) 05/06/2018   COVID-19 virus infection 12/07/2020   Diabetes mellitus type 2, uncontrolled    Diastolic CHF (HCC)    Diastolic dysfunction 05/08/2018   Essential hypertension 05/06/2018   GERD (gastroesophageal reflux disease) 01/03/2022   Heart murmur    Hyperparathyroidism (HCC)    Hypertension    Hyponatremia 01/03/2022   Nausea and vomiting 01/03/2022   Neuropathy, peripheral    New onset  a-fib (HCC) 01/03/2022   Obesity    Shortness of breath 01/03/2022   Type 2 diabetes mellitus (HCC) 05/06/2018    Past Surgical History:  Procedure Laterality Date   ANKLE SURGERY Right 01/20/2012   AV FISTULA PLACEMENT Left 11/11/2022   Procedure: LEFT ARM BRACHIOCEPHALIC FISTULA CREATION;  Surgeon: Lanis Fonda BRAVO, MD;  Location: [facility] OR;  Service: Vascular;  Laterality: Left;   NASAL SINUS SURGERY     VASECTOMY       reports that he has never smoked. He has never used smokeless tobacco. He reports current alcohol use. He reports that he does not use drugs.  Family History  Problem Relation Age of Onset   Polymyalgia rheumatica Mother    Congestive Heart Failure Mother    Heart failure Father    Stroke Paternal Grandmother    Stroke Paternal Aunt      Physical Exam: Vitals:   03/21/24 2200 03/21/24 2203 03/21/24 2205 03/21/24 2349  BP: (!) 147/56 (!) 147/56  (!) 157/60  Pulse: 69  70 71  Resp: (!) 24  18 20   Temp:    97.8 F (36.6 C)  TempSrc:    Oral  SpO2: (!) 89%  95%     Gen: Awake, alert, chronically ill-appearing, elderly CV: Regular, normal S1, S2, 1/6 SEM Resp: Normal WOB, diminished in the lower half on the right, no rales. Abd: Flat, normoactive, nontender MSK: Symmetric, there is dependent 1-2+ edema, limited pretibial edema Skin: No rashes or lesions to exposed skin  Neuro: Alert and interactive  Psych: euthymic, appropriate    Data review:   Labs reviewed, notable for:   proBNP greater than 35,000 High-sensitivity Trop 96 -> 94 Creatinine 4.97 (per signout baseline near 4, although no recent labs)  Micro:  No results found for this or any previous visit.  Imaging reviewed:  DG Chest 2 View Result Date: 03/21/2024 CLINICAL DATA:  Shortness of breath EXAM: CHEST - 2 VIEW COMPARISON:  Chest x-ray performed January 07, 2022 FINDINGS: Worsening small to moderate right-sided pleural effusion with associated consolidation. Heart mediastinum are not  significantly changed. Degenerative changes are present in the imaged osseous structures. Possible trace left effusion. Mild interstitial prominence. IMPRESSION: 1. Worsening small to moderate right-sided pleural effusion with associated consolidation. Electronically Signed   By: Maude Naegeli M.D.   On: 03/21/2024 17:01    EKG:  Personally reviewed, sinus rhythm, LAE, LVH, ST depression T wave inversion laterally.  ED Course:  Treated with Lasix  40 mg then 80 mg IV.  Transferred for further care for heart failure   Assessment/Plan:  83 y.o. male with hx HFpEF, mild AS, CKD 5, fistula in place but not on HD, diabetes type 2, hypertension, hyperlipidemia, neuropathy, anemia, who was transferred from [facility] ED for acute exacerbation of chronic HFpEF  HFpEF, with acute  exacerbation Acute hypoxic respiratory failure, on 2 L O2  Hx ~ 1 week progressive SOB, decreased diuretic effectiveness. Desat to 89% on RA, currently on 2L O2. ProBNP > 35K. HS trop flat 96 -> 94. CXR with small to moderate R pleural effusion and associated consolidation, suspect more likely atelectasis.  Etiology of his heart failure exacerbation suspect related to diuretic resistance from his CKD 5 - S/p Lasix  total of 120 mgIV in the ED.   Tentatively scheduled for 40 mg IV BID, adjust as needed. - No need for repeat TTE, recently done last month on 6/12 LVEF 50 to 55%, global hypokinesis, grade 2 diastolic dysfunction, severe elevation of RVSP at 76, mild to moderate MR, trace to mild AS. - GDMT limited by his renal insufficiency.  Continue his home carvedilol , hydralazine  - Heart failure navigator/TOC consult - Low-sodium diet, I/O, daily weights.  - Repeat chest x-ray once more euvolemic to reevaluate effusion  CKD stage V AV fistula in place, not on HD  Baseline Cr per report near 4, though no recent labs. Remote labs on file Cr 3.7. suspect currently near baseline at 4.9.  -Diuretic management per above  Recent  cough - Check flu/COVID/RSV - Albuterol  prn, incentive spirometry  Deconditioning - PT evaluation  Chronic medical problems: Diabetes type 2: Diet controlled, not current currently requiring SSI Hypertension: Continue home Coreg , hydralazine  Hyperlipidemia: Continue home simvastatin  Neuropathy: Continue gabapentin  Anemia: Hemoglobin 10 near previous, outpatient follow-up   There is no height or weight on file to calculate BMI.    DVT prophylaxis:  SQ Heparin  Code Status:  Full Code Diet:  Diet Orders (From admission, onward)     Start     Ordered   03/21/24 2354  Diet 2 gram sodium Room service appropriate? Yes; Fluid consistency: Thin  Diet effective now       Question Answer Comment  Room service appropriate? Yes   Fluid consistency: Thin      03/21/24 2354           Family Communication:  None   Consults:  None   Admission status:   Inpatient, Telemetry bed  Severity of Illness: The appropriate patient status for this patient is INPATIENT. Inpatient status is judged to be reasonable and necessary in order to provide the required intensity of service to ensure the patient's safety. The patient's presenting symptoms, physical exam findings, and initial radiographic and laboratory data in the context of their chronic comorbidities is felt to place them at high risk for further clinical deterioration. Furthermore, it is not anticipated that the patient will be medically stable for discharge from the hospital within 2 midnights of admission.   * I certify that at the point of admission it is my clinical judgment that the patient will require inpatient hospital care spanning beyond 2 midnights from the point of admission due to high intensity of service, high risk for further deterioration and high frequency of surveillance required.*   Dorn Dawson, MD [facility]  How to contact the [facility] Attending or Consulting provider 7A - 7P or covering provider during after  hours 7P -7A, for this patient.  Check the care team in [facility] and look for a) attending/consulting [facility] provider listed and b) the [facility] team listed Log into [email or web address] and use 's universal password to access. If you do not have the password, please contact the hospital operator. Locate the [facility] provider you are looking for under [facility] and page to a number that  you can be directly reached. If you still have difficulty reaching the provider, please page the [facility] (Director on Call) for the Hospitalists listed on amion for assistance.  03/22/2024, 3:38 AM

## 2024-03-22 NOTE — Progress Notes (Signed)
 Mobility Specialist Progress Note:    03/22/24 1428  Mobility  Activity Ambulated independently in hallway  Level of Assistance Independent after set-up  Assistive Device None  Distance Ambulated (ft) 250 ft  Activity Response Tolerated well  Mobility Referral Yes  Mobility visit 1 Mobility  Mobility Specialist Start Time (ACUTE ONLY) 1428  Mobility Specialist Stop Time (ACUTE ONLY) 1457  Mobility Specialist Time Calculation (min) (ACUTE ONLY) 29 min   Pt pleasant and agreeable to session. Pt able to move self well from sup>sit and standing as well. No c/o any symptoms. Pt very pleasant and talkative during session. Left pt on EOB comfortable w/ all needs met w/ wife and daughter.   Venetia Keel Mobility Specialist Please Neurosurgeon or Rehab Office at [phone]

## 2024-03-22 NOTE — Progress Notes (Signed)
 Heart Failure Navigator Progress Note  Assessed for Heart & Vascular TOC clinic readiness.  Patient does not meet criteria due to EF 50-55%, No HF TOC per Dr. Jomarie Longs. .   Navigator will sign off at this time.   Rhae Hammock, BSN, Scientist, clinical (histocompatibility and immunogenetics) Only

## 2024-03-22 NOTE — Consult Note (Signed)
 [facility]  INPATIENT CONSULTATION  Reason for Consultation: AKI on CKD Requesting Provider: Dr. Fairy  HPI: Jason Beltran is an 83 y.o. male with HTN, atrial fibrillation, DM, CKD 5, cHFpEF, HL, anemia currently admitted for volume overload and nephrology is consulted for comanagement of volume and CKD.  Presented with DOE in setting of less diuretic response to his torsemide  40 daily.  CXR with mild R pleural effusion with consolidation, interstitial prominence.  BUN 70s, Cr 5, Hb 10.4.  Treated with lasix  40 then 80 IV yesterday then 40 IV BID today.  UOP 1725 yesterday, 400 so far today.   He's seen in room with wife.  Prominent DOE over past few weeks, ^ dietary sodium and 5lb wt gain.  +LE edema and abd bloat.  No f/c.  Some nasal congestion and cough.  Adherent to his diuretic.    PMH: Past Medical History:  Diagnosis Date   Abnormal EKG 01/03/2022   AKI (acute kidney injury) (HCC) 01/03/2022   Anemia of chronic disease    Aortic stenosis 05/08/2018   Very mild mean 9 mm Hg   CHF exacerbation (HCC) 01/07/2022   Chronic sinusitis    CKD (chronic kidney disease)    CKD (chronic kidney disease) stage 5, GFR less than 15 ml/min (HCC) 05/06/2018   COVID-19 virus infection 12/07/2020   Diabetes mellitus type 2, uncontrolled    Diastolic CHF (HCC)    Diastolic dysfunction 05/08/2018   Essential hypertension 05/06/2018   GERD (gastroesophageal reflux disease) 01/03/2022   Heart murmur    Hyperparathyroidism (HCC)    Hypertension    Hyponatremia 01/03/2022   Nausea and vomiting 01/03/2022   Neuropathy, peripheral    New onset a-fib (HCC) 01/03/2022   Obesity    Shortness of breath 01/03/2022   Type 2 diabetes mellitus (HCC) 05/06/2018   PSH: Past Surgical History:  Procedure Laterality Date   ANKLE SURGERY Right 01/20/2012   AV FISTULA PLACEMENT Left 11/11/2022   Procedure: LEFT ARM BRACHIOCEPHALIC FISTULA CREATION;  Surgeon: Lanis Fonda BRAVO, MD;  Location:  [facility] OR;  Service: Vascular;  Laterality: Left;   NASAL SINUS SURGERY     VASECTOMY      Past Medical History:  Diagnosis Date   Abnormal EKG 01/03/2022   AKI (acute kidney injury) (HCC) 01/03/2022   Anemia of chronic disease    Aortic stenosis 05/08/2018   Very mild mean 9 mm Hg   CHF exacerbation (HCC) 01/07/2022   Chronic sinusitis    CKD (chronic kidney disease)    CKD (chronic kidney disease) stage 5, GFR less than 15 ml/min (HCC) 05/06/2018   COVID-19 virus infection 12/07/2020   Diabetes mellitus type 2, uncontrolled    Diastolic CHF (HCC)    Diastolic dysfunction 05/08/2018   Essential hypertension 05/06/2018   GERD (gastroesophageal reflux disease) 01/03/2022   Heart murmur    Hyperparathyroidism (HCC)    Hypertension    Hyponatremia 01/03/2022   Nausea and vomiting 01/03/2022   Neuropathy, peripheral    New onset a-fib (HCC) 01/03/2022   Obesity    Shortness of breath 01/03/2022   Type 2 diabetes mellitus (HCC) 05/06/2018    Medications:  I have reviewed the patient's current medications.  Medications Prior to Admission  Medication Sig Dispense Refill   sodium bicarbonate  650 MG tablet Take 650 mg by mouth 2 (two) times daily.     aspirin  81 MG EC tablet Take 81 mg by mouth every evening.     carvedilol  (  COREG ) 6.25 MG tablet Take 6.25 mg by mouth 2 (two) times daily with a meal.     Cholecalciferol (VITAMIN D3) 50 MCG (2000 UT) TABS Take 2,000 Units by mouth daily.     Coenzyme Q10 (COQ10) 200 MG CAPS Take 400 mg by mouth every evening.     febuxostat  (ULORIC ) 40 MG tablet Take 20 mg by mouth daily.     gabapentin  (NEURONTIN ) 100 MG capsule Take 100 mg by mouth 2 (two) times daily.     hydrALAZINE  (APRESOLINE ) 50 MG tablet Take 50 mg by mouth in the morning and at bedtime.     ipratropium (ATROVENT) 0.06 % nasal spray Place 2 sprays into both nostrils daily as needed for rhinitis.     Multiple Vitamin (MULTIVITAMIN) tablet Take 1 tablet by mouth daily.      Resveratrol-Quercetin (RESVERATROL PLUS) 100-100 MG TABS Take 1 capsule by mouth daily.     simvastatin  (ZOCOR ) 20 MG tablet Take 20 mg by mouth every evening.     torsemide  (DEMADEX ) 20 MG tablet Take 1 tablet (20 mg total) by mouth 2 (two) times daily. 180 tablet 2    ALLERGIES:   Allergies  Allergen Reactions   Actos [Pioglitazone] Other (See Comments)    Contraindicated by cardiomyopathy   Lisinopril Other (See Comments)    Antihypertensives and ARB's discouraged by Nephrology   Motrin [Ibuprofen]     Prefers not to take   Trilipix [Choline Fenofibrate] Other (See Comments)    Contributed to elevated LFT's    FAM HX: Family History  Problem Relation Age of Onset   Polymyalgia rheumatica Mother    Congestive Heart Failure Mother    Heart failure Father    Stroke Paternal Grandmother    Stroke Paternal Aunt     Social History:   reports that he has never smoked. He has never used smokeless tobacco. He reports current alcohol use. He reports that he does not use drugs.  ROS: 12 system ROS neg except per HPI above  Blood pressure (!) 149/66, pulse 73, temperature 99.4 F (37.4 C), temperature source Oral, resp. rate 20, weight 77.6 kg, SpO2 96%. PHYSICAL EXAM: Gen:  well appearing, napping awakes easily  Eyes: anicteric, EOMI ENT: MMM Neck: JVP to mandible at 30 deg CV:  RRR, no rub Abd: soft, mild distention Lungs: normal WOB on 2L [place], dec BS R base, rales in L base GU: no foley Extr:  trace LE edema, LUE BC AVF +t/b, mature Neuro: sharp, conversant   Results for orders placed or performed during the hospital encounter of 03/21/24 (from the past 48 hours)  Basic metabolic panel     Status: Abnormal   Collection Time: 03/21/24  2:30 PM  Result Value Ref Range   Sodium 139 135 - 145 mmol/L   Potassium 4.4 3.5 - 5.1 mmol/L   Chloride 101 98 - 111 mmol/L   CO2 23 22 - 32 mmol/L   Glucose, Bld 106 (H) 70 - 99 mg/dL    Comment: Glucose reference range applies only  to samples taken after fasting for at least 8 hours.   BUN 73 (H) 8 - 23 mg/dL   Creatinine, Ser 5.02 (H) 0.61 - 1.24 mg/dL   Calcium 9.5 8.9 - 89.6 mg/dL   GFR, Estimated 11 (L) >60 mL/min    Comment: (NOTE) Calculated using the CKD-EPI Creatinine Equation (2021)    Anion gap 15 5 - 15    Comment: Performed at [facility], [address], [place], [place] [address]  CBC     Status: Abnormal   Collection Time: 03/21/24  2:30 PM  Result Value Ref Range   WBC 5.8 4.0 - 10.5 K/uL   RBC 3.52 (L) 4.22 - 5.81 MIL/uL   Hemoglobin 10.4 (L) 13.0 - 17.0 g/dL   HCT 67.5 (L) 60.9 - 47.9 %   MCV 92.0 80.0 - 100.0 fL   MCH 29.5 26.0 - 34.0 pg   MCHC 32.1 30.0 - 36.0 g/dL   RDW 85.3 88.4 - 84.4 %   Platelets 150 150 - 400 K/uL   nRBC 0.0 0.0 - 0.2 %    Comment: Performed at [facility], [address] [place], [place] [address]  Pro Brain natriuretic peptide     Status: Abnormal   Collection Time: 03/21/24  2:30 PM  Result Value Ref Range   Pro Brain Natriuretic Peptide >35,000.0 (H) <300.0 pg/mL    Comment: (NOTE) Age Group        Cut-Points    Interpretation  < 50 years     450 pg/mL       NT-proBNP > 450 pg/mL indicates                                ADHF is likely              50 to 75 years  900 pg/mL      NT-proBNP > 900 pg/mL indicates          ADHF is likely  > 75 years      1800 pg/mL     NT-proBNP > 1800 pg/mL indicates          ADHF is likely                           All ages    Results between       Indeterminate. Further clinical             300 and the cut-   information is needed to determine            point for age group   if ADHF is present.                                                             Elecsys proBNP II/ Elecsys proBNP II STAT           Cut-Point                       Interpretation  300 pg/mL                    NT-proBNP <300pg/mL indicates                             ADHF is not likely  Performed at  [facility], [address], [place], [place] [address]   Troponin T, High Sensitivity     Status: Abnormal   Collection Time: 03/21/24  2:30 PM  Result Value Ref Range   Troponin T High Sensitivity 96 (H) <  19 ng/L    Comment: (NOTE) Biotin concentrations > 1000 ng/mL falsely decrease TnT results.  Serial cardiac troponin measurements are suggested.  Refer to the Links section for chest pain algorithms and additional  guidance. Performed at [facility], [address] [place], [place] [address]   Troponin T, High Sensitivity     Status: Abnormal   Collection Time: 03/21/24  6:15 PM  Result Value Ref Range   Troponin T High Sensitivity 94 (H) <19 ng/L    Comment: (NOTE) Biotin concentrations > 1000 ng/mL falsely decrease TnT results.  Serial cardiac troponin measurements are suggested.  Refer to the Links section for chest pain algorithms and additional  guidance. Performed at [facility], [address] [place], [place] [address]   Basic metabolic panel with GFR     Status: Abnormal   Collection Time: 03/22/24  3:10 AM  Result Value Ref Range   Sodium 140 135 - 145 mmol/L   Potassium 3.5 3.5 - 5.1 mmol/L   Chloride 106 98 - 111 mmol/L   CO2 21 (L) 22 - 32 mmol/L   Glucose, Bld 130 (H) 70 - 99 mg/dL    Comment: Glucose reference range applies only to samples taken after fasting for at least 8 hours.   BUN 77 (H) 8 - 23 mg/dL   Creatinine, Ser 4.92 (H) 0.61 - 1.24 mg/dL   Calcium 8.8 (L) 8.9 - 10.3 mg/dL   GFR, Estimated 11 (L) >60 mL/min    Comment: (NOTE) Calculated using the CKD-EPI Creatinine Equation (2021)    Anion gap 13 5 - 15    Comment: Performed at [facility], [address] [place], [place] [address]  CBC     Status: Abnormal   Collection Time: 03/22/24  3:10 AM  Result Value Ref Range   WBC 7.4 4.0 - 10.5 K/uL   RBC 3.29 (L) 4.22 - 5.81 MIL/uL   Hemoglobin 9.7 (L) 13.0 - 17.0 g/dL    HCT 70.1 (L) 60.9 - 52.0 %   MCV 90.6 80.0 - 100.0 fL   MCH 29.5 26.0 - 34.0 pg   MCHC 32.6 30.0 - 36.0 g/dL   RDW 85.6 88.4 - 84.4 %   Platelets 148 (L) 150 - 400 K/uL   nRBC 0.0 0.0 - 0.2 %    Comment: Performed at [facility], [address], [place], [place] [address]  Magnesium     Status: None   Collection Time: 03/22/24  3:10 AM  Result Value Ref Range   Magnesium 2.4 1.7 - 2.4 mg/dL    Comment: Performed at [facility], [address] [place], [place] [address]  Phosphorus     Status: Abnormal   Collection Time: 03/22/24  3:10 AM  Result Value Ref Range   Phosphorus 5.0 (H) 2.5 - 4.6 mg/dL    Comment: Performed at [facility], [address] [place], [place] [address]  TSH     Status: None   Collection Time: 03/22/24  3:10 AM  Result Value Ref Range   TSH 3.079 0.350 - 4.500 uIU/mL    Comment: Performed by a 3rd Generation assay with a functional sensitivity of <=0.01 uIU/mL. Performed at [facility], [address], [place], [place] [address]     DG Chest 2 View Result Date: 03/21/2024 CLINICAL DATA:  Shortness of breath EXAM: CHEST - 2 VIEW COMPARISON:  Chest x-ray performed January 07, 2022 FINDINGS: Worsening  small to moderate right-sided pleural effusion with associated consolidation. Heart mediastinum are not significantly changed. Degenerative changes are present in the imaged osseous structures. Possible trace left effusion. Mild interstitial prominence. IMPRESSION: 1. Worsening small to moderate right-sided pleural effusion with associated consolidation. Electronically Signed   By: Maude Naegeli M.D.   On: 03/21/2024 17:01    Assessment/PlanWilliam Beltran is an 83 y.o. male with HTN, atrial fibrillation, DM, CKD 5, cHFpEF, HL, anemia currently admitted for volume overload and nephrology is consulted for comanagement of volume and CKD.  **Acute on chronic combined HFpEF:  in setting of ^ dietary Na - 5lbs wt gain.  Responded well to lasix  80  IV yesterday, will increase dose to 80 IV BID for now - looks like 1-2 more days of diuresis will be enough.  If not improving much with diuresis may need to look into thoracentesis too w effusion.   CHF tx limited by GFR.   Reiterated low na diet.  Strict I/Os, daily weights here, fluid and sodium restricted diet here.   **CKD 5:  F/b [facility] closely, has LUE AVF in place which would be useable.  He currently has no uremic symptoms and is responding to diuretics - no need for HD currently.  Had appt [facility] today, will need to reschedule at d/c.    **Anemia of CKD:  Hb 10 > 9s.  Check iron indices.  Hasn't required ESA to date.   **HTN:  BP in 140s, follow with diuresis.   **DM: diet controlled.   Will follow, reach out with concerns.   Manuelita DELENA Barters 03/22/2024, 11:04 AM

## 2024-03-22 NOTE — Plan of Care (Signed)

## 2024-03-22 NOTE — TOC CM/SW Note (Signed)
 Transition of Care [facility]) - Inpatient Brief Assessment   Patient Details  Name: Jason Beltran MRN: [id number] Date of Birth: March 05, 1941  Transition of Care [facility]) CM/SW Contact:    Waddell Barnie Rama, RN Phone Number: 03/22/2024, 11:51 AM   Clinical Narrative: From home with spouse, has PCP and insurance on file, states has no HH services in place at this time , but does have hired help, or DME at home.  States family member will transport them home at [employer] and family is support system, states gets medications from [facility].  Pta self ambulatory.  Patient states he is the caregiver for his wife.      Transition of Care Asessment: Insurance and Status: Insurance coverage has been reviewed Patient has primary care physician: Yes Home environment has been reviewed: home with wife Prior level of function:: indep Prior/Current Home Services: No current home services Social Drivers of Health Review: SDOH reviewed no interventions necessary Readmission risk has been reviewed: Yes Transition of care needs: no transition of care needs at this time

## 2024-03-22 NOTE — Progress Notes (Signed)
 PROGRESS NOTE    Jason Beltran  FMW:[id number] DOB: Jan 17, 1941 DOA: 03/21/2024 PCP: Benjamine Lauraine DASEN, NP   83/M with diastolic CHF, mild aortic stenosis, CKD 5, AV fistula not on dialysis yet, type 2 diabetes, chronic anemia, hypertension presented to the ED with progressive dyspnea on exertion, compliant with diuretics - In the ED BNP greater than 35K, creatinine 4.9, hemoglobin 10.4, chest x-ray with pleural effusions  Subjective: Feels a little better  Assessment and Plan:  Acute hypoxic respiratory failure, on 2 L O2  Pulmonary edema, CKD 5 Acute on chronic diastolic CHF -Recent echo 6/12 LVEF 50 to 55%, global hypokinesis, grade 2 diastolic dysfunction, severe elevation of RVSP at 76, mild to moderate MR, trace to mild AS. -Continue carvedilol , hydralazine , IV Lasix  -GDMT limited by CKD, will request nephrology eval -Repeat x-ray tomorrow   CKD stage V AV fistula in place, not on HD  -Baseline creatinine appears to be anywhere from 4-6 range -Diuretic management per above   Recent cough - Likely secondary to above, also follow-up respiratory panel  Anemia of chronic disease -Monitor, gets EPO injections   Deconditioning - PT evaluation  Diabetes type 2: Diet controlled, not current currently requiring SSI  Neuropathy: Continue gabapentin    DVT prophylaxis: Heparin  subcutaneous Code Status: Full code Family Communication: None present Disposition Plan: Home likely 48 hours if kidney function stays stable  Consultants:    Procedures:   Antimicrobials:    Objective: Vitals:   03/21/24 2349 03/22/24 0015 03/22/24 0423 03/22/24 0819  BP: (!) 157/60  (!) 149/66   Pulse: 71  73   Resp: 20  20   Temp: 97.8 F (36.6 C)  99.4 F (37.4 C)   TempSrc: Oral  Oral   SpO2:   96%   Weight:  77.7 kg  77.6 kg    Intake/Output Summary (Last 24 hours) at 03/22/2024 1127 Last data filed at 03/22/2024 0900 Gross per 24 hour  Intake 120 ml  Output 2125 ml  Net -2005 ml    Filed Weights   03/22/24 0015 03/22/24 0819  Weight: 77.7 kg 77.6 kg    Examination:  General exam: Appears calm and comfortable  + JVD Respiratory system: Decreased breath sounds at the bases Cardiovascular system: S1 & S2 heard, RRR.  Abd: nondistended, soft and nontender.Normal bowel sounds heard. Central nervous system: Alert and oriented. No focal neurological deficits. Extremities: 1 plus edema Skin: No rashes Psychiatry:  Mood & affect appropriate.     Data Reviewed:   CBC: Recent Labs  Lab 03/21/24 1430 03/22/24 0310  WBC 5.8 7.4  HGB 10.4* 9.7*  HCT 32.4* 29.8*  MCV 92.0 90.6  PLT 150 148*   Basic Metabolic Panel: Recent Labs  Lab 03/21/24 1430 03/22/24 0310  NA 139 140  K 4.4 3.5  CL 101 106  CO2 23 21*  GLUCOSE 106* 130*  BUN 73* 77*  CREATININE 4.97* 5.07*  CALCIUM 9.5 8.8*  MG  --  2.4  PHOS  --  5.0*   GFR: Estimated Creatinine Clearance: 10.3 mL/min (A) (by C-G formula based on SCr of 5.07 mg/dL (H)). Liver Function Tests: No results for input(s): AST, ALT, ALKPHOS, BILITOT, PROT, ALBUMIN in the last 168 hours. No results for input(s): LIPASE, AMYLASE in the last 168 hours. No results for input(s): AMMONIA in the last 168 hours. Coagulation Profile: No results for input(s): INR, PROTIME in the last 168 hours. Cardiac Enzymes: No results for input(s): CKTOTAL, CKMB, CKMBINDEX, TROPONINI in the  last 168 hours. BNP (last 3 results) Recent Labs    03/21/24 1430  PROBNP >35,000.0*   HbA1C: No results for input(s): HGBA1C in the last 72 hours. CBG: No results for input(s): GLUCAP in the last 168 hours. Lipid Profile: No results for input(s): CHOL, HDL, LDLCALC, TRIG, CHOLHDL, LDLDIRECT in the last 72 hours. Thyroid  Function Tests: Recent Labs    03/22/24 0310  TSH 3.079   Anemia Panel: No results for input(s): VITAMINB12, FOLATE, FERRITIN, TIBC, IRON, RETICCTPCT in the  last 72 hours. Urine analysis: No results found for: COLORURINE, APPEARANCEUR, LABSPEC, PHURINE, GLUCOSEU, HGBUR, BILIRUBINUR, KETONESUR, PROTEINUR, UROBILINOGEN, NITRITE, LEUKOCYTESUR Sepsis Labs: @LABRCNTIP (procalcitonin:4,lacticidven:4)  )No results found for this or any previous visit (from the past 240 hours).   Radiology Studies: DG Chest 2 View Result Date: 03/21/2024 CLINICAL DATA:  Shortness of breath EXAM: CHEST - 2 VIEW COMPARISON:  Chest x-ray performed January 07, 2022 FINDINGS: Worsening small to moderate right-sided pleural effusion with associated consolidation. Heart mediastinum are not significantly changed. Degenerative changes are present in the imaged osseous structures. Possible trace left effusion. Mild interstitial prominence. IMPRESSION: 1. Worsening small to moderate right-sided pleural effusion with associated consolidation. Electronically Signed   By: Maude Naegeli M.D.   On: 03/21/2024 17:01     Scheduled Meds:  aspirin  EC  81 mg Oral QPM   carvedilol   6.25 mg Oral BID WC   febuxostat   20 mg Oral Daily   furosemide   40 mg Intravenous BID   gabapentin   100 mg Oral TID   heparin   5,000 Units Subcutaneous Q8H   hydrALAZINE   50 mg Oral BID   simvastatin   20 mg Oral QPM   sodium bicarbonate   650 mg Oral BID   sodium chloride  flush  3 mL Intravenous Q12H   Continuous Infusions:   LOS: 1 day    Time spent:    Sigurd Pac, MD [facility]   03/22/2024, 11:27 AM

## 2024-03-23 DIAGNOSIS — E1169 Type 2 diabetes mellitus with other specified complication: Secondary | ICD-10-CM

## 2024-03-23 DIAGNOSIS — I10 Essential (primary) hypertension: Secondary | ICD-10-CM | POA: Diagnosis not present

## 2024-03-23 DIAGNOSIS — N185 Chronic kidney disease, stage 5: Secondary | ICD-10-CM

## 2024-03-23 DIAGNOSIS — E785 Hyperlipidemia, unspecified: Secondary | ICD-10-CM

## 2024-03-23 DIAGNOSIS — I5033 Acute on chronic diastolic (congestive) heart failure: Secondary | ICD-10-CM | POA: Diagnosis not present

## 2024-03-23 LAB — IRON AND TIBC
Iron: 36 ug/dL — ABNORMAL LOW (ref 45–182)
Saturation Ratios: 13 % — ABNORMAL LOW (ref 17.9–39.5)
TIBC: 286 ug/dL (ref 250–450)
UIBC: 250 ug/dL

## 2024-03-23 LAB — BASIC METABOLIC PANEL WITH GFR
Anion gap: 13 (ref 5–15)
BUN: 76 mg/dL — ABNORMAL HIGH (ref 8–23)
CO2: 23 mmol/L (ref 22–32)
Calcium: 8.6 mg/dL — ABNORMAL LOW (ref 8.9–10.3)
Chloride: 105 mmol/L (ref 98–111)
Creatinine, Ser: 5.14 mg/dL — ABNORMAL HIGH (ref 0.61–1.24)
GFR, Estimated: 10 mL/min — ABNORMAL LOW (ref >60)
Glucose, Bld: 120 mg/dL — ABNORMAL HIGH (ref 70–99)
Potassium: 3.8 mmol/L (ref 3.5–5.1)
Sodium: 141 mmol/L (ref 135–145)

## 2024-03-23 LAB — FERRITIN: Ferritin: 503 ng/mL — ABNORMAL HIGH (ref 24–336)

## 2024-03-23 MED ORDER — FERROUS SULFATE 325 (65 FE) MG PO TABS
325.0000 mg | ORAL_TABLET | Freq: Every day | ORAL | Status: DC
  Administered 2024-03-24: 325 mg via ORAL
  Filled 2024-03-23 (×3): qty 1

## 2024-03-23 NOTE — Assessment & Plan Note (Addendum)
 Renal function stable, with serum cr at 5.14 with K at 3,8 and serum bicarbonate at 23  Na 141   Plan to continue diuresis with IV furosemide .  Follow up renal function and electrolytes in am.   Anemia of chronic disease with iron deficiency, Serum iron 36, TIBC 286, transferrin saturation 13 and ferritin 503.   Continue oral sodium bicarbonate .

## 2024-03-23 NOTE — Progress Notes (Signed)
  Progress Note   Patient: Jason Beltran FMW:[id number] DOB: Nov 06, 1940 DOA: 03/21/2024     2 DOS: the patient was seen and examined on 03/23/2024   Brief hospital course: 83/M with diastolic CHF, mild aortic stenosis, CKD 5, AV fistula not on dialysis yet, type 2 diabetes, chronic anemia, hypertension presented to the ED with progressive dyspnea on exertion, compliant with diuretics - In the ED BNP greater than 35K, creatinine 4.9, hemoglobin 10.4, chest x-ray with pleural effusions  Assessment and Plan: * Acute on chronic diastolic (congestive) heart failure (HCC) Echocardiogram with preserved LV systolic function with EF 50 to 55%, global hypokinesis, RV systolic function preserved, LA with severe dilatation, mild to moderate mitral valve regurgitation, mild aortic stenosis, RVSP 76,3 mmHg,   Urine output 1,650 ml Systolic blood pressure 130 mmHg range.   Plan to continue diuresis with furosemide  80 mg IV bid Continue carvedilol  and hydralazine    Essential hypertension Continue blood pressure control with carvedilol  and hydralazine .   CKD (chronic kidney disease) stage 5, GFR less than 15 ml/min (HCC) Renal function stable, with serum cr at 5.14 with K at 3,8 and serum bicarbonate at 23  Na 141   Plan to continue diuresis with IV furosemide .  Follow up renal function and electrolytes in am.   Anemia of chronic disease with iron deficiency, Serum iron 36, TIBC 286, transferrin saturation 13 and ferritin 503.   Continue oral sodium bicarbonate .   Type 2 diabetes mellitus with hyperlipidemia (HCC) Continue glucose cover and monitoring with insulin  sliding scale.  Continue statin therapy/       Subjective: patient is feeling better, dyspnea and edema continue to improve, no chest pain, he has been ambulating   Physical Exam: Vitals:   03/23/24 0348 03/23/24 0755 03/23/24 0834 03/23/24 1055  BP: (!) 148/62 (!) 147/59 (!) 147/51 (!) 125/58  Pulse: 68 67 64 (!) 57  Resp: 15 (!)  24 18 18   Temp: 98.8 F (37.1 C) 97.8 F (36.6 C)  97.8 F (36.6 C)  TempSrc: Oral Oral  Oral  SpO2: 92% 96% 94% 96%  Weight: 74.9 kg      Neurology awake and alert ENT with mild pallor Cardiovascular with S1 and S2 present and regular with no gallops or rubs, no murmurs Respiratory with no rales or wheezing, no rhonchi  Abdomen with no distention  No lower extremity edema   Data Reviewed:    Family Communication: I spoke with patient's wife and daughter in law at the bedside, we talked in detail about patient's condition, plan of care and prognosis and all questions were addressed.   Disposition: Status is: Inpatient Remains inpatient appropriate because: recovering renal failure   Planned Discharge Destination: Home     Author: Elidia Toribio Furnace, MD 03/23/2024 2:01 PM  For on call review [email or web address].

## 2024-03-23 NOTE — Assessment & Plan Note (Signed)
 Echocardiogram with preserved LV systolic function with EF 50 to 55%, global hypokinesis, RV systolic function preserved, LA with severe dilatation, mild to moderate mitral valve regurgitation, mild aortic stenosis, RVSP 76,3 mmHg,   Urine output 1,650 ml Systolic blood pressure 130 mmHg range.   Plan to continue diuresis with furosemide  80 mg IV bid Continue carvedilol  and hydralazine 

## 2024-03-23 NOTE — Progress Notes (Signed)
 [facility] Progress Note   Subjective:   Feels well - has ambulated x 3 in hall with improved DOE. I/Os yest 0.5 / 1.65.  Labs stable this AM.   Objective Vitals:   03/23/24 0015 03/23/24 0348 03/23/24 0755 03/23/24 0834  BP: (!) 123/57 (!) 148/62 (!) 147/59 (!) 147/51  Pulse: 63 68 67 64  Resp: 18 15 (!) 24 18  Temp: 98.7 F (37.1 C) 98.8 F (37.1 C) 97.8 F (36.6 C)   TempSrc: Oral Oral Oral   SpO2: 92% 92% 96% 94%  Weight:  74.9 kg     Physical Exam Gen:  well appearing Eyes: anicteric, EOMI ENT: MMM CV:  RRR, no rub Abd: soft, mild distention Lungs: normal WOB on RA, dec BS R base, rales in L base but improving GU: no foley Extr:  trace LE edema, LUE BC AVF +t/b, mature  Neuro: sharp, conversant  Additional Objective Labs: Basic Metabolic Panel: Recent Labs  Lab 03/21/24 1430 03/22/24 0310 03/23/24 0251  NA 139 140 141  K 4.4 3.5 3.8  CL 101 106 105  CO2 23 21* 23  GLUCOSE 106* 130* 120*  BUN 73* 77* 76*  CREATININE 4.97* 5.07* 5.14*  CALCIUM 9.5 8.8* 8.6*  PHOS  --  5.0*  --    Liver Function Tests: No results for input(s): AST, ALT, ALKPHOS, BILITOT, PROT, ALBUMIN in the last 168 hours. No results for input(s): LIPASE, AMYLASE in the last 168 hours. CBC: Recent Labs  Lab 03/21/24 1430 03/22/24 0310  WBC 5.8 7.4  HGB 10.4* 9.7*  HCT 32.4* 29.8*  MCV 92.0 90.6  PLT 150 148*   Blood Culture No results found for: SDES, SPECREQUEST, CULT, REPTSTATUS  Cardiac Enzymes: No results for input(s): CKTOTAL, CKMB, CKMBINDEX, TROPONINI in the last 168 hours. CBG: No results for input(s): GLUCAP in the last 168 hours. Iron Studies: No results for input(s): IRON, TIBC, TRANSFERRIN, FERRITIN in the last 72 hours. @lablastinr3 @ Studies/Results: DG Chest 2 View Result Date: 03/21/2024 CLINICAL DATA:  Shortness of breath EXAM: CHEST - 2 VIEW COMPARISON:  Chest x-ray performed January 07, 2022 FINDINGS:  Worsening small to moderate right-sided pleural effusion with associated consolidation. Heart mediastinum are not significantly changed. Degenerative changes are present in the imaged osseous structures. Possible trace left effusion. Mild interstitial prominence. IMPRESSION: 1. Worsening small to moderate right-sided pleural effusion with associated consolidation. Electronically Signed   By: Maude Naegeli M.D.   On: 03/21/2024 17:01   Medications:   aspirin  EC  81 mg Oral QPM   carvedilol   6.25 mg Oral BID WC   febuxostat   20 mg Oral Daily   furosemide   80 mg Intravenous BID   gabapentin   100 mg Oral TID   heparin   5,000 Units Subcutaneous Q8H   hydrALAZINE   50 mg Oral BID   simvastatin   20 mg Oral QPM   sodium bicarbonate   650 mg Oral BID   sodium chloride  flush  3 mL Intravenous Q12H    Assessment/PlanWilliam Beltran is an 83 y.o. male with HTN, atrial fibrillation, DM, CKD 5, cHFpEF, HL, anemia currently admitted for volume overload and nephrology is consulted for comanagement of volume and CKD.   **Acute on chronic combined HFpEF:  in setting of ^ dietary Na - 5lbs wt gain.  Responding to lasix  80 IV BID - maintain for 2 more doses.  CHF tx limited by GFR.   Reiterated low na diet.  Strict I/Os, daily weights here, fluid and sodium  restricted diet here.    **CKD 5:  F/b CKA closely, has LUE AVF in place which would be useable.  He currently has no uremic symptoms and is responding to diuretics - no need for HD currently.  Had appt [facility] scheduled 7/8, will need to reschedule at d/c.     **Anemia of CKD:  Hb 10 > 9s.  Check iron indices.  Hasn't required ESA to date.    **HTN:  BP in 140s, follow with diuresis.    **DM: diet controlled.    Will follow, reach out with concerns.  Likely can d/c tomorrow.  Manuelita Barters MD 03/23/2024, 10:38 AM  [facility] Pager: [phone]

## 2024-03-23 NOTE — Assessment & Plan Note (Signed)
 Continue blood pressure control with carvedilol  and hydralazine .

## 2024-03-23 NOTE — Hospital Course (Signed)
 Jason Beltran was admitted to the hospital with the working diagnosis of heart failure exacerbation, volume overload in the setting of chronic renal disease.    83/M with past medical history of heart failure, mild aortic stenosis, CKD 5, (AV fistula), type 2 diabetes, chronic anemia, hypertension who presented to the ED with progressive dyspnea on exertion. Worsening symptoms over last 48 hrs prior to admission, to the point where he was having dyspnea with minimal efforts. He called his cardiologist and he was advised to come to the ED. On his initial physical examination his blood pressure was 147/56, HR 69, RR 24 and 02 saturation 89% on room air.  Lungs with no wheezing or rhonchi, heart with S1 and S2 present and regular with no gallops or rubs, abdomen with no distention, positive lower extremity edema.   Na 139, K 4.4 Cl 101 bicarbonate 23, glucose 106 bun 73 cr 4,97  BNP > 35,000 High sensitive troponin 96 and 94  Wbc 5,8 hgb 10.4 plt 150   Chest radiograph with cardiomegaly, bilateral hilar vascular congestion, bilateral pleural effusions, moderate on the right and small on the left.   EKG 67 bpm, right axis deviation, normal intervals, qtc 494, sinus rhythm with no significant ST segment or T wave changes.  Patient was placed on IV furosemide  for diuresis.  Improvement in volume status, with stable renal function.

## 2024-03-23 NOTE — Progress Notes (Signed)
 Mobility Specialist Progress Note:   03/23/24 0930  Mobility  Activity Ambulated independently in hallway  Level of Assistance Modified independent, requires aide device or extra time  Assistive Device None  Distance Ambulated (ft) 250 ft  Activity Response Tolerated well  Mobility Referral Yes  Mobility visit 1 Mobility  Mobility Specialist Start Time (ACUTE ONLY) 0930  Mobility Specialist Stop Time (ACUTE ONLY) 0945  Mobility Specialist Time Calculation (min) (ACUTE ONLY) 15 min   Pt eager for mobility session. Required no physical assistance to ambulate. No c/o throughout. VSS on RA. Pt back in bed with all needs met.   Therisa Rana Mobility Specialist Please contact via [employer] or  Rehab office at [phone]

## 2024-03-23 NOTE — Assessment & Plan Note (Signed)
 Glucose remained stable during his hospitalization.  Fasting glucose on the day of discharge 112 mg/dl.   Continue statin therapy.

## 2024-03-23 NOTE — Plan of Care (Signed)

## 2024-03-24 ENCOUNTER — Other Ambulatory Visit (HOSPITAL_COMMUNITY): Payer: Self-pay

## 2024-03-24 DIAGNOSIS — N185 Chronic kidney disease, stage 5: Secondary | ICD-10-CM | POA: Diagnosis not present

## 2024-03-24 DIAGNOSIS — I10 Essential (primary) hypertension: Secondary | ICD-10-CM | POA: Diagnosis not present

## 2024-03-24 DIAGNOSIS — I5033 Acute on chronic diastolic (congestive) heart failure: Secondary | ICD-10-CM | POA: Diagnosis not present

## 2024-03-24 DIAGNOSIS — E1169 Type 2 diabetes mellitus with other specified complication: Secondary | ICD-10-CM | POA: Diagnosis not present

## 2024-03-24 LAB — CBC
HCT: 30.3 % — ABNORMAL LOW (ref 39.0–52.0)
Hemoglobin: 9.9 g/dL — ABNORMAL LOW (ref 13.0–17.0)
MCH: 29.1 pg (ref 26.0–34.0)
MCHC: 32.7 g/dL (ref 30.0–36.0)
MCV: 89.1 fL (ref 80.0–100.0)
Platelets: 144 K/uL — ABNORMAL LOW (ref 150–400)
RBC: 3.4 MIL/uL — ABNORMAL LOW (ref 4.22–5.81)
RDW: 14.3 % (ref 11.5–15.5)
WBC: 5.5 K/uL (ref 4.0–10.5)
nRBC: 0 % (ref 0.0–0.2)

## 2024-03-24 LAB — BASIC METABOLIC PANEL WITH GFR
Anion gap: 14 (ref 5–15)
BUN: 76 mg/dL — ABNORMAL HIGH (ref 8–23)
CO2: 23 mmol/L (ref 22–32)
Calcium: 8.7 mg/dL — ABNORMAL LOW (ref 8.9–10.3)
Chloride: 102 mmol/L (ref 98–111)
Creatinine, Ser: 5.13 mg/dL — ABNORMAL HIGH (ref 0.61–1.24)
GFR, Estimated: 10 mL/min — ABNORMAL LOW (ref >60)
Glucose, Bld: 112 mg/dL — ABNORMAL HIGH (ref 70–99)
Potassium: 3.7 mmol/L (ref 3.5–5.1)
Sodium: 139 mmol/L (ref 135–145)

## 2024-03-24 MED ORDER — FERROUS SULFATE 325 (65 FE) MG PO TABS
325.0000 mg | ORAL_TABLET | Freq: Every day | ORAL | 0 refills | Status: DC
  Filled 2024-03-24: qty 30, 30d supply, fill #0

## 2024-03-24 MED ORDER — TORSEMIDE 20 MG PO TABS
40.0000 mg | ORAL_TABLET | Freq: Every day | ORAL | 0 refills | Status: DC
  Filled 2024-03-24: qty 120, 60d supply, fill #0

## 2024-03-24 MED ORDER — TORSEMIDE 20 MG PO TABS: 40.0000 mg | ORAL_TABLET | Freq: Every day | ORAL | Status: DC

## 2024-03-24 NOTE — TOC Transition Note (Signed)
 Transition of Care [facility]) - Discharge Note   Patient Details  Name: Gualberto Wahlen MRN: [id number] Date of Birth: [birth year]  [facility]) CM/SW Contact:  Waddell Barnie Rama, RN Phone Number: 03/24/2024, 10:38 AM   Clinical Narrative:    For possible dc today, he has no needs.         Patient Goals and CMS Choice            Discharge Placement                       Discharge Plan and Services Additional resources added to the After Visit Summary for                                       Social Drivers of Health (SDOH) Interventions SDOH Screenings   Food Insecurity: No Food Insecurity (03/21/2024)  Housing: Low Risk  (03/21/2024)  Transportation Needs: No Transportation Needs (03/21/2024)  Utilities: Not At Risk (03/21/2024)  Social Connections: Socially Integrated (03/22/2024)  Tobacco Use: Low Risk  (03/21/2024)     Readmission Risk Interventions    03/22/2024   11:50 AM  Readmission Risk Prevention Plan  Transportation Screening Complete  PCP or Specialist Appt within 5-7 Days Complete  Home Care Screening Complete  Medication Review (RN CM) Complete

## 2024-03-24 NOTE — Plan of Care (Signed)

## 2024-03-24 NOTE — Care Management Important Message (Signed)
 Important Message  Patient Details  Name: Jason Beltran MRN: [id number] Date of Birth: [birth year]   Important Message Given:  Yes - Medicare IM     Jon Cruel 03/24/2024, 3:47 PM

## 2024-03-24 NOTE — Progress Notes (Signed)
 [facility] Progress Note   Subjective:   Feels well - has ambulated in hall with improved DOE. I/Os yest 0.5 / 2.8.  Labs stable this AM.  Feels good, no new issues  Objective Vitals:   03/23/24 2014 03/23/24 2352 03/24/24 0401 03/24/24 0817  BP: (!) 153/62 (!) 129/54 (!) 149/65 (!) 132/46  Pulse: 63 60 64 71  Resp: 19 16 19 18   Temp: 97.9 F (36.6 C) 98 F (36.7 C) 97.8 F (36.6 C) 98.4 F (36.9 C)  TempSrc: Oral Oral Oral Oral  SpO2: 98% 91% 92% 90%  Weight:   73.7 kg    Physical Exam Gen:  well appearing Eyes: anicteric, EOMI ENT: MMM CV:  RRR, no rub Abd: soft, mild distention Lungs: normal WOB on RA, clear now GU: no foley Extr:  no LE edema, LUE BC AVF +t/b, mature  Neuro: sharp, conversant  Additional Objective Labs: Basic Metabolic Panel: Recent Labs  Lab 03/22/24 0310 03/23/24 0251 03/24/24 0259  NA 140 141 139  K 3.5 3.8 3.7  CL 106 105 102  CO2 21* 23 23  GLUCOSE 130* 120* 112*  BUN 77* 76* 76*  CREATININE 5.07* 5.14* 5.13*  CALCIUM 8.8* 8.6* 8.7*  PHOS 5.0*  --   --    Liver Function Tests: No results for input(s): AST, ALT, ALKPHOS, BILITOT, PROT, ALBUMIN in the last 168 hours. No results for input(s): LIPASE, AMYLASE in the last 168 hours. CBC: Recent Labs  Lab 03/21/24 1430 03/22/24 0310 03/24/24 0259  WBC 5.8 7.4 5.5  HGB 10.4* 9.7* 9.9*  HCT 32.4* 29.8* 30.3*  MCV 92.0 90.6 89.1  PLT 150 148* 144*   Blood Culture No results found for: SDES, SPECREQUEST, CULT, REPTSTATUS  Cardiac Enzymes: No results for input(s): CKTOTAL, CKMB, CKMBINDEX, TROPONINI in the last 168 hours. CBG: No results for input(s): GLUCAP in the last 168 hours. Iron Studies:  Recent Labs    03/23/24 1244  IRON 36*  TIBC 286  FERRITIN 503*   @lablastinr3 @ Studies/Results: No results found.  Medications:   aspirin  EC  81 mg Oral QPM   carvedilol   6.25 mg Oral BID WC   febuxostat   20 mg Oral Daily    ferrous sulfate   325 mg Oral Q breakfast   furosemide   80 mg Intravenous BID   gabapentin   100 mg Oral TID   heparin   5,000 Units Subcutaneous Q8H   hydrALAZINE   50 mg Oral BID   simvastatin   20 mg Oral QPM   sodium bicarbonate   650 mg Oral BID   sodium chloride  flush  3 mL Intravenous Q12H    Assessment/PlanWilliam Beltran is an 83 y.o. male with HTN, atrial fibrillation, DM, CKD 5, cHFpEF, HL, anemia currently admitted for volume overload and nephrology is consulted for comanagement of volume and CKD.   **Acute on chronic combined HFpEF:  in setting of ^ dietary Na - 5lbs wt gain.  Responded to lasix  80 IV BID.  CHF tx limited by GFR.   Reiterated low na diet.  Strict I/Os, daily weights here, fluid and sodium restricted diet here.  No euvolemic.    **CKD 5:  F/b [facility] closely, has LUE AVF in place which would be useable.  He currently has no uremic symptoms and is responding to diuretics - no need for HD currently.  Will arrange office f/u 1-2 weeks - he asked staff to call him to schedule due to multiple conflicts, sent message and they will call him.    **  Anemia of CKD:  Hb 10s.  Stable   **HTN:  BP improved w diuresis.    **DM: diet controlled.    OK for d/c. D/c with torsemide  40 daily with low na diet, 40oz fluid restriction and instructions to take torsemide  60 if 2lbs wt gain.  He was salting watermelon PTA which I think was the main issue.   Jason Barters MD 03/24/2024, 9:41 AM  [facility] Pager: [phone]

## 2024-03-24 NOTE — Progress Notes (Signed)
 Reviewed AVS, patient expressed understanding of medications, MD follow up reviewed.   Removed IV, Site clean, dry and intact.  Patient states all belongings brought to the hospital at time of admission are accounted for and packed to take home.  Picked up medications from [facility] pharmacy. Pt transported to entrance A where family member was waiting in vehicle to transport home.

## 2024-03-24 NOTE — Progress Notes (Signed)
 Mobility Specialist Progress Note:    03/24/24 1038  Mobility  Activity Ambulated with assistance in hallway  Level of Assistance Independent after set-up  Assistive Device None  Distance Ambulated (ft) 500 ft  Activity Response Tolerated well  Mobility Referral Yes  Mobility visit 1 Mobility  Mobility Specialist Start Time (ACUTE ONLY) [id number]  Mobility Specialist Stop Time (ACUTE ONLY) 0935  Mobility Specialist Time Calculation (min) (ACUTE ONLY) 19 min   Received pt EOB and agreeable to mobility. No physical assistance needed. No c/o throughout. Returned pt to room without fault. Left EOB. Personal belongings and call light within reach. All needs met.   Lavanda Pollack Mobility Specialist  Please contact via [employer] or  Rehab Office [phone]

## 2024-03-24 NOTE — Discharge Summary (Signed)
 Physician Discharge Summary   Patient: Jason Beltran MRN: [id number] DOB: 1940/10/19  Admit date:     03/21/2024  Discharge date: 03/24/24  Discharge Physician: Elidia Sieving Osiel Stick   PCP: Benjamine Lauraine DASEN, NP   Recommendations at discharge:   Patient will continue diuresis with torsemide  40 mg daily and instructions to take 60 mg daily in case of weight gain 2 to 3 lbs in 24 hrs or 5 lbs in 7 days.  Limit fluid intake to less than 40 oz per day and limit salt intake  Follow up renal function and electrolytes in 7 days as outpatient Follow up with Lauraine Benjamine T NP in 7 to 10 days Follow up with Nephrology as scheduled.   Discharge Diagnoses: Principal Problem:   Acute on chronic diastolic (congestive) heart failure (HCC) Active Problems:   Essential hypertension   CKD (chronic kidney disease) stage 5, GFR less than 15 ml/min (HCC)   Type 2 diabetes mellitus with hyperlipidemia (HCC)  Resolved Problems:   * No resolved hospital problems. [facility] Course: Mr. Hilligoss was admitted to the hospital with the working diagnosis of heart failure exacerbation, volume overload in the setting of chronic renal disease.    83/M with past medical history of heart failure, mild aortic stenosis, CKD 5, (AV fistula), type 2 diabetes, chronic anemia, hypertension who presented to the ED with progressive dyspnea on exertion. Worsening symptoms over last 48 hrs prior to admission, to the point where he was having dyspnea with minimal efforts. He called his cardiologist and he was advised to come to the ED. On his initial physical examination his blood pressure was 147/56, HR 69, RR 24 and 02 saturation 89% on room air.  Lungs with no wheezing or rhonchi, heart with S1 and S2 present and regular with no gallops or rubs, abdomen with no distention, positive lower extremity edema.   Na 139, K 4.4 Cl 101 bicarbonate 23, glucose 106 bun 73 cr 4,97  BNP > 35,000 High sensitive troponin 96 and 94  Wbc 5,8 hgb  10.4 plt 150   Chest radiograph with cardiomegaly, bilateral hilar vascular congestion, bilateral pleural effusions, moderate on the right and small on the left.   EKG 67 bpm, right axis deviation, normal intervals, qtc 494, sinus rhythm with no significant ST segment or T wave changes.  Patient was placed on IV furosemide  for diuresis.  Improvement in volume status, with stable renal function.    Assessment and Plan: * Acute on chronic diastolic (congestive) heart failure (HCC) Echocardiogram with preserved LV systolic function with EF 50 to 55%, global hypokinesis, RV systolic function preserved, LA with severe dilatation, mild to moderate mitral valve regurgitation, mild aortic stenosis, RVSP 76,3 mmHg,   Acute on chronic core pulmonale.   Patient was placed on IV furosemide  for diuresis, negative fluid balance was achieved, - 4,874 ml, with significant improvement in his symptoms.   Continue carvedilol  and afterload reduction with hydralazine . Continue diuresis with torsemide  40 mg daily, and take 60 mg in case of volume overload.  Limited medical therapy due to low GFR.   Essential hypertension Continue blood pressure control with carvedilol  and hydralazine .   CKD (chronic kidney disease) stage 5, GFR less than 15 ml/min (HCC) Patient has AV fistula in place.   Improved volume status, at the time of his discharge his serum cr is 5.13 with K at 3,7 and serum bicarbonate at 23.  Na 139.   Continue diuresis with torsemide , limit fluid and  salt intake as outpatient.   Anemia of chronic disease with iron deficiency, Serum iron 36, TIBC 286, transferrin saturation 13 and ferritin 503.  Continue with oral iron supplementation   Continue oral sodium bicarbonate .   Type 2 diabetes mellitus with hyperlipidemia (HCC) Glucose remained stable during his hospitalization.  Fasting glucose on the day of discharge 112 mg/dl.   Continue statin therapy.         Consultants:  nephrology  Procedures performed: none   Disposition: Home Diet recommendation:  Cardiac diet DISCHARGE MEDICATION: Allergies as of 03/24/2024       Reactions   Actos [pioglitazone] Other (See Comments)   Contraindicated by cardiomyopathy   Lisinopril Other (See Comments)   Antihypertensives and ARB's discouraged by Nephrology   Motrin [ibuprofen]    Prefers not to take   Trilipix [choline Fenofibrate] Other (See Comments)   Contributed to elevated LFT's        Medication List     TAKE these medications    aspirin  EC 81 MG tablet Take 81 mg by mouth at bedtime.   carvedilol  6.25 MG tablet Commonly known as: COREG  Take 6.25 mg by mouth 2 (two) times daily with a meal.   CoQ10 200 MG Caps Take 200 mg by mouth daily.   febuxostat  40 MG tablet Commonly known as: ULORIC  Take 20 mg by mouth daily.   ferrous sulfate  325 (65 FE) MG tablet Take 1 tablet (325 mg total) by mouth daily with breakfast. Start taking on: March 25, 2024   gabapentin  100 MG capsule Commonly known as: NEURONTIN  Take 100 mg by mouth 2 (two) times daily.   hydrALAZINE  50 MG tablet Commonly known as: APRESOLINE  Take 50 mg by mouth in the morning and at bedtime.   ipratropium 0.06 % nasal spray Commonly known as: ATROVENT Place 2 sprays into both nostrils daily.   multivitamin tablet Take 1 tablet by mouth daily.   Resveratrol Plus 100-100 MG Tabs Generic drug: Resveratrol-Quercetin Take 1 capsule by mouth daily.   simvastatin  20 MG tablet Commonly known as: ZOCOR  Take 20 mg by mouth at bedtime.   sodium bicarbonate  650 MG tablet Take 650 mg by mouth 2 (two) times daily.   torsemide  20 MG tablet Commonly known as: DEMADEX  Take 2 tablets (40 mg total) by mouth daily. In case of weight gain 2 to 3 lbs in 24 hrs or 5 lbs in 7 days, take 3 tablets daily until weight back to baseline. Start taking on: March 25, 2024 What changed:  how much to take when to take this additional  instructions   Vitamin D3 50 MCG (2000 UT) Tabs Take 2,000 Units by mouth daily.        Discharge Exam: Filed Weights   03/22/24 0819 03/23/24 0348 03/24/24 0401  Weight: 77.6 kg 74.9 kg 73.7 kg   BP (!) 132/46 (BP Location: Right Arm)   Pulse 71   Temp 98.4 F (36.9 C) (Oral)   Resp 18   Wt 73.7 kg   SpO2 90%   BMI 24.69 kg/m   Patient is feeling better, dyspnea, orthopnea and lower extremity edema have resolved, no nausea or vomiting.   Neurology awake and alert ENT with mild pallor Cardiovascular with S1 and S2 present and regular, with no gallops, or rubs, positive systolic murmur at the apex. No JVD Respiratory with no rales or wheezing, no rhonchi  Abdomen with no distention, soft and non tender No lower extremity edema   Condition at discharge:  stable  The results of significant diagnostics from this hospitalization (including imaging, microbiology, ancillary and laboratory) are listed below for reference.   Imaging Studies: DG Chest 2 View Result Date: 03/21/2024 CLINICAL DATA:  Shortness of breath EXAM: CHEST - 2 VIEW COMPARISON:  Chest x-ray performed January 07, 2022 FINDINGS: Worsening small to moderate right-sided pleural effusion with associated consolidation. Heart mediastinum are not significantly changed. Degenerative changes are present in the imaged osseous structures. Possible trace left effusion. Mild interstitial prominence. IMPRESSION: 1. Worsening small to moderate right-sided pleural effusion with associated consolidation. Electronically Signed   By: Maude Naegeli M.D.   On: 03/21/2024 17:01   ECHOCARDIOGRAM COMPLETE Result Date: 02/27/2024    ECHOCARDIOGRAM REPORT   Patient Name:   PATRICIO POPWELL Date of Exam: 02/25/2024 Medical Rec #:  [id number]    Height:       68.0 in Accession #:    [id number]   Weight:       172.6 lb Date of Birth:  1941-05-20    BSA:          1.920 m Patient Age:    83 years     BP:           137/67 mmHg Patient Gender: M             HR:           63 bpm. Exam Location:  [place] Procedure: 2D Echo, Cardiac Doppler, Color Doppler and Strain Analysis (Both            Spectral and Color Flow Doppler were utilized during procedure). Indications:    Chronic combined systolic and diastolic congestive heart failure                 (HCC) [I50.42 (ICD-10-CM)]; Nonrheumatic aortic valve stenosis                 [I35.0 (ICD-10-CM)]  History:        Patient has prior history of Echocardiogram examinations, most                 recent 01/08/2022. CHF, Abnormal ECG, Aortic Valve Disease and                 Mitral Valve Disease, Signs/Symptoms:Shortness of Breath; Risk                 Factors:Diabetes and Hypertension.  Sonographer:    Charlie Jointer RDCS Referring Phys: [id number] ALEAN SAUNDERS MADIREDDY IMPRESSIONS  1. Left ventricular ejection fraction, by estimation, is 50 to 55%. The left ventricle has low normal function. The left ventricle demonstrates global hypokinesis. Left ventricular diastolic parameters are consistent with Grade II diastolic dysfunction (pseudonormalization). The average left ventricular global longitudinal strain is -9.1 %. The global longitudinal strain is abnormal.  2. Right ventricular systolic function is normal. The right ventricular size is normal. There is severely elevated pulmonary artery systolic pressure.  3. Left atrial size was severely dilated.  4. The mitral valve is degenerative. Mild to moderate mitral valve regurgitation. No evidence of mitral stenosis.  5. The aortic valve is normal in structure. There is mild calcification of the aortic valve. There is moderate thickening of the aortic valve. Aortic valve regurgitation is not visualized. Trace to mild aortic stenosis is present. Aortic valve area, by VTI measures 2.13 cm. Aortic valve mean gradient measures 10.0 mmHg. Aortic valve Vmax measures 2.07 m/s.  6. The inferior vena cava is normal in size with greater than  50% respiratory variability, suggesting right  atrial pressure of 3 mmHg. Comparison(s): In comparison prior TTE report from 07/24/2021 noted LVEF 55-60%, grade 2 DD, mild MR, mild AS, RVSP 39mm Hg. FINDINGS  Left Ventricle: Left ventricular ejection fraction, by estimation, is 50 to 55%. The left ventricle has low normal function. The left ventricle demonstrates global hypokinesis. The average left ventricular global longitudinal strain is -9.1 %. Strain was performed and the global longitudinal strain is abnormal. The left ventricular internal cavity size was normal in size. There is no left ventricular hypertrophy. Left ventricular diastolic parameters are consistent with Grade II diastolic dysfunction  (pseudonormalization). Right Ventricle: The right ventricular size is normal. Right vetricular wall thickness was not well visualized. Right ventricular systolic function is normal. There is severely elevated pulmonary artery systolic pressure. The tricuspid regurgitant velocity is 4.28 m/s, and with an assumed right atrial pressure of 3 mmHg, the estimated right ventricular systolic pressure is 76.3 mmHg. Left Atrium: Left atrial size was severely dilated. Right Atrium: Right atrial size was normal in size. Pericardium: There is no evidence of pericardial effusion. Mitral Valve: The mitral valve is degenerative in appearance. Mild mitral annular calcification. Mild to moderate mitral valve regurgitation. No evidence of mitral valve stenosis. Tricuspid Valve: The tricuspid valve is not well visualized. Tricuspid valve regurgitation is mild . No evidence of tricuspid stenosis. Aortic Valve: The aortic valve is normal in structure. There is mild calcification of the aortic valve. There is moderate thickening of the aortic valve. Aortic valve regurgitation is not visualized. No aortic stenosis is present. Aortic valve mean gradient measures 10.0 mmHg. Aortic valve peak gradient measures 17.1 mmHg. Aortic valve area, by VTI measures 2.13 cm. Pulmonic Valve: The  pulmonic valve was not well visualized. Pulmonic valve regurgitation is mild. No evidence of pulmonic stenosis. Aorta: The aortic root and ascending aorta are structurally normal, with no evidence of dilitation. Venous: The inferior vena cava is normal in size with greater than 50% respiratory variability, suggesting right atrial pressure of 3 mmHg. IAS/Shunts: The interatrial septum was not well visualized.  LEFT VENTRICLE PLAX 2D LVIDd:         6.10 cm   Diastology LVIDs:         4.90 cm   LV e' medial:    5.60 cm/s LV PW:         1.00 cm   LV E/e' medial:  20.4 LV IVS:        0.90 cm   LV e' lateral:   6.80 cm/s LVOT diam:     2.40 cm   LV E/e' lateral: 16.8 LV SV:         99 LV SV Index:   51        2D Longitudinal Strain LVOT Area:     4.52 cm  2D Strain GLS Avg:     -9.1 %  RIGHT VENTRICLE             IVC RV Basal diam:  3.90 cm     IVC diam: 1.90 cm RV Mid diam:    2.90 cm RV S prime:     12.00 cm/s TAPSE (M-mode): 3.5 cm LEFT ATRIUM              Index        RIGHT ATRIUM           Index LA diam:        4.20 cm  2.19 cm/m   RA  Area:     22.40 cm LA Vol (A2C):   76.7 ml  39.95 ml/m  RA Volume:   72.10 ml  37.56 ml/m LA Vol (A4C):   120.0 ml 62.51 ml/m LA Biplane Vol: 98.2 ml  51.15 ml/m  AORTIC VALVE AV Area (Vmax):    2.25 cm AV Area (Vmean):   2.07 cm AV Area (VTI):     2.13 cm AV Vmax:           207.00 cm/s AV Vmean:          143.000 cm/s AV VTI:            0.462 m AV Peak Grad:      17.1 mmHg AV Mean Grad:      10.0 mmHg LVOT Vmax:         103.00 cm/s LVOT Vmean:        65.500 cm/s LVOT VTI:          0.218 m LVOT/AV VTI ratio: 0.47  AORTA Ao Root diam: 2.90 cm Ao Asc diam:  3.30 cm Ao Desc diam: 1.80 cm MITRAL VALVE                  TRICUSPID VALVE MV Area (PHT): 5.20 cm       TR Peak grad:   73.3 mmHg MV Decel Time: 146 msec       TR Vmax:        428.00 cm/s MR Peak grad:    107.3 mmHg MR Mean grad:    77.0 mmHg    SHUNTS MR Vmax:         518.00 cm/s  Systemic VTI:  0.22 m MR Vmean:         418.5 cm/s   Systemic Diam: 2.40 cm MR PISA:         1.90 cm MR PISA Eff ROA: 14 mm MR PISA Radius:  0.55 cm MV E velocity: 114.50 cm/s MV A velocity: 56.30 cm/s MV E/A ratio:  2.03 Sreedhar reddy Madireddy Electronically signed by Alean reddy Madireddy Signature Date/Time: 02/27/2024/11:36:21 AM    Final     Microbiology: No results found for this or any previous visit.  Labs: CBC: Recent Labs  Lab 03/21/24 1430 03/22/24 0310 03/24/24 0259  WBC 5.8 7.4 5.5  HGB 10.4* 9.7* 9.9*  HCT 32.4* 29.8* 30.3*  MCV 92.0 90.6 89.1  PLT 150 148* 144*   Basic Metabolic Panel: Recent Labs  Lab 03/21/24 1430 03/22/24 0310 03/23/24 0251 03/24/24 0259  NA 139 140 141 139  K 4.4 3.5 3.8 3.7  CL 101 106 105 102  CO2 23 21* 23 23  GLUCOSE 106* 130* 120* 112*  BUN 73* 77* 76* 76*  CREATININE 4.97* 5.07* 5.14* 5.13*  CALCIUM 9.5 8.8* 8.6* 8.7*  MG  --  2.4  --   --   PHOS  --  5.0*  --   --    Liver Function Tests: No results for input(s): AST, ALT, ALKPHOS, BILITOT, PROT, ALBUMIN in the last 168 hours. CBG: No results for input(s): GLUCAP in the last 168 hours.  Discharge time spent: greater than 30 minutes.  Signed: Elidia Toribio Furnace, MD [facility] 03/24/2024

## 2024-05-10 ENCOUNTER — Other Ambulatory Visit: Payer: Self-pay

## 2024-05-10 ENCOUNTER — Emergency Department (HOSPITAL_BASED_OUTPATIENT_CLINIC_OR_DEPARTMENT_OTHER)

## 2024-05-10 ENCOUNTER — Emergency Department (HOSPITAL_BASED_OUTPATIENT_CLINIC_OR_DEPARTMENT_OTHER)
Admission: EM | Admit: 2024-05-10 | Discharge: 2024-05-10 | Disposition: A | Discharge status: Home / Self Care | Attending: Emergency Medicine | Admitting: Emergency Medicine

## 2024-05-10 DIAGNOSIS — N189 Chronic kidney disease, unspecified: Secondary | ICD-10-CM | POA: Insufficient documentation

## 2024-05-10 DIAGNOSIS — R0789 Other chest pain: Secondary | ICD-10-CM | POA: Insufficient documentation

## 2024-05-10 DIAGNOSIS — I509 Heart failure, unspecified: Secondary | ICD-10-CM | POA: Insufficient documentation

## 2024-05-10 DIAGNOSIS — Z79899 Other long term (current) drug therapy: Secondary | ICD-10-CM | POA: Insufficient documentation

## 2024-05-10 DIAGNOSIS — I130 Hypertensive heart and chronic kidney disease with heart failure and stage 1 through stage 4 chronic kidney disease, or unspecified chronic kidney disease: Secondary | ICD-10-CM | POA: Diagnosis not present

## 2024-05-10 DIAGNOSIS — Z7982 Long term (current) use of aspirin: Secondary | ICD-10-CM | POA: Insufficient documentation

## 2024-05-10 DIAGNOSIS — R0602 Shortness of breath: Secondary | ICD-10-CM | POA: Diagnosis present

## 2024-05-10 LAB — BASIC METABOLIC PANEL WITH GFR
Anion gap: 17 — ABNORMAL HIGH (ref 5–15)
BUN: 87 mg/dL — ABNORMAL HIGH (ref 8–23)
CO2: 22 mmol/L (ref 22–32)
Calcium: 9.6 mg/dL (ref 8.9–10.3)
Chloride: 102 mmol/L (ref 98–111)
Creatinine, Ser: 5.15 mg/dL — ABNORMAL HIGH (ref 0.61–1.24)
GFR, Estimated: 10 mL/min — ABNORMAL LOW (ref >60)
Glucose, Bld: 152 mg/dL — ABNORMAL HIGH (ref 70–99)
Potassium: 4.6 mmol/L (ref 3.5–5.1)
Sodium: 140 mmol/L (ref 135–145)

## 2024-05-10 LAB — CBC
HCT: 31.3 % — ABNORMAL LOW (ref 39.0–52.0)
Hemoglobin: 10.2 g/dL — ABNORMAL LOW (ref 13.0–17.0)
MCH: 29.1 pg (ref 26.0–34.0)
MCHC: 32.6 g/dL (ref 30.0–36.0)
MCV: 89.4 fL (ref 80.0–100.0)
Platelets: 98 K/uL — ABNORMAL LOW (ref 150–400)
RBC: 3.5 MIL/uL — ABNORMAL LOW (ref 4.22–5.81)
RDW: 15 % (ref 11.5–15.5)
WBC: 6.8 K/uL (ref 4.0–10.5)
nRBC: 0 % (ref 0.0–0.2)

## 2024-05-10 LAB — TROPONIN T, HIGH SENSITIVITY
Troponin T High Sensitivity: 92 ng/L — ABNORMAL HIGH (ref 0–19)
Troponin T High Sensitivity: 89 ng/L — ABNORMAL HIGH (ref 0–19)

## 2024-05-10 NOTE — Discharge Instructions (Addendum)
 Thank you for allowing us  to care for you today. You were evaluated for progressive shortness of breath related to your history of congestive heart failure and chronic kidney disease. After assessment and consultation with the nephrology team, it was recommended that you be discharged home with close follow-up to monitor your kidney function and fluid status."  Instructions:  -Take all your current medications exactly as prescribed, including antihypertensives and diuretics. Do not stop or change doses without your doctor's guidance.  -Monitor your weight daily and note any sudden increases, which may indicate fluid retention.  -Elevate the head of your bed if you experience shortness of breath while lying down.  -Watch for red-flag symptoms, including worsening shortness of breath, chest pain, swelling of the legs, dizziness, or decreased urine output. Seek medical attention immediately if these occur.  Follow up with nephrology as scheduled to review kidney function and adjust medications as needed. Also follow up with your primary care provider or cardiologist.  It was a pleasure caring for you today, and we wish you continued improvement in your breathing and overall health. Please reach out promptly if any concerning symptoms develop.

## 2024-05-10 NOTE — Progress Notes (Signed)
 Informed of patient in ER, discussed with ER MD. CKD5 followed by us  with AVF (ready to use 02/09/2023). Presents with SOB, CXR with stable right basilar airspace opacity likely assocated effusion, on RA. Labs are not far off as compared to baseline. Has discharge instructions in regards to torsemide  from recent admission, recommend following that. Provided there are no other indications for admission, recommend that patient call the office in the AM for a sooner appt to see if he needs to start dialysis as an outpatient sooner rather than later. Please call with any questions/concerns in the interim.  Ephriam Stank, MD [facility]

## 2024-05-10 NOTE — ED Notes (Signed)
 Pt given discharge instructions. Opportunities given for questions. IV removed. Pt stable at time of discharge.

## 2024-05-10 NOTE — ED Provider Notes (Incomplete)
 [place] EMERGENCY DEPARTMENT AT [facility] Provider Note  CSN: [id number] Arrival date & time: 05/10/24 8147  Chief Complaint(s) Chest Pain  HPI Jason Beltran is a 83 y.o. male with a history of congestive heart failure (recent admission 2 months ago), stage 6 chronic kidney disease, hypertension, and pre-diabetes, presenting with progressive shortness of breath over the last two days.  He reports that Sunday night he first noticed shortness of breath, which worsened overnight. Throughout the day yesterday, he continued to feel unwell with persistent dyspnea. Last night, he experienced severe shortness of breath while lying in bed; he found relief by elevating the head of the bed. He also experienced chest tightness during transport from [place] to the facility, lasting approximately 45 minutes, which he attributes potentially to seat belt pressure and stress. He denies diaphoresis, syncope, or palpitations during the episode.  He denies new ankle swelling, weight gain, fever, cough, nausea, vomiting, or changes in urinary output. He monitors his weight regularly and has not noticed any acute increase. His home medications include three antihypertensives, two diuretics, hydralazine  twice daily, Zocor , and Coreg  twice daily. He is aware of his chronic kidney disease and history of fluid overload from prior CHF exacerbations.  The shortness of breath worsens with activity but improves with upright positioning. He denies current chest pain, palpitations, or dizziness. No recent sick contacts or new medications were reported.  Past Medical History Past Medical History:  Diagnosis Date  . Abnormal EKG 01/03/2022  . AKI (acute kidney injury) (HCC) 01/03/2022  . Anemia of chronic disease   . Aortic stenosis 05/08/2018   Very mild mean 9 mm Hg  . CHF exacerbation (HCC) 01/07/2022  . Chronic sinusitis   . CKD (chronic kidney disease)   . CKD (chronic kidney disease) stage 5, GFR  less than 15 ml/min (HCC) 05/06/2018  . COVID-19 virus infection 12/07/2020  . Diabetes mellitus type 2, uncontrolled   . Diastolic CHF (HCC)   . Diastolic dysfunction 05/08/2018  . Essential hypertension 05/06/2018  . GERD (gastroesophageal reflux disease) 01/03/2022  . Heart murmur   . Hyperparathyroidism (HCC)   . Hypertension   . Hyponatremia 01/03/2022  . Nausea and vomiting 01/03/2022  . Neuropathy, peripheral   . New onset a-fib (HCC) 01/03/2022  . Obesity   . Shortness of breath 01/03/2022  . Type 2 diabetes mellitus (HCC) 05/06/2018   Patient Active Problem List   Diagnosis Date Noted  . Type 2 diabetes mellitus with hyperlipidemia (HCC) 03/23/2024  . Acute hypoxic respiratory failure (HCC) 03/22/2024  . Physical deconditioning 03/22/2024  . Acute on chronic diastolic (congestive) heart failure (HCC) 03/21/2024  . CKD (chronic kidney disease)   . Heart murmur   . CHF (congestive heart failure) (HCC) 01/07/2022  . Hyponatremia 01/03/2022  . GERD (gastroesophageal reflux disease) 01/03/2022  . AKI (acute kidney injury) (HCC) 01/03/2022  . Abnormal EKG 01/03/2022  . Shortness of breath 01/03/2022  . COVID-19 virus infection 12/07/2020  . Obesity   . Neuropathy, peripheral   . Hypertension   . Hyperparathyroidism (HCC)   . Diabetes mellitus type 2, uncontrolled   . Chronic sinusitis   . Anemia of chronic disease   . Aortic stenosis 05/08/2018  . Diastolic dysfunction 05/08/2018  . Type 2 diabetes mellitus (HCC) 05/06/2018  . Essential hypertension 05/06/2018  . CKD (chronic kidney disease) stage 5, GFR less than 15 ml/min (HCC) 05/06/2018   Home Medication(s) Prior to Admission medications   Medication Sig Start Date End  Date Taking? Authorizing Provider  aspirin  81 MG EC tablet Take 81 mg by mouth at bedtime.    [provider]  carvedilol  (COREG ) 6.25 MG tablet Take 6.25 mg by mouth 2 (two) times daily with a meal.    [provider]   Cholecalciferol (VITAMIN D3) 50 MCG (2000 UT) TABS Take 2,000 Units by mouth daily.    [provider]  Coenzyme Q10 (COQ10) 200 MG CAPS Take 200 mg by mouth daily.    [provider]  febuxostat  (ULORIC ) 40 MG tablet Take 20 mg by mouth daily.    [provider]  ferrous sulfate  325 (65 FE) MG tablet Take 1 tablet (325 mg total) by mouth daily with breakfast. 03/25/24   Arrien, Elidia Sieving, MD  gabapentin  (NEURONTIN ) 100 MG capsule Take 100 mg by mouth 2 (two) times daily. 01/11/24   [provider]  hydrALAZINE  (APRESOLINE ) 50 MG tablet Take 50 mg by mouth in the morning and at bedtime.    [provider]  ipratropium (ATROVENT) 0.06 % nasal spray Place 2 sprays into both nostrils daily.    [provider]  Multiple Vitamin (MULTIVITAMIN) tablet Take 1 tablet by mouth daily.    [provider]  Resveratrol-Quercetin (RESVERATROL PLUS) 100-100 MG TABS Take 1 capsule by mouth daily.    [provider]  simvastatin  (ZOCOR ) 20 MG tablet Take 20 mg by mouth at bedtime.    [provider]  sodium bicarbonate  650 MG tablet Take 650 mg by mouth 2 (two) times daily. 02/12/24   [provider]  torsemide  (DEMADEX ) 20 MG tablet Take 2 tablets (40 mg total) by mouth daily. In case of weight gain 2 to 3 lbs in 24 hrs or 5 lbs in 7 days, take 3 tablets daily until weight back to baseline. 03/25/24   Arrien, Elidia Sieving, MD                                                                                                                                    Past Surgical History Past Surgical History:  Procedure Laterality Date  . ANKLE SURGERY Right 01/20/2012  . AV FISTULA PLACEMENT Left 11/11/2022   Procedure: LEFT ARM BRACHIOCEPHALIC FISTULA CREATION;  Surgeon: Lanis Fonda BRAVO, MD;  Location: [facility] OR;  Service: Vascular;  Laterality: Left;  . NASAL SINUS SURGERY    . VASECTOMY     Family History Family History   Problem Relation Age of Onset  . Polymyalgia rheumatica Mother   . Congestive Heart Failure Mother   . Heart failure Father   . Stroke Paternal Grandmother   . Stroke Paternal Aunt     Social History Social History   Tobacco Use  . Smoking status: Never  . Smokeless tobacco: Never  Vaping Use  . Vaping status: Never Used  Substance Use Topics  . Alcohol use: Yes    Comment: occasional glass  of wine  . Drug use: Never   Allergies Actos [pioglitazone], Lisinopril, Motrin [ibuprofen], and Trilipix [choline fenofibrate]  Review of Systems A thorough review of systems was obtained and all systems are negative except as noted in the HPI and PMH.   Physical Exam Vital Signs  I have reviewed the triage vital signs BP (!) 147/88   Pulse 62   Temp 98 F (36.7 C) (Oral)   Resp 16   Ht 5' 8 (1.727 m)   Wt 74.4 kg   SpO2 97%   BMI 24.94 kg/m  Physical Exam  ED Results and Treatments Labs (all labs ordered are listed, but only abnormal results are displayed) Labs Reviewed  BASIC METABOLIC PANEL WITH GFR  CBC  TROPONIN T, HIGH SENSITIVITY                                                                                                                          Radiology No results found.  Pertinent labs & imaging results that were available during my care of the patient were reviewed by me and considered in my medical decision making (see MDM for details).  Medications Ordered in ED Medications - No data to display                                                                                                                                   Procedures Procedures  (including critical care time)  Medical Decision Making / ED Course    Medical Decision Making:    Jason Beltran is a 83 y.o. male ***. The complaint involves an extensive differential diagnosis and also carries with it a high risk of complications and morbidity.  Serious etiology was considered.  Ddx includes but is not limited to: ***  Complete initial physical exam performed, notably the patient was in ***.    Reviewed and confirmed nursing documentation for past medical history, family history, social history.  Vital signs reviewed.       Clinical Course as of 05/10/24 2144  Tue May 10, 2024  2011 DG Chest [place] 1 View [MA]    Clinical Course User Index [MA] Bernadine Manos, MD       Additional history obtained: -Additional history obtained from family -External records from outside source obtained and reviewed including: Chart review including previous notes, labs, imaging, consultation notes including  ***   Lab Tests: -  I ordered, reviewed, and interpreted labs.   The pertinent results include:   Labs Reviewed  BASIC METABOLIC PANEL WITH GFR - Abnormal; Notable for the following components:      Result Value   Glucose, Bld 152 (*)    BUN 87 (*)    Creatinine, Ser 5.15 (*)    GFR, Estimated 10 (*)    Anion gap 17 (*)    All other components within normal limits  CBC - Abnormal; Notable for the following components:   RBC 3.50 (*)    Hemoglobin 10.2 (*)    HCT 31.3 (*)    Platelets 98 (*)    All other components within normal limits  TROPONIN T, HIGH SENSITIVITY - Abnormal; Notable for the following components:   Troponin T High Sensitivity 92 (*)    All other components within normal limits  TROPONIN T, HIGH SENSITIVITY - Abnormal; Notable for the following components:   Troponin T High Sensitivity 89 (*)    All other components within normal limits    Notable for ***  EKG   EKG Interpretation Date/Time:    Ventricular Rate:    PR Interval:    QRS Duration:    QT Interval:    QTC Calculation:   R Axis:      Text Interpretation:           Imaging Studies ordered: I ordered imaging studies including Chest Xray I independently visualized the following imaging with scope of interpretation limited to determining acute life threatening  conditions related to emergency care; findings noted above I agree with the radiologist interpretation If any imaging was obtained with contrast I closely monitored patient for any possible adverse reaction a/w contrast administration in the emergency department   Medicines ordered and prescription drug management: No orders of the defined types were placed in this encounter.   -I have reviewed the patients home medicines and have made adjustments as needed   Consultations Obtained: I requested consultation with the Nephrology,  and discussed lab and imaging findings as well as pertinent plan - they recommend: Discharge the patient home with current medication and follow-up appointment with nephrology  Cardiac Monitoring: Continuous pulse oximetry interpreted by myself, 98% on room air.   Reevaluation: After the interventions noted above, I reevaluated the patient and found that they have improved  Co morbidities that complicate the patient evaluation . Past Medical History:  Diagnosis Date  . Abnormal EKG 01/03/2022  . AKI (acute kidney injury) (HCC) 01/03/2022  . Anemia of chronic disease   . Aortic stenosis 05/08/2018   Very mild mean 9 mm Hg  . CHF exacerbation (HCC) 01/07/2022  . Chronic sinusitis   . CKD (chronic kidney disease)   . CKD (chronic kidney disease) stage 5, GFR less than 15 ml/min (HCC) 05/06/2018  . COVID-19 virus infection 12/07/2020  . Diabetes mellitus type 2, uncontrolled   . Diastolic CHF (HCC)   . Diastolic dysfunction 05/08/2018  . Essential hypertension 05/06/2018  . GERD (gastroesophageal reflux disease) 01/03/2022  . Heart murmur   . Hyperparathyroidism (HCC)   . Hypertension   . Hyponatremia 01/03/2022  . Nausea and vomiting 01/03/2022  . Neuropathy, peripheral   . New onset a-fib (HCC) 01/03/2022  . Obesity   . Shortness of breath 01/03/2022  . Type 2 diabetes mellitus (HCC) 05/06/2018      Dispostion: Disposition decision including  need for hospitalization was considered, and patient discharged from emergency department.    Final Clinical  Impression(s) / ED Diagnoses Final diagnoses:  None

## 2024-05-10 NOTE — ED Triage Notes (Signed)
 Pt POV reporting SOB that began Sunday and L side chest pain that began 45 min ago. Hx CHF, taking lasix  as prescribed.

## 2024-05-24 NOTE — ED Provider Notes (Signed)
 [place] EMERGENCY DEPARTMENT AT [facility] Provider Note   CSN: [id number] Arrival date & time: 05/10/24  8147     Patient presents with: Chest Pain   Jason Beltran is a 83 y.o. male.   HPI Patient history of congestive heart failure, chronic kidney disease, hypertension increasing shortness of breath over 2 days.  Last night patient had shortness of breath worse with lying flat somewhat improved with elevating his head.  Yesterday patient had persistent shortness of breath and feeling some chest discomfort for about 45 minutes yesterday while being transported to this facility.  Patient has a history of CHF with fluid overload.    Prior to Admission medications   Medication Sig Start Date End Date Taking? Authorizing Provider  aspirin  81 MG EC tablet Take 81 mg by mouth at bedtime.    [provider]  carvedilol  (COREG ) 6.25 MG tablet Take 6.25 mg by mouth 2 (two) times daily with a meal.    [provider]  Cholecalciferol (VITAMIN D3) 50 MCG (2000 UT) TABS Take 2,000 Units by mouth daily.    [provider]  Coenzyme Q10 (COQ10) 200 MG CAPS Take 200 mg by mouth daily.    [provider]  febuxostat  (ULORIC ) 40 MG tablet Take 20 mg by mouth daily.    [provider]  ferrous sulfate  325 (65 FE) MG tablet Take 1 tablet (325 mg total) by mouth daily with breakfast. 03/25/24   Arrien, Elidia Sieving, MD  gabapentin  (NEURONTIN ) 100 MG capsule Take 100 mg by mouth 2 (two) times daily. 01/11/24   [provider]  hydrALAZINE  (APRESOLINE ) 50 MG tablet Take 50 mg by mouth in the morning and at bedtime.    [provider]  ipratropium (ATROVENT) 0.06 % nasal spray Place 2 sprays into both nostrils daily.    [provider]  Multiple Vitamin (MULTIVITAMIN) tablet Take 1 tablet by mouth daily.    [provider]  Resveratrol-Quercetin (RESVERATROL PLUS) 100-100 MG TABS Take 1 capsule by mouth daily.     [provider]  simvastatin  (ZOCOR ) 20 MG tablet Take 20 mg by mouth at bedtime.    [provider]  sodium bicarbonate  650 MG tablet Take 650 mg by mouth 2 (two) times daily. 02/12/24   [provider]  torsemide  (DEMADEX ) 20 MG tablet Take 2 tablets (40 mg total) by mouth daily. In case of weight gain 2 to 3 lbs in 24 hrs or 5 lbs in 7 days, take 3 tablets daily until weight back to baseline. 03/25/24   Arrien, Elidia Sieving, MD    Allergies: Actos [pioglitazone], Lisinopril, Motrin [ibuprofen], and Trilipix [choline fenofibrate]    Review of Systems  Updated Vital Signs BP (!) 168/74 (BP Location: Right Arm)   Pulse 77   Temp 98.8 F (37.1 C) (Oral)   Resp (!) 22   Ht 5' 8 (1.727 m)   Wt 74.4 kg   SpO2 94%   BMI 24.94 kg/m   Physical Exam Constitutional:      Comments: Nontoxic no respiratory distress at rest  HENT:     Mouth/Throat:     Pharynx: Oropharynx is clear.  Cardiovascular:     Rate and Rhythm: Normal rate and regular rhythm.  Pulmonary:     Effort: Pulmonary effort is normal.     Breath sounds: Normal breath sounds.  Abdominal:     Palpations: Abdomen is soft.  Skin:    General: Skin is warm and  dry.  Neurological:     General: No focal deficit present.     (all labs ordered are listed, but only abnormal results are displayed) Labs Reviewed  BASIC METABOLIC PANEL WITH GFR - Abnormal; Notable for the following components:      Result Value   Glucose, Bld 152 (*)    BUN 87 (*)    Creatinine, Ser 5.15 (*)    GFR, Estimated 10 (*)    Anion gap 17 (*)    All other components within normal limits  CBC - Abnormal; Notable for the following components:   RBC 3.50 (*)    Hemoglobin 10.2 (*)    HCT 31.3 (*)    Platelets 98 (*)    All other components within normal limits  TROPONIN T, HIGH SENSITIVITY - Abnormal; Notable for the following components:   Troponin T High Sensitivity 92 (*)    All other components within  normal limits  TROPONIN T, HIGH SENSITIVITY - Abnormal; Notable for the following components:   Troponin T High Sensitivity 89 (*)    All other components within normal limits    EKG: EKG Interpretation Date/Time:  Tuesday May 10 2024 19:01:56 EDT Ventricular Rate:  65 PR Interval:  194 QRS Duration:  104 QT Interval:  444 QTC Calculation: 461 R Axis:   93  Text Interpretation: Normal sinus rhythm Rightward axis Abnormal ECG When compared with ECG of 21-Mar-2024 14:17, No significant change was found Confirmed by Armenta Canning [phone]) on 05/10/2024 8:00:14 PM  Radiology: No results found.   Procedures   Medications Ordered in the ED - No data to display  Clinical Course as of 05/24/24 9277  Tue May 10, 2024  2011 DG Chest [place] 1 View [MA]    Clinical Course User Index [MA] Bernadine Manos, MD                                 Medical Decision Making Amount and/or Complexity of Data Reviewed Labs: ordered. Radiology: ordered. Decision-making details documented in ED Course.   Patient seen in conjunction with resident provider Dr.Azadegan who has additional notes on the chart.  BUN 87 creatinine 5.1 GFR 10 white count 6.8 H&H 10.231 platelets 98 troponin 89  Chest x-ray interpreted radiology stable atelectasis or right basilar effusion no change from previous.  Consult: Dr. Dennise nephrology has reviewed and does not recommend inpatient dialysis.  Patient is stable from perspective of renal function and baseline CHF.  Recommendation is to follow-up on outpatient basis regarding renal function.  Patient does not appear to have any significant acute CHF exacerbation or other unstable finding today.  Plan will be to continue outpatient management.      Final diagnoses:  Shortness of breath    ED Discharge Orders     None          Armenta Canning, MD 05/24/24 [id number]

## 2024-07-26 ENCOUNTER — Ambulatory Visit: Attending: Cardiology | Admitting: Cardiology

## 2024-07-26 ENCOUNTER — Encounter: Payer: Self-pay | Admitting: Cardiology

## 2024-07-26 VITALS — BP 124/54 | HR 64 | Ht 68.0 in | Wt 174.6 lb

## 2024-07-26 DIAGNOSIS — N184 Chronic kidney disease, stage 4 (severe): Secondary | ICD-10-CM | POA: Diagnosis not present

## 2024-07-26 DIAGNOSIS — D631 Anemia in chronic kidney disease: Secondary | ICD-10-CM | POA: Insufficient documentation

## 2024-07-26 DIAGNOSIS — I110 Hypertensive heart disease with heart failure: Secondary | ICD-10-CM | POA: Insufficient documentation

## 2024-07-26 DIAGNOSIS — I350 Nonrheumatic aortic (valve) stenosis: Secondary | ICD-10-CM | POA: Diagnosis not present

## 2024-07-26 DIAGNOSIS — N186 End stage renal disease: Secondary | ICD-10-CM | POA: Diagnosis not present

## 2024-07-26 DIAGNOSIS — I5042 Chronic combined systolic (congestive) and diastolic (congestive) heart failure: Secondary | ICD-10-CM | POA: Diagnosis present

## 2024-07-26 NOTE — Patient Instructions (Signed)

## 2024-07-26 NOTE — Progress Notes (Signed)
 Cardiology Office Note:    Date:  07/26/2024   ID:  Jason Beltran, Jason Beltran 02-Mar-1941, MRN [id number]  PCP:  Jason Lauraine DASEN, NP  Cardiologist:  Jason Leiter, MD    Referring MD: Jason Lauraine DASEN, NP    ASSESSMENT:    1. Hypertensive heart disease with chronic combined systolic and diastolic congestive heart failure (HCC)   2. ESRD (end stage renal disease) (HCC)   3. Nonrheumatic aortic valve stenosis   4. Anemia due to stage 4 chronic kidney disease (HCC)    PLAN:    In order of problems listed above:  Jason Beltran continues to be managed with his stage V CKD awaiting institution of renal placement therapy with his current loop diuretic with compensated heart failure Continue torsemide  60 mg daily along with his and hydralazine  with good blood pressure control Mild aortic stenosis repeat echocardiogram in a few years Anemia hyperlipidemia is followed by his nephrologist they draw labs in the office he is on a low-dose of intermediate intensity statin   Next appointment: 6months   Medication Adjustments/Labs and Tests Ordered: Current medicines are reviewed at length with the patient today.  Concerns regarding medicines are outlined above.  No orders of the defined types were placed in this encounter.  No orders of the defined types were placed in this encounter.    History of Present Illness:    Jason Beltran is a 83 y.o. male with a hx of hypertensive heart disease with heart failure and stage 5 CKDaortic stenosis and anemia related to his chronic kidney disease last seen in 11/2022.  He had an echocardiogram performed in June showing a normal ejection fraction mild aortic stenosis and mild to moderate mitral regurgitation.  He can continues to be followed by nephrology has not started hemodialysis To manage fluid he now takes 60 mg of torsemide  daily He has some nausea in the mornings he feels weak but he is not having edema shortness of breath chest pain palpitation  or syncope Labs are followed by the nephrology (  Compliance with diet, lifestyle and medications: Yes Past Medical History:  Diagnosis Date   Abnormal EKG 01/03/2022   AKI (acute kidney injury) 01/03/2022   Anemia of chronic disease    Aortic stenosis 05/08/2018   Very mild mean 9 mm Hg   CHF exacerbation (HCC) 01/07/2022   Chronic sinusitis    CKD (chronic kidney disease)    CKD (chronic kidney disease) stage 5, GFR less than 15 ml/min (HCC) 05/06/2018   COVID-19 virus infection 12/07/2020   Diabetes mellitus type 2, uncontrolled    Diastolic CHF (HCC)    Diastolic dysfunction 05/08/2018   Essential hypertension 05/06/2018   GERD (gastroesophageal reflux disease) 01/03/2022   Heart murmur    Hyperparathyroidism    Hypertension    Hyponatremia 01/03/2022   Nausea and vomiting 01/03/2022   Neuropathy, peripheral    New onset a-fib (HCC) 01/03/2022   Obesity    Shortness of breath 01/03/2022   Type 2 diabetes mellitus (HCC) 05/06/2018    Current Medications: Current Meds  Medication Sig   aspirin  81 MG EC tablet Take 81 mg by mouth at bedtime.   carvedilol  (COREG ) 6.25 MG tablet Take 6.25 mg by mouth 2 (two) times daily with a meal.   Cholecalciferol (VITAMIN D3) 50 MCG (2000 UT) TABS Take 2,000 Units by mouth daily.   Coenzyme Q10 (COQ10) 200 MG CAPS Take 200 mg by mouth daily.   febuxostat  (ULORIC ) 40  MG tablet Take 20 mg by mouth daily.   gabapentin  (NEURONTIN ) 100 MG capsule Take 100 mg by mouth 2 (two) times daily.   hydrALAZINE  (APRESOLINE ) 50 MG tablet Take 50 mg by mouth in the morning and at bedtime.   ipratropium (ATROVENT) 0.06 % nasal spray Place 2 sprays into both nostrils daily.   Multiple Vitamin (MULTIVITAMIN) tablet Take 1 tablet by mouth daily.   Resveratrol-Quercetin (RESVERATROL PLUS) 100-100 MG TABS Take 1 capsule by mouth daily.   simvastatin  (ZOCOR ) 20 MG tablet Take 20 mg by mouth at bedtime.   sodium bicarbonate  650 MG tablet Take 650 mg by mouth  2 (two) times daily.   torsemide  (DEMADEX ) 20 MG tablet Take 2 tablets (40 mg total) by mouth daily. In case of weight gain 2 to 3 lbs in 24 hrs or 5 lbs in 7 days, take 3 tablets daily until weight back to baseline. (Patient taking differently: Take 60 mg by mouth daily. In case of weight gain 2 to 3 lbs in 24 hrs or 5 lbs in 7 days, take 3 tablets daily until weight back to baseline.)      EKGs/Labs/Other Studies Reviewed:    The following studies were reviewed today:  Cardiac Studies & Procedures   ______________________________________________________________________________________________     ECHOCARDIOGRAM  ECHOCARDIOGRAM COMPLETE 02/25/2024  Narrative ECHOCARDIOGRAM REPORT    Patient Name:   Jason Beltran Date of Exam: 02/25/2024 Medical Rec #:  [id number]    Height:       68.0 in Accession #:    [id number]   Weight:       172.6 lb Date of Birth:  01-24-1941    BSA:          1.920 m Patient Age:    83 years     BP:           137/67 mmHg Patient Gender: M            HR:           63 bpm. Exam Location:  [facility]  Procedure: 2D Echo, Cardiac Doppler, Color Doppler and Strain Analysis (Both Spectral and Color Flow Doppler were utilized during procedure).  Indications:    Chronic combined systolic and diastolic congestive heart failure (HCC) [I50.42 (ICD-10-CM)]; Nonrheumatic aortic valve stenosis [I35.0 (ICD-10-CM)]  History:        Patient has prior history of Echocardiogram examinations, most recent 01/08/2022. CHF, Abnormal ECG, Aortic Valve Disease and Mitral Valve Disease, Signs/Symptoms:Shortness of Breath; Risk Factors:Diabetes and Hypertension.  Sonographer:    Charlie Jointer RDCS Referring Phys: [id number] ALEAN SAUNDERS MADIREDDY  IMPRESSIONS   1. Left ventricular ejection fraction, by estimation, is 50 to 55%. The left ventricle has low normal function. The left ventricle demonstrates global hypokinesis. Left ventricular diastolic parameters are consistent with  Grade II diastolic dysfunction (pseudonormalization). The average left ventricular global longitudinal strain is -9.1 %. The global longitudinal strain is abnormal. 2. Right ventricular systolic function is normal. The right ventricular size is normal. There is severely elevated pulmonary artery systolic pressure. 3. Left atrial size was severely dilated. 4. The mitral valve is degenerative. Mild to moderate mitral valve regurgitation. No evidence of mitral stenosis. 5. The aortic valve is normal in structure. There is mild calcification of the aortic valve. There is moderate thickening of the aortic valve. Aortic valve regurgitation is not visualized. Trace to mild aortic stenosis is present. Aortic valve area, by VTI measures 2.13 cm. Aortic valve mean gradient measures 10.0 mmHg.  Aortic valve Vmax measures 2.07 m/s. 6. The inferior vena cava is normal in size with greater than 50% respiratory variability, suggesting right atrial pressure of 3 mmHg.  Comparison(s): In comparison prior TTE report from 07/24/2021 noted LVEF 55-60%, grade 2 DD, mild MR, mild AS, RVSP 39mm Hg.  FINDINGS Left Ventricle: Left ventricular ejection fraction, by estimation, is 50 to 55%. The left ventricle has low normal function. The left ventricle demonstrates global hypokinesis. The average left ventricular global longitudinal strain is -9.1 %. Strain was performed and the global longitudinal strain is abnormal. The left ventricular internal cavity size was normal in size. There is no left ventricular hypertrophy. Left ventricular diastolic parameters are consistent with Grade II diastolic dysfunction (pseudonormalization).  Right Ventricle: The right ventricular size is normal. Right vetricular wall thickness was not well visualized. Right ventricular systolic function is normal. There is severely elevated pulmonary artery systolic pressure. The tricuspid regurgitant velocity is 4.28 m/s, and with an assumed right  atrial pressure of 3 mmHg, the estimated right ventricular systolic pressure is 76.3 mmHg.  Left Atrium: Left atrial size was severely dilated.  Right Atrium: Right atrial size was normal in size.  Pericardium: There is no evidence of pericardial effusion.  Mitral Valve: The mitral valve is degenerative in appearance. Mild mitral annular calcification. Mild to moderate mitral valve regurgitation. No evidence of mitral valve stenosis.  Tricuspid Valve: The tricuspid valve is not well visualized. Tricuspid valve regurgitation is mild . No evidence of tricuspid stenosis.  Aortic Valve: The aortic valve is normal in structure. There is mild calcification of the aortic valve. There is moderate thickening of the aortic valve. Aortic valve regurgitation is not visualized. No aortic stenosis is present. Aortic valve mean gradient measures 10.0 mmHg. Aortic valve peak gradient measures 17.1 mmHg. Aortic valve area, by VTI measures 2.13 cm.  Pulmonic Valve: The pulmonic valve was not well visualized. Pulmonic valve regurgitation is mild. No evidence of pulmonic stenosis.  Aorta: The aortic root and ascending aorta are structurally normal, with no evidence of dilitation.  Venous: The inferior vena cava is normal in size with greater than 50% respiratory variability, suggesting right atrial pressure of 3 mmHg.  IAS/Shunts: The interatrial septum was not well visualized.   LEFT VENTRICLE PLAX 2D LVIDd:         6.10 cm   Diastology LVIDs:         4.90 cm   LV e' medial:    5.60 cm/s LV PW:         1.00 cm   LV E/e' medial:  20.4 LV IVS:        0.90 cm   LV e' lateral:   6.80 cm/s LVOT diam:     2.40 cm   LV E/e' lateral: 16.8 LV SV:         99 LV SV Index:   51        2D Longitudinal Strain LVOT Area:     4.52 cm  2D Strain GLS Avg:     -9.1 %   RIGHT VENTRICLE             IVC RV Basal diam:  3.90 cm     IVC diam: 1.90 cm RV Mid diam:    2.90 cm RV S prime:     12.00 cm/s TAPSE (M-mode):  3.5 cm  LEFT ATRIUM              Index  RIGHT ATRIUM           Index LA diam:        4.20 cm  2.19 cm/m   RA Area:     22.40 cm LA Vol (A2C):   76.7 ml  39.95 ml/m  RA Volume:   72.10 ml  37.56 ml/m LA Vol (A4C):   120.0 ml 62.51 ml/m LA Biplane Vol: 98.2 ml  51.15 ml/m AORTIC VALVE AV Area (Vmax):    2.25 cm AV Area (Vmean):   2.07 cm AV Area (VTI):     2.13 cm AV Vmax:           207.00 cm/s AV Vmean:          143.000 cm/s AV VTI:            0.462 m AV Peak Grad:      17.1 mmHg AV Mean Grad:      10.0 mmHg LVOT Vmax:         103.00 cm/s LVOT Vmean:        65.500 cm/s LVOT VTI:          0.218 m LVOT/AV VTI ratio: 0.47  AORTA Ao Root diam: 2.90 cm Ao Asc diam:  3.30 cm Ao Desc diam: 1.80 cm  MITRAL VALVE                  TRICUSPID VALVE MV Area (PHT): 5.20 cm       TR Peak grad:   73.3 mmHg MV Decel Time: 146 msec       TR Vmax:        428.00 cm/s MR Peak grad:    107.3 mmHg MR Mean grad:    77.0 mmHg    SHUNTS MR Vmax:         518.00 cm/s  Systemic VTI:  0.22 m MR Vmean:        418.5 cm/s   Systemic Diam: 2.40 cm MR PISA:         1.90 cm MR PISA Eff ROA: 14 mm MR PISA Radius:  0.55 cm MV E velocity: 114.50 cm/s MV A velocity: 56.30 cm/s MV E/A ratio:  2.03  Sreedhar reddy Madireddy Electronically signed by Alean reddy Madireddy Signature Date/Time: 02/27/2024/11:36:21 AM    Final          ______________________________________________________________________________________________          Recent Labs: 03/21/2024: Pro Brain Natriuretic Peptide >35,000.0 03/22/2024: Magnesium 2.4; TSH 3.079 05/10/2024: BUN 87; Creatinine, Ser 5.15; Hemoglobin 10.2; Platelets 98; Potassium 4.6; Sodium 140  Recent Lipid Panel No results found for: CHOL, TRIG, HDL, CHOLHDL, VLDL, LDLCALC, LDLDIRECT  Physical Exam:    VS:  BP (!) 124/54   Pulse 64   Ht 5' 8 (1.727 m)   Wt 174 lb 9.6 oz (79.2 kg)   SpO2 95%   BMI 26.55 kg/m     Wt  Readings from Last 3 Encounters:  07/26/24 174 lb 9.6 oz (79.2 kg)  05/10/24 164 lb (74.4 kg)  03/24/24 162 lb 6.4 oz (73.7 kg)     GEN: He looks frail well nourished, well developed in no acute distress HEENT: Normal NECK: No JVD; No carotid bruits LYMPHATICS: No lymphadenopathy CARDIAC: 1/6 ejection murmur RRR, n RESPIRATORY:  Clear to auscultation without rales, wheezing or rhonchi  ABDOMEN: Soft, non-tender, non-distended MUSCULOSKELETAL:  No edema; No deformity  SKIN: Warm and dry NEUROLOGIC:  Alert and oriented x 3 PSYCHIATRIC:  Normal affect  Signed, Jason Leiter, MD  07/26/2024 1:47 PM    [facility]

## 2024-09-23 ENCOUNTER — Encounter (HOSPITAL_COMMUNITY): Payer: Self-pay | Admitting: *Deleted

## 2024-09-23 ENCOUNTER — Other Ambulatory Visit: Payer: Self-pay

## 2024-09-23 ENCOUNTER — Emergency Department (HOSPITAL_COMMUNITY)

## 2024-09-23 ENCOUNTER — Emergency Department (HOSPITAL_COMMUNITY)
Admission: EM | Admit: 2024-09-23 | Discharge: 2024-09-24 | Disposition: A | Attending: Emergency Medicine | Admitting: Emergency Medicine

## 2024-09-23 DIAGNOSIS — I132 Hypertensive heart and chronic kidney disease with heart failure and with stage 5 chronic kidney disease, or end stage renal disease: Secondary | ICD-10-CM | POA: Diagnosis not present

## 2024-09-23 DIAGNOSIS — E1122 Type 2 diabetes mellitus with diabetic chronic kidney disease: Secondary | ICD-10-CM | POA: Diagnosis not present

## 2024-09-23 DIAGNOSIS — I509 Heart failure, unspecified: Secondary | ICD-10-CM | POA: Insufficient documentation

## 2024-09-23 DIAGNOSIS — I4891 Unspecified atrial fibrillation: Secondary | ICD-10-CM | POA: Diagnosis not present

## 2024-09-23 DIAGNOSIS — R519 Headache, unspecified: Secondary | ICD-10-CM | POA: Diagnosis not present

## 2024-09-23 DIAGNOSIS — Z7982 Long term (current) use of aspirin: Secondary | ICD-10-CM | POA: Diagnosis not present

## 2024-09-23 DIAGNOSIS — I5032 Chronic diastolic (congestive) heart failure: Secondary | ICD-10-CM | POA: Insufficient documentation

## 2024-09-23 DIAGNOSIS — N185 Chronic kidney disease, stage 5: Secondary | ICD-10-CM | POA: Insufficient documentation

## 2024-09-23 DIAGNOSIS — R319 Hematuria, unspecified: Secondary | ICD-10-CM | POA: Diagnosis present

## 2024-09-23 DIAGNOSIS — J90 Pleural effusion, not elsewhere classified: Secondary | ICD-10-CM | POA: Diagnosis not present

## 2024-09-23 DIAGNOSIS — N390 Urinary tract infection, site not specified: Secondary | ICD-10-CM | POA: Insufficient documentation

## 2024-09-23 LAB — CBC WITH DIFFERENTIAL/PLATELET
Abs Immature Granulocytes: 0.03 K/uL (ref 0.00–0.07)
Basophils Absolute: 0 K/uL (ref 0.0–0.1)
Basophils Relative: 0 %
Eosinophils Absolute: 0.1 K/uL (ref 0.0–0.5)
Eosinophils Relative: 1 %
HCT: 30.6 % — ABNORMAL LOW (ref 39.0–52.0)
Hemoglobin: 9.6 g/dL — ABNORMAL LOW (ref 13.0–17.0)
Immature Granulocytes: 0 %
Lymphocytes Relative: 16 %
Lymphs Abs: 1.2 K/uL (ref 0.7–4.0)
MCH: 29.4 pg (ref 26.0–34.0)
MCHC: 31.4 g/dL (ref 30.0–36.0)
MCV: 93.6 fL (ref 80.0–100.0)
Monocytes Absolute: 0.6 K/uL (ref 0.1–1.0)
Monocytes Relative: 8 %
Neutro Abs: 5.9 K/uL (ref 1.7–7.7)
Neutrophils Relative %: 75 %
Platelets: 84 K/uL — ABNORMAL LOW (ref 150–400)
RBC: 3.27 MIL/uL — ABNORMAL LOW (ref 4.22–5.81)
RDW: 17.2 % — ABNORMAL HIGH (ref 11.5–15.5)
WBC: 7.9 K/uL (ref 4.0–10.5)
nRBC: 0 % (ref 0.0–0.2)

## 2024-09-23 LAB — COMPREHENSIVE METABOLIC PANEL WITH GFR
ALT: 42 U/L (ref 0–44)
AST: 40 U/L (ref 15–41)
Albumin: 3.8 g/dL (ref 3.5–5.0)
Alkaline Phosphatase: 112 U/L (ref 38–126)
Anion gap: 14 (ref 5–15)
BUN: 96 mg/dL — ABNORMAL HIGH (ref 8–23)
CO2: 23 mmol/L (ref 22–32)
Calcium: 9.2 mg/dL (ref 8.9–10.3)
Chloride: 102 mmol/L (ref 98–111)
Creatinine, Ser: 5.55 mg/dL — ABNORMAL HIGH (ref 0.61–1.24)
GFR, Estimated: 9 mL/min — ABNORMAL LOW
Glucose, Bld: 108 mg/dL — ABNORMAL HIGH (ref 70–99)
Potassium: 4.4 mmol/L (ref 3.5–5.1)
Sodium: 139 mmol/L (ref 135–145)
Total Bilirubin: 0.6 mg/dL (ref 0.0–1.2)
Total Protein: 6.3 g/dL — ABNORMAL LOW (ref 6.5–8.1)

## 2024-09-23 LAB — URINALYSIS, W/ REFLEX TO CULTURE (INFECTION SUSPECTED)
Bilirubin Urine: NEGATIVE
Glucose, UA: NEGATIVE mg/dL
Ketones, ur: NEGATIVE mg/dL
Nitrite: NEGATIVE
Protein, ur: 100 mg/dL — AB
RBC / HPF: >50 RBC/hpf (ref 0–5)
Specific Gravity, Urine: 1.01 (ref 1.005–1.030)
WBC, UA: >50 WBC/hpf (ref 0–5)
pH: 5 (ref 5.0–8.0)

## 2024-09-23 MED ORDER — SODIUM CHLORIDE 0.9 % IV SOLN
2.0000 g | Freq: Once | INTRAVENOUS | Status: AC
  Administered 2024-09-23: 2 g via INTRAVENOUS
  Filled 2024-09-23: qty 20

## 2024-09-23 MED ORDER — ACETAMINOPHEN 500 MG PO TABS
1000.0000 mg | ORAL_TABLET | Freq: Once | ORAL | Status: AC
  Administered 2024-09-23: 1000 mg via ORAL
  Filled 2024-09-23: qty 2

## 2024-09-23 NOTE — ED Provider Triage Note (Signed)
 Emergency Medicine Provider Triage Evaluation Note  English Jason Beltran , a 84 y.o. male  was evaluated in triage.  Pt complains of blood in urine, sent by nephrologist.  Currently in kidney failure  Review of Systems  Positive: Hematuria, dysuria, headache Negative: Fever, chills, nausea, vomiting, chest pain, shortness of breath  Physical Exam  BP (!) 127/56   Pulse 62   Temp 98.1 F (36.7 C)   Resp 20   SpO2 96%  Gen:   Awake, no distress  Resp:  Normal effort  MSK:   Moves extremities without difficulty  Other:    Medical Decision Making  Medically screening exam initiated at 3:16 PM.  Appropriate orders placed.  Jason Beltran was informed that the remainder of the evaluation will be completed by another provider, this initial triage assessment does not replace that evaluation, and the importance of remaining in the ED until their evaluation is complete.  Labs and imaging ordered   Jason Beltran 09/23/24 1523

## 2024-09-23 NOTE — ED Triage Notes (Signed)
 POV/ with family/ pt noticed blood in urine this/ sent by nephrologist, currently in kidney failure/ A&OX4

## 2024-09-23 NOTE — ED Provider Notes (Signed)
 " [facility] Provider Note   CSN: [id number] Arrival date & time: 09/23/24  1406     Patient presents with: Hematuria   Jason Beltran is a 84 y.o. male with history of CHF, A-fib, GERD, AKI, CKD stage V, diabetes, diastolic CHF, hyperparathyroidism, hypertension.  Presents to ED for evaluation of hematuria.  States that he noticed today that he had gross hematuria when he initially urinated this morning.  Reports over the course of the day his hematuria has progressively resolved.  Reports that in the waiting room he did urinate and states that he only had 2 drops of blood.  He also reports dysuria that has been ongoing for the last few days.  Denies any nausea, vomiting or abdominal pain he noticed at home.  Denies any flank pain.  Denies fevers.  Also complaining of headache which she reports began around the same time as his hematuria.  States that his wife gave him Tylenol , headache reduced.  Denies this being the worst headache of his life.  Denies blurred vision, neck pain, one-sided weakness or numbness.  Reports that he has CKD however does not do dialysis yet.  Reports that he is attempting to refrain from this.   Hematuria       Prior to Admission medications  Medication Sig Start Date End Date Taking? Authorizing Provider  aspirin  81 MG EC tablet Take 81 mg by mouth at bedtime.    [provider]  carvedilol  (COREG ) 6.25 MG tablet Take 6.25 mg by mouth 2 (two) times daily with a meal.    [provider]  Cholecalciferol (VITAMIN D3) 50 MCG (2000 UT) TABS Take 2,000 Units by mouth daily.    [provider]  Coenzyme Q10 (COQ10) 200 MG CAPS Take 200 mg by mouth daily.    [provider]  febuxostat  (ULORIC ) 40 MG tablet Take 20 mg by mouth daily.    [provider]  gabapentin  (NEURONTIN ) 100 MG capsule Take 100 mg by mouth 2 (two) times daily. 01/11/24   [provider]   hydrALAZINE  (APRESOLINE ) 50 MG tablet Take 50 mg by mouth in the morning and at bedtime.    [provider]  ipratropium (ATROVENT) 0.06 % nasal spray Place 2 sprays into both nostrils daily.    [provider]  Multiple Vitamin (MULTIVITAMIN) tablet Take 1 tablet by mouth daily.    [provider]  Resveratrol-Quercetin (RESVERATROL PLUS) 100-100 MG TABS Take 1 capsule by mouth daily.    [provider]  simvastatin  (ZOCOR ) 20 MG tablet Take 20 mg by mouth at bedtime.    [provider]  sodium bicarbonate  650 MG tablet Take 650 mg by mouth 2 (two) times daily. 02/12/24   [provider]  torsemide  (DEMADEX ) 20 MG tablet Take 2 tablets (40 mg total) by mouth daily. In case of weight gain 2 to 3 lbs in 24 hrs or 5 lbs in 7 days, take 3 tablets daily until weight back to baseline. Patient taking differently: Take 60 mg by mouth daily. In case of weight gain 2 to 3 lbs in 24 hrs or 5 lbs in 7 days, take 3 tablets daily until weight back to baseline. 03/25/24   Arrien, Elidia Sieving, MD    Allergies: Actos [pioglitazone], Lisinopril, Motrin [ibuprofen], and Trilipix [choline fenofibrate]    Review of Systems  Genitourinary:  Positive for hematuria.    Updated Vital Signs BP (!) 152/87  Pulse 68   Temp 98.6 F (37 C)   Resp 14   Ht 5' 6 (1.676 m)   Wt 79.2 kg   SpO2 98%   BMI 28.18 kg/m   Physical Exam  (all labs ordered are listed, but only abnormal results are displayed) Labs Reviewed  COMPREHENSIVE METABOLIC PANEL WITH GFR - Abnormal; Notable for the following components:      Result Value   Glucose, Bld 108 (*)    BUN 96 (*)    Creatinine, Ser 5.55 (*)    Total Protein 6.3 (*)    GFR, Estimated 9 (*)    All other components within normal limits  CBC WITH DIFFERENTIAL/PLATELET - Abnormal; Notable for the following components:   RBC 3.27 (*)    Hemoglobin 9.6 (*)    HCT 30.6 (*)    RDW 17.2 (*)    Platelets 84 (*)     All other components within normal limits  URINALYSIS, W/ REFLEX TO CULTURE (INFECTION SUSPECTED) - Abnormal; Notable for the following components:   APPearance CLOUDY (*)    Hgb urine dipstick LARGE (*)    Protein, ur 100 (*)    Leukocytes,Ua MODERATE (*)    Bacteria, UA RARE (*)    All other components within normal limits  URINE CULTURE    EKG: None  Radiology: CT Head Wo Contrast Result Date: 09/23/2024 CLINICAL DATA:  Headache since this morning EXAM: CT HEAD WITHOUT CONTRAST TECHNIQUE: Contiguous axial images were obtained from the base of the skull through the vertex without intravenous contrast. RADIATION DOSE REDUCTION: This exam was performed according to the departmental dose-optimization program which includes automated exposure control, adjustment of the mA and/or kV according to patient size and/or use of iterative reconstruction technique. COMPARISON:  02/16/2023 FINDINGS: Brain: No acute infarct or hemorrhage. Lateral ventricles and midline structures are unremarkable. No acute extra-axial fluid collections. No mass effect. Vascular: No hyperdense vessel or unexpected calcification. Skull: Normal. Negative for fracture or focal lesion. Sinuses/Orbits: No acute finding. Other: None. IMPRESSION: 1. No acute intracranial process. Electronically Signed   By: Ozell Daring M.D.   On: 09/23/2024 18:18    {Document cardiac monitor, telemetry assessment procedure when appropriate:32947} Procedures   Medications Ordered in the ED  cefTRIAXone  (ROCEPHIN ) 2 g in sodium chloride  0.9 % 100 mL IVPB (has no administration in time range)  acetaminophen  (TYLENOL ) tablet 1,000 mg (has no administration in time range)      {Click here for ABCD2, HEART and other calculators REFRESH Note before signing:1}                              Medical Decision Making Amount and/or Complexity of Data Reviewed Radiology: ordered.  Risk OTC drugs.   ***  {Document critical care time when  appropriate  Document review of labs and clinical decision tools ie CHADS2VASC2, etc  Document your independent review of radiology images and any outside records  Document your discussion with family members, caretakers and with consultants  Document social determinants of health affecting pt's care  Document your decision making why or why not admission, treatments were needed:32947:::1}   Final diagnoses:  None    ED Discharge Orders     None        "

## 2024-09-23 NOTE — ED Notes (Signed)
 Provider bedside.

## 2024-09-23 NOTE — ED Triage Notes (Signed)
 Dark red c blood since this am  he has a headache only at present he just does not feel good

## 2024-09-23 NOTE — ED Notes (Signed)
 Pt to CT

## 2024-09-24 ENCOUNTER — Emergency Department (HOSPITAL_COMMUNITY)

## 2024-09-24 DIAGNOSIS — N390 Urinary tract infection, site not specified: Secondary | ICD-10-CM | POA: Diagnosis not present

## 2024-09-24 MED ORDER — CEPHALEXIN 500 MG PO CAPS: 500.0000 mg | ORAL_CAPSULE | Freq: Four times a day (QID) | ORAL | 0 refills | Status: AC

## 2024-09-24 MED ORDER — TORSEMIDE 20 MG PO TABS: 40.0000 mg | ORAL_TABLET | Freq: Two times a day (BID) | ORAL | 0 refills | Status: AC

## 2024-09-24 NOTE — Discharge Instructions (Addendum)
 As we discussed, your blood in urine is most likely due to having a UTI.  Please begin taking Keflex  4 times a day for 7 days.  Please follow-up with nephrology team for progressively worsening renal function.  Please double up on your torsemide  by taking 40 mg twice a day until seen by nephrology.  If you develop fevers, shortness of breath, nausea or vomiting please return to the ED.

## 2024-09-26 LAB — URINE CULTURE: Culture: >=100000 — AB

## 2024-09-27 ENCOUNTER — Telehealth (HOSPITAL_BASED_OUTPATIENT_CLINIC_OR_DEPARTMENT_OTHER): Payer: Self-pay | Admitting: *Deleted

## 2024-09-27 NOTE — Telephone Encounter (Signed)
 Post ED Visit - Positive Culture Follow-up  Culture report reviewed by antimicrobial stewardship pharmacist: [facility] [x]  Leonor Bash, [place].D. []  Venetia Gully, Pharm.D., BCPS AQ-ID []  Garrel Crews, Pharm.D., BCPS []  Almarie Lunger, Pharm.D., BCPS []  [place], [place].D., BCPS, AAHIVP []  Rosaline Bihari, Pharm.D., BCPS, AAHIVP []  Vernell Meier, PharmD, BCPS []  Latanya Hint, PharmD, BCPS []  Donald Medley, PharmD, BCPS []  Rocky Bold, PharmD []  Dorothyann Alert, PharmD, BCPS []  Morene Babe, PharmD  [facility] []  Rosaline Edison, PharmD []  Romona Bliss, PharmD []  Dolphus Roller, PharmD []  Veva Seip, Rph []  Vernell Daunt) Leonce, PharmD []  Eva Allis, PharmD []  Rosaline Millet, PharmD []  Iantha Batch, PharmD []  Arvin Gauss, PharmD []  Wanda Hasting, PharmD []  Ronal Rav, PharmD []  Rocky Slade, PharmD []  Bard Jeans, PharmD   Positive urine culture Treated with Cephalexin , organism sensitive to the same and no further patient follow-up is required at this time.  Jason Beltran 09/27/2024, 1:21 PM
